# Patient Record
Sex: Male | Born: 1960 | Race: White | Hispanic: No | Marital: Married | Smoking: Never smoker
Health system: Southern US, Community
[De-identification: ages and names within clinical notes are randomized; demographics above are authoritative.]

## PROBLEM LIST (undated history)

## (undated) DIAGNOSIS — G4730 Sleep apnea, unspecified: Secondary | ICD-10-CM

## (undated) DIAGNOSIS — I10 Essential (primary) hypertension: Secondary | ICD-10-CM

## (undated) DIAGNOSIS — K2270 Barrett's esophagus without dysplasia: Secondary | ICD-10-CM

## (undated) DIAGNOSIS — J189 Pneumonia, unspecified organism: Secondary | ICD-10-CM

## (undated) DIAGNOSIS — E119 Type 2 diabetes mellitus without complications: Secondary | ICD-10-CM

## (undated) DIAGNOSIS — K219 Gastro-esophageal reflux disease without esophagitis: Secondary | ICD-10-CM

## (undated) HISTORY — DX: Gastro-esophageal reflux disease without esophagitis: K21.9

## (undated) HISTORY — DX: Type 2 diabetes mellitus without complications: E11.9

## (undated) HISTORY — DX: Morbid (severe) obesity due to excess calories: E66.01

---

## 1991-02-14 HISTORY — PX: OTHER SURGICAL HISTORY: SHX169

## 1999-02-14 HISTORY — PX: CHOLECYSTECTOMY: SHX55

## 2000-02-14 HISTORY — PX: CATARACT EXTRACTION, BILATERAL: SHX1313

## 2004-02-22 ENCOUNTER — Inpatient Hospital Stay: Payer: Self-pay | Admitting: Internal Medicine

## 2006-02-13 HISTORY — PX: LAPAROSCOPIC GASTRIC BANDING: SHX1100

## 2006-07-01 ENCOUNTER — Emergency Department: Payer: Self-pay | Admitting: Emergency Medicine

## 2010-02-13 HISTORY — PX: JOINT REPLACEMENT: SHX530

## 2010-04-25 ENCOUNTER — Ambulatory Visit: Payer: Self-pay | Admitting: Specialist

## 2010-05-02 ENCOUNTER — Ambulatory Visit: Payer: Self-pay | Admitting: Specialist

## 2010-05-23 ENCOUNTER — Ambulatory Visit: Payer: Self-pay | Admitting: Gastroenterology

## 2010-05-23 LAB — HM COLONOSCOPY

## 2011-03-31 ENCOUNTER — Encounter (INDEPENDENT_AMBULATORY_CARE_PROVIDER_SITE_OTHER): Payer: Self-pay | Admitting: Surgery

## 2011-03-31 ENCOUNTER — Ambulatory Visit (INDEPENDENT_AMBULATORY_CARE_PROVIDER_SITE_OTHER): Payer: BLUE CROSS/BLUE SHIELD | Admitting: Surgery

## 2011-03-31 VITALS — BP 162/92 | HR 70 | Temp 96.8°F | Resp 18 | Ht 76.0 in | Wt 340.2 lb

## 2011-03-31 DIAGNOSIS — Z9884 Bariatric surgery status: Secondary | ICD-10-CM

## 2011-03-31 DIAGNOSIS — E669 Obesity, unspecified: Secondary | ICD-10-CM

## 2011-03-31 DIAGNOSIS — E119 Type 2 diabetes mellitus without complications: Secondary | ICD-10-CM

## 2011-03-31 DIAGNOSIS — Z9089 Acquired absence of other organs: Secondary | ICD-10-CM

## 2011-03-31 DIAGNOSIS — I2699 Other pulmonary embolism without acute cor pulmonale: Secondary | ICD-10-CM

## 2011-03-31 DIAGNOSIS — Z9049 Acquired absence of other specified parts of digestive tract: Secondary | ICD-10-CM

## 2011-03-31 DIAGNOSIS — I456 Pre-excitation syndrome: Secondary | ICD-10-CM | POA: Insufficient documentation

## 2011-03-31 NOTE — Progress Notes (Signed)
Addended by: Latricia Heft on: 03/31/2011 02:05 PM   Modules accepted: Orders

## 2011-03-31 NOTE — Progress Notes (Signed)
Chief Complaint:  Diabetes Mellitus unresponsive to lapband.  Desires conversion to Roux en Y gastric bypass  History of Present Illness:  Howard Young is an 51 y.o. 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 He lost about 50 pounds initially but has failed to lose further weight. This is not help his diabetes resolve. Because of his frustration with weight loss and more importantly that his diabetes has not resolved he has wanted to have a gastric bypass. His primary care physician is Dr.Nancy Malmey.  He's is well aware of the procedure and its risks. He is going for a long-arm appropriate we'll process with [employer] but had lost some confidence in the group he was seen and [employer]. His procedure was done December 2008. His main issues in addition to diabetes include high cholesterol joint pain status post right hip replacement and hypertension.  History reviewed. No pertinent past medical history.  Past Surgical History  Procedure Date  . Cholecystectomy 2001  . Joint replacement 2012    right hip  . Laparoscopic gastric banding 2008  . Heart ablation 1993    Current Outpatient Prescriptions  Medication Sig Dispense Refill  . Aliskiren Fumarate (TEKTURNA PO) Take by mouth daily. Patient unsure of dosage.      Marland Kitchen atorvastatin (LIPITOR) 40 MG tablet Take 40 mg by mouth daily.      . insulin aspart (NOVOLOG) 100 UNIT/ML injection Inject into the skin 3 (three) times daily before meals. Patient taking 30 - 60 units daily      . Liraglutide (VICTOZA [place]) Inject into the skin daily.      . metFORMIN (GLUCOPHAGE) 500 MG tablet Take 500 mg by mouth 2 (two) times daily with a meal.      . Multiple Vitamins-Minerals (MULTIVITAMIN PO) Take by mouth daily.      . pantoprazole (PROTONIX) 40 MG tablet Daily.       Reglan Family History  Problem Relation Age of Onset  . Cancer Mother     breast, lung  . Cancer Father     colon  . Cancer  Sister     breast  . Cancer Sister     ovarian   Social History:   reports that he has never smoked. He has never used smokeless tobacco. He reports that he does not drink alcohol or use illicit drugs.   REVIEW OF SYSTEMS - PERTINENT POSITIVES ONLY: Noncontributory except as noted above.  Physical Exam:   Blood pressure 162/92, pulse 70, temperature 96.8 F (36 C), temperature source Temporal, resp. rate 18, height 6\' 4"  (1.93 m), weight 340 lb 3.2 oz (154.314 kg). Body mass index is 41.41 kg/(m^2).  Gen:  WDWN white male NAD  Neurological: Alert and oriented to person, place, and time. Motor and sensory function is grossly intact  Head: Normocephalic and atraumatic.  Eyes: Conjunctivae are normal. Pupils are equal, round, and reactive to light. No scleral icterus.  Neck: Normal range of motion. Neck supple. No tracheal deviation or thyromegaly present.  Cardiovascular:  SR without murmurs or gallops.  No carotid bruits Respiratory: Effort normal.  No respiratory distress. No chest wall tenderness. Breath sounds normal.  No wheezes, rales or rhonchi.  Abdomen:  Well-healed incisions. Lapband port implanted on fascia. GU: Musculoskeletal: Normal range of motion. Extremities are nontender. No cyanosis, edema or clubbing noted Lymphadenopathy: No cervical, preauricular, postauricular or axillary adenopathy is present Skin: Skin is  warm and dry. No rash noted. No diaphoresis. No erythema. No pallor. Pscyh: Normal mood and affect. Behavior is normal. Judgment and thought content normal.   LABORATORY RESULTS: No results found for this or any previous visit (from the past 48 hour(s)).  RADIOLOGY RESULTS: No results found.  Problem List: Patient Active Problem List  Diagnoses  . Obesity-BMI 41  . Status post gastric banding-APL 2008  . Pulmonary embolism-2005  . Diabetes mellitus-Type II  . WPW (Wolff-Parkinson-White syndrome)-cardiac ablation 1993  . S/P cholecystectomy-2001     Assessment & Plan: Morbid obesity and diabetes. Will plan to move him through our system toward Roux-en-Y gastric bypass.    [name], MD, [facility] [phone] beeper [phone]  03/31/2011 10:23 AM

## 2011-03-31 NOTE — Patient Instructions (Signed)
Followup with Howard Young for scheduling tests

## 2011-03-31 NOTE — Progress Notes (Signed)
Addended by: Latricia Heft on: 03/31/2011 10:42 AM   Modules accepted: Orders

## 2011-04-25 ENCOUNTER — Encounter: Payer: Self-pay | Admitting: *Deleted

## 2011-04-25 ENCOUNTER — Encounter (HOSPITAL_COMMUNITY)
Admission: RE | Disposition: A | Discharge status: Home Health | Payer: Self-pay | Source: Ambulatory Visit | Attending: Surgery

## 2011-04-25 ENCOUNTER — Ambulatory Visit (HOSPITAL_COMMUNITY)
Admission: RE | Admit: 2011-04-25 | Discharge: 2011-04-25 | Disposition: A | Discharge status: Home / Self Care | Payer: BC Managed Care – PPO | Source: Ambulatory Visit | Attending: Surgery | Admitting: Surgery

## 2011-04-25 ENCOUNTER — Ambulatory Visit (HOSPITAL_COMMUNITY)
Admission: RE | Admit: 2011-04-25 | Discharge: 2011-04-25 | Disposition: A | Discharge status: Home Health | Payer: BC Managed Care – PPO | Source: Ambulatory Visit | Attending: Surgery | Admitting: Surgery

## 2011-04-25 ENCOUNTER — Encounter: Payer: BC Managed Care – PPO | Attending: Surgery | Admitting: *Deleted

## 2011-04-25 DIAGNOSIS — I2699 Other pulmonary embolism without acute cor pulmonale: Secondary | ICD-10-CM

## 2011-04-25 DIAGNOSIS — Z713 Dietary counseling and surveillance: Secondary | ICD-10-CM | POA: Insufficient documentation

## 2011-04-25 DIAGNOSIS — Z01818 Encounter for other preprocedural examination: Secondary | ICD-10-CM | POA: Insufficient documentation

## 2011-04-25 DIAGNOSIS — Z01810 Encounter for preprocedural cardiovascular examination: Secondary | ICD-10-CM

## 2011-04-25 DIAGNOSIS — Z9884 Bariatric surgery status: Secondary | ICD-10-CM

## 2011-04-25 HISTORY — PX: BREATH TEK H PYLORI: SHX5422

## 2011-04-25 SURGERY — BREATH TEST, FOR HELICOBACTER PYLORI

## 2011-04-25 NOTE — Patient Instructions (Signed)
   Follow Pre-Op Nutrition Goals to prepare for Gastric Bypass Surgery.   Call the [facility] at [phone] once you have been given your surgery date to enrolled in the Pre-Op Nutrition Class. You will need to attend this nutrition class 3-4 weeks prior to your surgery. 

## 2011-04-25 NOTE — Progress Notes (Signed)
Bilateral lower extremity venous duplex completed.  Preliminary report is negative for DVT, SVT, or a Baker's cyst. 

## 2011-04-25 NOTE — Progress Notes (Signed)
  Pre-Op Assessment Visit:  Pre-Operative Gastric Bypass Surgery  Medical Nutrition Therapy:  Appt start time: 0915 end time:  1015.  Patient was seen on 04/25/2011 for Pre-Operative Gastric Bypass Nutrition Assessment. Assessment and letter of approval faxed to [facility] Bariatric Surgery Program coordinator on 04/25/2011.  Approval letter sent to [facility] and will be available in the chart under the media tab.  TANITA  BODY COMP RESULTS  04/25/11     %Fat 36.6%     FM (lbs) 123.5     FFM (lbs) 214.0     TBW (lbs) 156.6      Handouts given during visit include:  Pre-Op Goals Handout  Bariatric Protein Shakes handout  Bariatric Support Group schedule  Patient to call for Pre-Op and Post-Op Nutrition Education at the [facility] when surgery is scheduled.

## 2011-04-26 ENCOUNTER — Encounter (HOSPITAL_COMMUNITY): Payer: Self-pay | Admitting: Surgery

## 2011-11-04 ENCOUNTER — Ambulatory Visit: Payer: Self-pay | Admitting: Orthopedic Surgery

## 2011-11-29 ENCOUNTER — Encounter (INDEPENDENT_AMBULATORY_CARE_PROVIDER_SITE_OTHER): Payer: Self-pay | Admitting: Surgery

## 2011-11-29 ENCOUNTER — Ambulatory Visit (INDEPENDENT_AMBULATORY_CARE_PROVIDER_SITE_OTHER): Payer: BC Managed Care – PPO | Admitting: Surgery

## 2011-11-29 VITALS — BP 180/88 | HR 88 | Temp 97.4°F | Resp 20 | Ht 76.0 in | Wt 345.4 lb

## 2011-11-29 DIAGNOSIS — K9509 Other complications of gastric band procedure: Secondary | ICD-10-CM

## 2011-11-29 NOTE — Patient Instructions (Signed)
Thanks for your patience.  If you need further assistance after leaving the office, please call our office and speak with a [facility] nurse.  [phone].  If you want to leave a message for Dr. Reene Harlacher, please call his office phone at [phone]. 

## 2011-11-29 NOTE — Progress Notes (Signed)
Howard Young Body mass index is 42.04 kg/(m^2).  Having regurgitation:  no  Nocturnal reflux?  no  Amount of fill  1 cc  Howard Young returns today to discuss his lack of progress with weight loss and his lap band. When he began the process with Dr. Arlyce Dice he weighed over 385. He lost about 50 pounds and currently is tedious about 345. His diabetes is still a problem which he said since 2002. He works out at [employer] frequently including [employer] in  [place] and [employer] in [place] as well.  He has been frustrated with his inability to get his weight down and lose his diabetes mellitus as well.  We're seeking to get  insurance  Approval for removal of his lap band and conversion to a Roux-en-Y gastric bypass. I will try to get a letter to [facility] requesting a doctor to doctor discussion about this.

## 2012-01-04 ENCOUNTER — Ambulatory Visit (INDEPENDENT_AMBULATORY_CARE_PROVIDER_SITE_OTHER): Payer: BC Managed Care – PPO | Admitting: Surgery

## 2012-01-04 ENCOUNTER — Encounter (INDEPENDENT_AMBULATORY_CARE_PROVIDER_SITE_OTHER): Payer: Self-pay | Admitting: Surgery

## 2012-01-04 VITALS — BP 160/120 | HR 60 | Temp 97.3°F | Resp 12 | Ht 76.0 in | Wt 337.0 lb

## 2012-01-04 DIAGNOSIS — E8881 Metabolic syndrome: Secondary | ICD-10-CM | POA: Insufficient documentation

## 2012-01-04 NOTE — Patient Instructions (Signed)
Document gymnasium use. Printout online calorie counting if possible Ask Dr. Lorie Phenix to address your metabolic syndrome and the need for gastric bypass to specifically help in bringing resolution to this comorbidity

## 2012-01-04 NOTE — Progress Notes (Signed)
Howard Young presents today somewhat frustrated is tense to undergo a laparoscopic Roux-en-Y gastric bypass. His weight today is 337 and he has maintained about 50 pound weight loss since his surgery by Dr. Arlyce Dice. However despite his weight loss down to a BMI of 41 he still has metabolic syndrome. He has away circumference greater than 42 inches, hypertension (BP 160/120 today), and most importantly type 2 diabetes. I think a Roux-en-Y gastric bypass is a good operation for this condition specifically the type 2 diabetes. The new NIH. Guidelines issued last week indicates such and we will try to proceed to have him approved for Roux-en-Y gastric bypass as a metabolic operation to try to help control his diabetes mellitus. He is to see Dr. Lorie Phenix tomorrow and I wait to hear whether she concurs and this effort.  He may need more aggressive management of his hypertension.    Impression:  Metabolic syndrome with insufficient weight loss with laparoscopic gastric banding.

## 2012-02-14 HISTORY — PX: MOHS SURGERY: SUR867

## 2012-02-16 ENCOUNTER — Ambulatory Visit (INDEPENDENT_AMBULATORY_CARE_PROVIDER_SITE_OTHER): Payer: BC Managed Care – PPO | Admitting: Surgery

## 2012-02-16 ENCOUNTER — Encounter (INDEPENDENT_AMBULATORY_CARE_PROVIDER_SITE_OTHER): Payer: Self-pay | Admitting: Surgery

## 2012-02-16 VITALS — BP 132/88 | HR 72 | Temp 98.4°F | Resp 18 | Ht 76.0 in | Wt 340.8 lb

## 2012-02-16 DIAGNOSIS — E8881 Metabolic syndrome: Secondary | ICD-10-CM

## 2012-02-16 NOTE — Progress Notes (Signed)
Howard Young 52 y.o.  Body mass index is 41.48 kg/(m^2).  Patient Active Problem List  Diagnosis  . Obesity-BMI 41  . Status post gastric banding-APL 2008  . Pulmonary embolism-2005  . Diabetes mellitus-Type II-since 2001  . WPW (Wolff-Parkinson-White syndrome)-cardiac ablation 1993  . S/P cholecystectomy-2001  . Complication of gastric banding-insufficient weight loss  . Metabolic syndrome    Allergies  Allergen Reactions  . Metoclopramide Hcl Shortness Of Breath and Anxiety    Past Surgical History  Procedure Date  . Cholecystectomy 2001  . Joint replacement 2012    right hip  . Laparoscopic gastric banding 2008  . Heart ablation 1993  . Breath tek h pylori 04/25/2011    Procedure: BREATH TEK H PYLORI;  Surgeon: Valarie Merino, MD;  Location: [facility];  Service: General;  Laterality: N/ALorie Phenix, MD No diagnosis found.  Mr. Sparling needs gastric bypass surgery to deal with his DM and metabolic syndrome.  I have refilled his band and his weight has not changed.  He is clearly a lapband failure.    Will resubmit this request to his insurance for approval for removal of his lapband and  Conversion to Roux Y gastric bypass.     [name], MD, [facility] [phone] beeper [phone]  02/16/2012 5:38 PM

## 2012-02-16 NOTE — Patient Instructions (Signed)
Will submit request for approval for Roux Y gastric bypass conversion

## 2012-03-19 ENCOUNTER — Ambulatory Visit: Payer: Self-pay | Admitting: Gastroenterology

## 2012-03-29 ENCOUNTER — Ambulatory Visit: Payer: Self-pay | Admitting: Gastroenterology

## 2012-04-01 LAB — PATHOLOGY REPORT

## 2012-06-21 ENCOUNTER — Inpatient Hospital Stay: Payer: Self-pay | Admitting: Specialist

## 2012-06-21 LAB — CBC
HCT: 48.2 % (ref 40.0–52.0)
MCHC: 34.1 g/dL (ref 32.0–36.0)
MCV: 82 fL (ref 80–100)
RDW: 12.9 % (ref 11.5–14.5)

## 2012-06-21 LAB — COMPREHENSIVE METABOLIC PANEL
Albumin: 3.8 g/dL (ref 3.4–5.0)
Anion Gap: 9 (ref 7–16)
Calcium, Total: 9.5 mg/dL (ref 8.5–10.1)
Chloride: 100 mmol/L (ref 98–107)
Creatinine: 1.39 mg/dL — ABNORMAL HIGH (ref 0.60–1.30)
EGFR (African American): >60
EGFR (Non-African Amer.): 57 — ABNORMAL LOW
Glucose: 248 mg/dL — ABNORMAL HIGH (ref 65–99)
Osmolality: 278 (ref 275–301)
Potassium: 4.2 mmol/L (ref 3.5–5.1)
SGOT(AST): 65 U/L — ABNORMAL HIGH (ref 15–37)
SGPT (ALT): 85 U/L — ABNORMAL HIGH (ref 12–78)
Sodium: 134 mmol/L — ABNORMAL LOW (ref 136–145)
Total Protein: 8.4 g/dL — ABNORMAL HIGH (ref 6.4–8.2)

## 2012-06-21 LAB — URINALYSIS, COMPLETE
Bilirubin,UR: NEGATIVE
Glucose,UR: >=500 mg/dL (ref 0–75)
Ketone: NEGATIVE
Nitrite: NEGATIVE
Ph: 5 (ref 4.5–8.0)
Protein: NEGATIVE
RBC,UR: <1 /HPF (ref 0–5)
Squamous Epithelial: <1

## 2012-06-22 LAB — BASIC METABOLIC PANEL
Calcium, Total: 8.5 mg/dL (ref 8.5–10.1)
Co2: 28 mmol/L (ref 21–32)
EGFR (African American): >60
EGFR (Non-African Amer.): 53 — ABNORMAL LOW
Glucose: 197 mg/dL — ABNORMAL HIGH (ref 65–99)
Osmolality: 277 (ref 275–301)
Potassium: 4 mmol/L (ref 3.5–5.1)
Sodium: 135 mmol/L — ABNORMAL LOW (ref 136–145)

## 2012-06-22 LAB — URINE CULTURE

## 2012-06-22 LAB — CBC WITH DIFFERENTIAL/PLATELET
Basophil #: 0.1 10*3/uL (ref 0.0–0.1)
Basophil %: 0.4 %
Eosinophil %: 0.1 %
HCT: 42 % (ref 40.0–52.0)
Lymphocyte %: 4.8 %
MCH: 28.6 pg (ref 26.0–34.0)
MCV: 82 fL (ref 80–100)
Monocyte #: 1.8 x10 3/mm — ABNORMAL HIGH (ref 0.2–1.0)
Monocyte %: 7.2 %
Neutrophil #: 21.3 10*3/uL — ABNORMAL HIGH (ref 1.4–6.5)
Platelet: 166 10*3/uL (ref 150–440)
RBC: 5.11 10*6/uL (ref 4.40–5.90)
RDW: 13 % (ref 11.5–14.5)

## 2012-06-22 LAB — CREATININE, SERUM: EGFR (Non-African Amer.): 57 — ABNORMAL LOW

## 2012-06-23 LAB — BASIC METABOLIC PANEL
Anion Gap: 5 — ABNORMAL LOW (ref 7–16)
BUN: 11 mg/dL (ref 7–18)
Calcium, Total: 8.4 mg/dL — ABNORMAL LOW (ref 8.5–10.1)
Chloride: 105 mmol/L (ref 98–107)
Co2: 26 mmol/L (ref 21–32)
Creatinine: 1.19 mg/dL (ref 0.60–1.30)
EGFR (African American): >60
EGFR (Non-African Amer.): >60
Glucose: 158 mg/dL — ABNORMAL HIGH (ref 65–99)
Osmolality: 275 (ref 275–301)
Potassium: 3.7 mmol/L (ref 3.5–5.1)
Sodium: 136 mmol/L (ref 136–145)

## 2012-06-23 LAB — CBC WITH DIFFERENTIAL/PLATELET
Basophil #: 0.1 10*3/uL (ref 0.0–0.1)
Eosinophil %: 1 %
HGB: 14.1 g/dL (ref 13.0–18.0)
Lymphocyte #: 1.7 10*3/uL (ref 1.0–3.6)
MCHC: 34.3 g/dL (ref 32.0–36.0)
MCV: 82 fL (ref 80–100)
Monocyte #: 1.9 x10 3/mm — ABNORMAL HIGH (ref 0.2–1.0)
Monocyte %: 13.3 %
Neutrophil #: 10.3 10*3/uL — ABNORMAL HIGH (ref 1.4–6.5)
Platelet: 155 10*3/uL (ref 150–440)
RBC: 5.02 10*6/uL (ref 4.40–5.90)
RDW: 13 % (ref 11.5–14.5)
WBC: 14.1 10*3/uL — ABNORMAL HIGH (ref 3.8–10.6)

## 2012-06-24 LAB — CBC WITH DIFFERENTIAL/PLATELET
Basophil #: 0.1 10*3/uL (ref 0.0–0.1)
Basophil %: 0.6 %
Eosinophil #: 0.2 10*3/uL (ref 0.0–0.7)
HGB: 14.8 g/dL (ref 13.0–18.0)
Lymphocyte #: 1.6 10*3/uL (ref 1.0–3.6)
MCH: 28.6 pg (ref 26.0–34.0)
MCHC: 35.1 g/dL (ref 32.0–36.0)
MCV: 81 fL (ref 80–100)
Monocyte #: 1.3 x10 3/mm — ABNORMAL HIGH (ref 0.2–1.0)
Platelet: 179 10*3/uL (ref 150–440)
RDW: 12.9 % (ref 11.5–14.5)

## 2012-06-27 LAB — CULTURE, BLOOD (SINGLE)

## 2012-07-09 ENCOUNTER — Encounter (INDEPENDENT_AMBULATORY_CARE_PROVIDER_SITE_OTHER): Payer: Self-pay

## 2013-06-10 DIAGNOSIS — E291 Testicular hypofunction: Secondary | ICD-10-CM | POA: Insufficient documentation

## 2014-06-05 NOTE — Discharge Summary (Signed)
PATIENT NAME:  Howard Young, Howard Young MR#:  [id number] DATE OF BIRTH:  Sep 05, 1960  DATE OF ADMISSION:  06/21/2012 DATE OF DISCHARGE:  06/24/2012  For a detailed note, please take a look at the history and physical done on admission.   DIAGNOSES AT DISCHARGE: As follows: 1.  Acute left lower extremity cellulitis.  2.  Systemic inflammatory response syndrome secondary to the cellulitis.  3.  Diabetes. 4.  Acute renal failure. 5.  Hypertension.   DIET: The patient is being discharged on American diabetic Association, low sodium, low fat diet.   ACTIVITY: As tolerated.   FOLLOWUP: With Dr. Margarita Rana in the next 3 to 4 days.  DISCHARGE MEDICATIONS:  Tekturna 300 mg daily, Aldactone 50 mg b.i.d., Protonix 40 mg daily, metformin 1000 mg b.i.d., Lantus 30 units daily, Victoza 1.8 mg subcutaneous daily, vitamin B12 1000 mcg b.i.d., multivitamin daily, ginko biloba supplement 120 mg daily, St. John's wort 300 mg daily, glucosamine hydrochloride 1500 mg daily and Keflex 5 mg t.i.d. x 10 days.   PERTINENT STUDIES DONE DURING THE HOSPITAL COURSE: As follows: A Doppler of the left lower extremity showing no evidence of any acute DVT. Blood cultures noted to be negative.   HOSPITAL COURSE: This is a 54 year old male, who presented to the hospital with left lower extremity pain, redness and swelling and also low-grade fever and tachycardia.  1.  Acute left lower extremity cellulitis. This was likely the cause of the patient's redness, swelling and pain. The patient has a history of previous DVT and PE, therefore did undergo a Doppler of his lower extremity to rule out a DVT. The patient empirically was started on IV vancomycin. Blood cultures were obtained. The patient's clinical symptoms over the next 2 or 3  days have significantly improved. The tenderness and the warmth to the area has improved. The redness is still persistent though. His white cell count has completely normalized with IV antibiotic  therapy. His blood cultures have remained negative. One of his blood cultures grew out gram-positive cocci, but it was a coagulase-negative staph, which is a skin contaminant. The patient presently is clinically doing better with a normal white cell count and afebrile in the past 24 hours and is being discharged on p.o. Keflex for the next 10 days.  2.  Systemic inflammatory response syndrome. This was likely secondary to the left lower extremity cellulitis. The patient presented with rigors, tachycardia and fever. The patient's blood cultures have been consistent with only a contaminant. He has clinically improved a lot and is presently hemodynamically stable. He was empirically treated with IV vancomycin and is currently being discharged on p.o. Keflex as stated.  3.  Diabetes. The patient's blood sugars remained stable in the hospital. He was maintained on his Victoza, Lantus and sliding scale insulin. His metformin was held as he was in mild acute renal failure, although he can resume that upon discharge as his renal function has improved.  4.  Acute renal failure. This was likely secondary to dehydration and from the systemic inflammatory response syndrome. After getting aggressive IV fluids, the patient's renal function is now back down to baseline. His Aldactone and metformin were held upon discharge, although he will resume those since his renal function has come back to baseline.  5.  Hypertension. The patient was maintained on his Marisa Severin and will continue that.  6.  GERD. The patient was maintained on his Protonix. He will resume that. 7.  Leukocytosis. This was likely secondary to the  left lower extremity cellulitis. The patient's white cell count has normalized with IV antibiotic therapy.   CODE STATUS: The patient is a full code.   DISPOSITION: He is being discharged home.   TIME SPENT ON DISCHARGE: 40 minutes.  ____________________________ Belia Heman. Verdell Carmine, MD vjs:aw D: 06/24/2012  16:12:19 ET T: 06/25/2012 07:55:29 ET JOB#: [id number]  cc: Belia Heman. Verdell Carmine, MD, <Dictator> Jerrell Belfast, MD Henreitta Leber MD ELECTRONICALLY SIGNED 07/02/2012 19:58

## 2014-06-05 NOTE — H&P (Signed)
PATIENT NAME:  Howard Young, Howard Young MR#:  [id number] DATE OF BIRTH:  Sep 13, 1960  DATE OF ADMISSION:  06/21/2012  PRIMARY CARE PHYSICIAN: Margarita Rana, MD   CHIEF COMPLAINT: Left lower extremity pain and redness.   HISTORY OF PRESENT ILLNESS: This is a 54 year old male who presented to the hospital with left lower extremity redness and pain that started this morning. The patient says that when he woke up out of bed his left felt a little bit painful and also warm to touch. A few years back, the patient had a cramp in his right lower extremity and then he became acutely short of breath at that time. He was at that time diagnosed with a DVT and also an acute pulmonary embolism. He was a bit concerned, therefore came to the ER for further evaluation. At triage, the patient was noted to be tachycardic and he was also having some rigors.  He was noted to have a left lower extremity cellulitic-looking rash, noted to have a leukocytosis. Hospitalist services were contacted for further treatment and evaluation. The patient presently does complain of left lower extremity redness and pain, but no fevers, but positive rigors.  No nausea, no vomiting, no chest pain, no shortness of breath, no cough, no headache and no other associated symptoms presently.   REVIEW OF SYSTEMS: CONSTITUTIONAL: No documented fever, no weight gain, and no weight loss.  EYES: No blurred or double vision.  ENT: No tinnitus. No postnasal drip. No redness of the oropharynx.  RESPIRATORY: No cough, no wheeze, no hemoptysis, and no dyspnea.  CARDIOVASCULAR: No chest pain, no orthopnea, no palpitations, and no syncope.  GASTROINTESTINAL: No nausea, no vomiting, no diarrhea, no abdominal pain, and no melena or hematochezia.  GENITOURINARY: No dysuria or hematuria.  ENDOCRINE: No polyuria or nocturia. No heat or cold intolerance.  HEMATOLOGIC: No anemia, no bruising, and no bleeding.  INTEGUMENTARY: No rashes and no lesions.  MUSCULOSKELETAL:  No arthritis, no swelling, and no gout.  NEUROLOGIC: No numbness or tingling. No ataxia. No seizure-type activity.  PSYCHIATRIC: No anxiety, no insomnia, and no ADD.   PAST MEDICAL HISTORY: Consistent with diabetes, hypertension, GERD, obstructive sleep apnea, history of previous PE and DVT.   ALLERGIES: REGLAN.   SOCIAL HISTORY: No smoking, no alcohol abuse, and no illicit drug abuse. Lives at home with his wife.   FAMILY HISTORY: Both mother and father are deceased. Mother had diabetes, hypertension, and history of MI.  Father died from complications of colon cancer.   CURRENT MEDICATIONS:  1.  Victoza 1.8 mg subcutaneously daily. 2.  Metformin 1000 mg b.i.d. 3.  Lantus 30 units at bedtime. 4.  Glucosamine supplements 1500 mg daily. 5.  Kinko biloba supplemented daily. 6.  Multivitamin daily. 7.  Protonix 40 mg daily. 8.  Aldactone 50 mg b.i.d. 9.  Tekturna 300 mg daily. 10.  St. John's Wort 300 mg 1 cap daily. 11.  Vitamin B12 1000 mcg daily.   ADMISSION PHYSICAL EXAMINATION: VITAL SIGNS:  Temperature is 99.6, pulse 122, respirations 20, blood pressure 143/66, and sats 95% on room air.  GENERAL: The patient is a pleasant appearing male in no apparent distress.  HEENT: Atraumatic, normocephalic. Extraocular muscles are intact. Pupils are equal and reactive to light. Sclerae anicteric. No conjunctival injection. No pharyngeal erythema.  NECK: Supple. There is no jugular venous distention. No bruits, lymphadenopathy or thyromegaly.  HEART: Regular rate and rhythm, tachycardic. No murmurs, no rubs and no clicks.  LUNGS: Clear to auscultation bilaterally. No  rales or rhonchi. No wheezes.  ABDOMEN: Soft, flat, nontender and nondistended. Has good bowel sounds. No hepatosplenomegaly appreciated.  EXTREMITIES: No evidence of any cyanosis, clubbing or peripheral edema. Has +2 pedal and radial pulses bilaterally.  NEUROLOGIC: The patient is alert, awake and oriented x 3 with no focal motor  or sensory deficits appreciated bilaterally.  SKIN: Moist and warm. The patient does have a red warm area on the left shin and left lower extremity consistent with a cellulitis.  LYMPHATIC: There is no cervical or axillary lymphadenopathy.   LABORATORY AND DIAGNOSTICS:  Show serum glucose of 248, BUN 17, creatinine 1.3, sodium 134, potassium 4.2, chloride 100 and bicarbonate of 25.  The patient's LFTs showed a mild elevation of AST of 65, ALT of 85 and albumin 3.8. Troponin less than 0.02. White cell count 21.7, hemoglobin 16.4, hematocrit 48.2 and platelet count 214. Urinalysis is within normal limits.   The patient did have a chest x-ray done which showed no evidence of any acute cardiopulmonary disease. The patient also had a Doppler of his left lower extremity showing no evidence of an acute DVT.   ASSESSMENT AND PLAN: This is a 54 year old male with history of diabetes, hypertension, history of previous pulmonary embolus and deep venous thrombosis, gastroesophageal reflux disease and obstructive sleep apnea who presents to the hospital due to left leg pain and redness and noted to have a left lower extremity cellulitis.  1.  Left lower extremity cellulitis. This is likely the source of the patient's pain, tachycardia and leukocytosis. This the likely cause of systemic antiinflammatory response syndrome. I will treat the patient with IV vancomycin, follow blood cultures. The patient's Dopplers of his lower extremities are negative for deep vein thrombosis, as he has a history of it.  2.  Systemic inflammatory response syndrome. I suspect this is secondary to the left lower extremity cellulitis. The patient presented with rigors, tachycardia, leukocytosis and elevated lactate.  For now I will give the patient aggressive IV fluids, give him IV antibiotics, follow hemodynamics, follow blood cultures.  3.  Diabetes.  Place him on a carb-controlled diet, continue his Victoza, Lantus and sliding scale  insulin for now.  Hold metformin given acute renal failure.  4.  Acute renal failure. I suspect this is secondary to the systemic inflammatory response syndrome and sepsis. I will hydrate the patient with intravenous fluids, follow BUN and creatinine, urine output, renal dose medications and avoid nephrotoxins. I will hold his metformin and his Aldactone for now.  5.  Hypertension. Continue Tekturna. 6.  Gastroesophageal reflux disease. Continue Protonix. 7.  Leukocytosis.  Likely due to lower extremity cellulitis. I will follow white cell count after treatment with IV antibiotics. 8.  History of pulmonary embolus and deep vein thrombosis. The patient's left lower extremity Dopplers are negative for deep venous thrombosis.  The suspicion for pulmonary embolism is less as the patient is not hypoxic. If the patient continues to be hypoxic and persistently tachycardic despite treatment of the systemic antiinflammatory response syndrome and sepsis, I will consider getting a CT of his chest to rule out a pulmonary embolus.   The patient is a FULL CODE. The plan was discussed with the patient.  He is in agreement.   TIME SPENT: 50 minutes.  ____________________________ Belia Heman. Verdell Carmine, MD vjs:sb D: 06/21/2012 10:13:54 ET T: 06/21/2012 10:28:39 ET JOB#: [id number]  cc: Belia Heman. Verdell Carmine, MD, <Dictator> Henreitta Leber MD ELECTRONICALLY SIGNED 07/02/2012 19:57

## 2014-06-12 ENCOUNTER — Other Ambulatory Visit
Admit: 2014-06-12 | Disposition: A | Discharge status: Home / Self Care | Payer: Self-pay | Attending: Family Medicine | Admitting: Family Medicine

## 2014-06-12 LAB — COMPREHENSIVE METABOLIC PANEL
ALBUMIN: 4 g/dL
ALK PHOS: 39 U/L
Anion Gap: 7 (ref 7–16)
BUN: 16 mg/dL
Bilirubin,Total: 1.3 mg/dL — ABNORMAL HIGH
CHLORIDE: 101 mmol/L
Calcium, Total: 8.9 mg/dL
Co2: 27 mmol/L
Creatinine: 1.38 mg/dL — ABNORMAL HIGH
EGFR (African American): >60
EGFR (Non-African Amer.): 57 — ABNORMAL LOW
GLUCOSE: 135 mg/dL — AB
Potassium: 4 mmol/L
SGOT(AST): 72 U/L — ABNORMAL HIGH
SGPT (ALT): 63 U/L
Sodium: 135 mmol/L
TOTAL PROTEIN: 7.5 g/dL

## 2014-06-12 LAB — TSH: Thyroid Stimulating Horm: 2.596 u[IU]/mL

## 2014-06-12 LAB — CBC WITH DIFFERENTIAL/PLATELET
BASOS ABS: 0.1 10*3/uL (ref 0.0–0.1)
Basophil %: 1 %
EOS PCT: 1.5 %
Eosinophil #: 0.1 10*3/uL (ref 0.0–0.7)
HCT: 47.8 % (ref 40.0–52.0)
HGB: 16.1 g/dL (ref 13.0–18.0)
LYMPHS ABS: 2 10*3/uL (ref 1.0–3.6)
Lymphocyte %: 24.7 %
MCH: 28.4 pg (ref 26.0–34.0)
MCHC: 33.7 g/dL (ref 32.0–36.0)
MCV: 84 fL (ref 80–100)
MONOS PCT: 10 %
Monocyte #: 0.8 x10 3/mm (ref 0.2–1.0)
Neutrophil #: 5 10*3/uL (ref 1.4–6.5)
Neutrophil %: 62.8 %
Platelet: 211 10*3/uL (ref 150–440)
RBC: 5.67 10*6/uL (ref 4.40–5.90)
RDW: 13 % (ref 11.5–14.5)
WBC: 7.9 10*3/uL (ref 3.8–10.6)

## 2014-06-12 LAB — LIPID PANEL
Cholesterol: 196 mg/dL
HDL Cholesterol: 41 mg/dL
LDL CHOLESTEROL, CALC: 124 mg/dL — AB
Triglycerides: 157 mg/dL — ABNORMAL HIGH
VLDL CHOLESTEROL, CALC: 31 mg/dL

## 2014-06-12 LAB — HEMOGLOBIN A1C: Hemoglobin A1C: 8.5 % — ABNORMAL HIGH

## 2014-06-25 ENCOUNTER — Other Ambulatory Visit: Payer: Self-pay | Admitting: Orthopedic Surgery

## 2014-06-25 DIAGNOSIS — M25511 Pain in right shoulder: Secondary | ICD-10-CM

## 2014-07-07 ENCOUNTER — Ambulatory Visit
Admission: RE | Admit: 2014-07-07 | Discharge: 2014-07-07 | Disposition: A | Discharge status: Home / Self Care | Payer: 59 | Source: Ambulatory Visit | Attending: Orthopedic Surgery | Admitting: Orthopedic Surgery

## 2014-07-07 DIAGNOSIS — M25511 Pain in right shoulder: Secondary | ICD-10-CM

## 2014-07-16 ENCOUNTER — Other Ambulatory Visit: Payer: Self-pay

## 2014-07-23 ENCOUNTER — Other Ambulatory Visit: Payer: Self-pay | Admitting: Family Medicine

## 2014-07-23 DIAGNOSIS — E119 Type 2 diabetes mellitus without complications: Secondary | ICD-10-CM

## 2014-07-28 ENCOUNTER — Other Ambulatory Visit: Payer: Self-pay | Admitting: Family Medicine

## 2014-07-28 DIAGNOSIS — E119 Type 2 diabetes mellitus without complications: Secondary | ICD-10-CM

## 2014-07-28 MED ORDER — ONETOUCH ULTRA MINI W/DEVICE KIT: 1.0000 | PACK | Freq: Every day | Status: DC

## 2014-07-28 MED ORDER — ONETOUCH LANCETS MISC: Status: DC

## 2014-07-28 MED ORDER — GLUCOSE BLOOD VI STRP: ORAL_STRIP | Status: DC

## 2014-07-28 NOTE — Telephone Encounter (Signed)
Pt wife, Barbera Setters states per [employer] pt will need a Rx for a One Touch glucose meter and test strips.  Pt is testing 2 times a day.  [place] GM#[phone]/[phone]

## 2014-08-22 ENCOUNTER — Other Ambulatory Visit: Payer: Self-pay | Admitting: Family Medicine

## 2014-08-22 DIAGNOSIS — E119 Type 2 diabetes mellitus without complications: Secondary | ICD-10-CM

## 2014-08-31 ENCOUNTER — Telehealth: Payer: Self-pay | Admitting: Family Medicine

## 2014-08-31 DIAGNOSIS — M25511 Pain in right shoulder: Secondary | ICD-10-CM | POA: Insufficient documentation

## 2014-08-31 NOTE — Telephone Encounter (Signed)
Last saw pt on 06/24/2014 for acute shoulder pain, Dx M25.511. Renaldo Fiddler, CMA

## 2014-08-31 NOTE — Telephone Encounter (Signed)
Pt is requesting a referral to see Arsenio Katz at [facility] for shoulder pain.  Estill Bamberg Wright's phone # is [phone].  CB# for pt is [phone]

## 2014-08-31 NOTE — Telephone Encounter (Signed)
Ok to refer. Not sure how to do this however.

## 2014-09-15 ENCOUNTER — Encounter: Payer: Self-pay | Admitting: Family Medicine

## 2014-09-15 ENCOUNTER — Ambulatory Visit (INDEPENDENT_AMBULATORY_CARE_PROVIDER_SITE_OTHER): Payer: 59 | Admitting: Family Medicine

## 2014-09-15 ENCOUNTER — Other Ambulatory Visit: Payer: Self-pay

## 2014-09-15 VITALS — BP 136/82 | HR 74 | Temp 97.9°F | Resp 16 | Wt 330.6 lb

## 2014-09-15 DIAGNOSIS — F419 Anxiety disorder, unspecified: Secondary | ICD-10-CM | POA: Insufficient documentation

## 2014-09-15 DIAGNOSIS — R002 Palpitations: Secondary | ICD-10-CM | POA: Diagnosis not present

## 2014-09-15 DIAGNOSIS — E1140 Type 2 diabetes mellitus with diabetic neuropathy, unspecified: Secondary | ICD-10-CM | POA: Insufficient documentation

## 2014-09-15 DIAGNOSIS — E349 Endocrine disorder, unspecified: Secondary | ICD-10-CM | POA: Insufficient documentation

## 2014-09-15 DIAGNOSIS — I2699 Other pulmonary embolism without acute cor pulmonale: Secondary | ICD-10-CM | POA: Insufficient documentation

## 2014-09-15 DIAGNOSIS — M542 Cervicalgia: Secondary | ICD-10-CM | POA: Diagnosis not present

## 2014-09-15 DIAGNOSIS — R748 Abnormal levels of other serum enzymes: Secondary | ICD-10-CM | POA: Insufficient documentation

## 2014-09-15 DIAGNOSIS — E781 Pure hyperglyceridemia: Secondary | ICD-10-CM | POA: Insufficient documentation

## 2014-09-15 DIAGNOSIS — G4730 Sleep apnea, unspecified: Secondary | ICD-10-CM | POA: Insufficient documentation

## 2014-09-15 DIAGNOSIS — K7581 Nonalcoholic steatohepatitis (NASH): Secondary | ICD-10-CM | POA: Insufficient documentation

## 2014-09-15 DIAGNOSIS — I10 Essential (primary) hypertension: Secondary | ICD-10-CM | POA: Insufficient documentation

## 2014-09-15 NOTE — Patient Instructions (Signed)
We will call you with lab results. Try two Aleve twice daily for neck discomfort and update your dental visit.

## 2014-09-15 NOTE — Progress Notes (Signed)
Subjective:     Patient ID: Howard Young, male   DOB: [birth year], 53 y.o.   MRN: [id number]  HPI  Chief Complaint  Patient presents with  . Jaw Pain    pain radiates into left side of neck area. Patient states he noticed that the area is sensitive to the touch on Friday  States his diabetes is under control. He does tend to sleep on his left side due to prior right shoulder rotator cuff injury. Continues to participate in competitive power lifting and did so recently. No recent dental visits. Also reports 5 minutes of palpitations 3 x week when he first lies down in bed. No associated shortness of breath or chest pain. Hx of OSA with C-Pap @ 11 cm/water and WPW ablation.   Review of Systems  Constitutional: Negative for fever and chills.  HENT: Negative for sore throat.        Objective:   Physical Exam  Constitutional: He appears well-developed and well-nourished. No distress.  HENT:  Mouth/Throat: No oral lesions (no tenderness on percussion of his left lower teeth).  Musculoskeletal:  Cervical FROM. Mild tenderness left anterior cervical area  Lymphadenopathy:    He has no cervical adenopathy.       Assessment:    1. Palpitations - Renal function panel - EKG 12-Lead: no change from prior tracing 2013.  2. Neck pain on left side     Plan:    Update dental exam and try nsaid's. Probable cardiology evaluation pending lab work.

## 2014-09-17 ENCOUNTER — Telehealth: Payer: Self-pay

## 2014-09-17 ENCOUNTER — Other Ambulatory Visit
Admission: RE | Admit: 2014-09-17 | Discharge: 2014-09-17 | Disposition: A | Discharge status: Home / Self Care | Payer: 59 | Source: Ambulatory Visit | Attending: Family Medicine | Admitting: Family Medicine

## 2014-09-17 DIAGNOSIS — R002 Palpitations: Secondary | ICD-10-CM | POA: Diagnosis present

## 2014-09-17 LAB — RENAL FUNCTION PANEL
ALBUMIN: 4.2 g/dL (ref 3.5–5.0)
ANION GAP: 8 (ref 5–15)
BUN: 19 mg/dL (ref 6–20)
CHLORIDE: 99 mmol/L — AB (ref 101–111)
CO2: 29 mmol/L (ref 22–32)
CREATININE: 1.16 mg/dL (ref 0.61–1.24)
Calcium: 9.5 mg/dL (ref 8.9–10.3)
GFR calc Af Amer: >60 mL/min (ref >60)
GLUCOSE: 180 mg/dL — AB (ref 65–99)
PHOSPHORUS: 3 mg/dL (ref 2.5–4.6)
Potassium: 4.3 mmol/L (ref 3.5–5.1)
Sodium: 136 mmol/L (ref 135–145)

## 2014-09-17 NOTE — Telephone Encounter (Signed)
-----   Message from Carmon Ginsberg, [place] sent at 09/17/2014  1:25 PM EDT ----- Your labs are ok. Dr. Venia Minks suggests cardiology consult if still having palpitations. Do you wish to proceed?

## 2014-09-17 NOTE — Telephone Encounter (Signed)
[id number] 

## 2014-09-17 NOTE — Telephone Encounter (Signed)
Spoke with patient on the phone and he states that his palpitations have stopped and that it has been coming on and off but he hasnt had a episode for a few days. Patient states that at this time he wants to hold off on cardiology consult and if he experiences palpitations again he will contact the office and then proceed with referral.KW

## 2014-09-24 ENCOUNTER — Telehealth: Payer: Self-pay | Admitting: Family Medicine

## 2014-09-24 DIAGNOSIS — F419 Anxiety disorder, unspecified: Secondary | ICD-10-CM

## 2014-09-24 MED ORDER — ALPRAZOLAM 1 MG PO TABS: ORAL_TABLET | ORAL | Status: DC

## 2014-09-24 NOTE — Telephone Encounter (Signed)
Printed rx and wrote note that patient has sleep apnea.   Thanks.

## 2014-09-24 NOTE — Telephone Encounter (Signed)
Last time his Xanax was filled was 11/23/2011 and was ordered:  Xanax 1mg  1/2 to 1 two times a day as needed.

## 2014-09-24 NOTE — Telephone Encounter (Signed)
Pt advised per Dr. Venia Minks this is done and ready-aa

## 2014-09-24 NOTE — Telephone Encounter (Signed)
Pt stated he is going out of town for work this weekend and he needs a letter stating that he has sleep apnea and would also like a refill for a few Xanax because he is required to fly to [place]. Pt would like it sent to [facility]. Thanks TNP

## 2014-10-24 ENCOUNTER — Other Ambulatory Visit: Payer: Self-pay | Admitting: Family Medicine

## 2014-10-24 DIAGNOSIS — E119 Type 2 diabetes mellitus without complications: Secondary | ICD-10-CM

## 2014-11-22 ENCOUNTER — Other Ambulatory Visit: Payer: Self-pay | Admitting: Family Medicine

## 2014-11-22 DIAGNOSIS — I10 Essential (primary) hypertension: Secondary | ICD-10-CM

## 2014-11-27 ENCOUNTER — Encounter: Payer: Self-pay | Admitting: Family Medicine

## 2014-11-27 ENCOUNTER — Ambulatory Visit (INDEPENDENT_AMBULATORY_CARE_PROVIDER_SITE_OTHER): Payer: 59 | Admitting: Family Medicine

## 2014-11-27 VITALS — BP 128/88 | HR 84 | Temp 98.5°F | Resp 16 | Wt 334.0 lb

## 2014-11-27 DIAGNOSIS — S90851A Superficial foreign body, right foot, initial encounter: Secondary | ICD-10-CM

## 2014-11-27 DIAGNOSIS — E1340 Other specified diabetes mellitus with diabetic neuropathy, unspecified: Secondary | ICD-10-CM

## 2014-11-27 NOTE — Progress Notes (Signed)
 Subjective:    Patient ID: Howard Young, male    DOB: 08/12/1960, 54 y.o.   MRN: [id number]  Foreign Body The incident occurred 3 to 5 days ago. Suspected object: Glass. Intake: Right foot. Pertinent negatives include no fever.  Has noticed some discomfort with walking it, but not enough to limit what he does. Does have numbness in his feet.  Noticed blood with walking. Wife did have broken glass in kitchen where he was walking. No systemic symptoms.     Patient Active Problem List   Diagnosis Date Noted  . Anxiety 09/15/2014  . Elevated CK 09/15/2014  . BP (high blood pressure) 09/15/2014  . Hypertriglyceridemia 09/15/2014  . Hypotestosteronism 09/15/2014  . NASH (nonalcoholic steatohepatitis) 09/15/2014  . Adiposity 09/15/2014  . PE (pulmonary embolism) 09/15/2014  . Diabetes mellitus, type 2 (HCC) 09/15/2014  . Apnea, sleep 09/15/2014  . Right shoulder pain 08/31/2014  . Male hypogonadism 06/10/2013  . 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 Metabolic syndrome 01/04/2012  . Complication of gastric banding-insufficient weight loss 11/29/2011  . Obesity-BMI 41 03/31/2011  . Status post gastric banding-APL 2008 03/31/2011  . Pulmonary embolism-2005 03/31/2011  . Diabetes mellitus (HCC) 03/31/2011  . WPW (Wolff-Parkinson-White syndrome)-cardiac ablation 1993 03/31/2011  . S/P cholecystectomy-2001 03/31/2011   Family History  Problem Relation Age of Onset  . Cancer Mother     breast, lung  . Cancer Father     colon  . Cancer Sister     breast  . Cancer Sister     ovarian   Social History   Social History  . Marital Status: Married    Spouse Name: N/A  . Number of Children: N/A  . Years of Education: N/A   Occupational History  . Not on file.   Social History Main Topics  . Smoking status: Never Smoker   . Smokeless tobacco: Never Used  . Alcohol Use: No  . Drug Use: No  . Sexual Activity: Not on file   Other Topics Concern  . Not on file   Social  History Narrative   Past Surgical History  Procedure Laterality Date  . Cholecystectomy  2001  . Joint replacement  2012    right hip  . Laparoscopic gastric banding  2008  . Heart ablation  1993  . Breath tek h pylori  04/25/2011    Procedure: BREATH TEK H PYLORI;  Surgeon: Matthew B Martin, MD;  Location: [facility];  Service: General;  Laterality: N/A;   Allergies  Allergen Reactions  . Metoclopramide Hcl Shortness Of Breath and Anxiety   Previous Medications   ALPRAZOLAM (XANAX) 1 MG TABLET    Take 1/2 to 1 two times a day as needed.   BLOOD GLUCOSE MONITORING SUPPL (ONE TOUCH ULTRA MINI) W/DEVICE KIT    1 Device by Does not apply route daily. To check blood sugar twice a day.   CYANOCOBALAMIN 100 MCG TABLET    Take 2 tablets by mouth 2 (two) times daily.   GLUCOSE BLOOD (ONE TOUCH ULTRA TEST) TEST STRIP    To check blood sugar twice a day.   INSULIN GLARGINE (LANTUS) 100 UNIT/ML INJECTION    Inject 60 Units into the skin at bedtime.   LISINOPRIL-HYDROCHLOROTHIAZIDE (PRINZIDE,ZESTORETIC) 20-12.5 MG TABLET    TAKE ONE TABLET BY MOUTH AT BEDTIME   METFORMIN (GLUCOPHAGE) 500 MG TABLET    Take 500 mg by mouth 2 (two) times daily with a meal.   MULTIPLE VITAMINS-MINERALS (MULTIVITAMIN PO)      Take by mouth daily.   NOVOLIN R RELION 100 UNIT/ML INJECTION    INJECT 30 UNITS SQ A BEDTIME   ONE TOUCH LANCETS MISC    To check blood sugar twice a day.   PANTOPRAZOLE (PROTONIX) 40 MG TABLET    Daily.   RELION INSULIN SYRINGE 29G X 1/2" 1 ML MISC    USE ONE SYRINGE ONCE DAILY   SPIRONOLACTONE (ALDACTONE) 50 MG TABLET    Take 50 mg by mouth daily.   There were no vitals taken for this visit.    Review of Systems  Constitutional: Negative for fever, chills, diaphoresis, activity change, appetite change, fatigue and unexpected weight change.  Musculoskeletal: Positive for myalgias (Right foot pain). Negative for back pain, joint swelling, arthralgias, gait problem, neck pain and neck  stiffness.       Objective:   Physical Exam  Constitutional: He appears well-developed and well-nourished.  Skin: Skin is warm and dry.  Does have laceration on right foot.  About 6 mm. Palpable glass shard.  Extracted with forceps. Patient tolerated removal well with good relief of symptoms.     BP 128/88 mmHg  Pulse 84  Temp(Src) 98.5 F (36.9 C) (Oral)  Resp 16  Wt 334 lb (151.501 kg)      Assessment & Plan:  1. Foreign body in foot, right, initial encounter Removed today.  Monitor for infection.   2. 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 Discussed making sure he checks his feet regularly.    Nancy Maloney, MD   

## 2014-11-29 ENCOUNTER — Encounter: Payer: Self-pay | Admitting: Emergency Medicine

## 2014-11-29 ENCOUNTER — Emergency Department
Admission: EM | Admit: 2014-11-29 | Discharge: 2014-11-29 | Disposition: A | Discharge status: Home / Self Care | Payer: 59 | Attending: Emergency Medicine | Admitting: Emergency Medicine

## 2014-11-29 ENCOUNTER — Emergency Department: Payer: 59

## 2014-11-29 DIAGNOSIS — I10 Essential (primary) hypertension: Secondary | ICD-10-CM | POA: Insufficient documentation

## 2014-11-29 DIAGNOSIS — Z79899 Other long term (current) drug therapy: Secondary | ICD-10-CM | POA: Insufficient documentation

## 2014-11-29 DIAGNOSIS — W25XXXD Contact with sharp glass, subsequent encounter: Secondary | ICD-10-CM | POA: Insufficient documentation

## 2014-11-29 DIAGNOSIS — L03115 Cellulitis of right lower limb: Secondary | ICD-10-CM | POA: Diagnosis not present

## 2014-11-29 DIAGNOSIS — E1140 Type 2 diabetes mellitus with diabetic neuropathy, unspecified: Secondary | ICD-10-CM | POA: Insufficient documentation

## 2014-11-29 DIAGNOSIS — S91331D Puncture wound without foreign body, right foot, subsequent encounter: Secondary | ICD-10-CM | POA: Diagnosis present

## 2014-11-29 DIAGNOSIS — Z794 Long term (current) use of insulin: Secondary | ICD-10-CM | POA: Diagnosis not present

## 2014-11-29 HISTORY — DX: Essential (primary) hypertension: I10

## 2014-11-29 MED ORDER — CEPHALEXIN 500 MG PO CAPS: 500.0000 mg | ORAL_CAPSULE | Freq: Two times a day (BID) | ORAL | Status: DC

## 2014-11-29 MED ORDER — SULFAMETHOXAZOLE-TRIMETHOPRIM 800-160 MG PO TABS: 1.0000 | ORAL_TABLET | Freq: Two times a day (BID) | ORAL | Status: DC

## 2014-11-29 NOTE — Discharge Instructions (Signed)

## 2014-11-29 NOTE — ED Notes (Signed)
Pt stepped on glass on Tues or Wed and went to PCP to have it removed on Friday.  Now pain in right foot and redness noted where glass was.  Has hx of cellulitis.

## 2014-11-29 NOTE — ED Provider Notes (Signed)
[facility] Emergency Department Provider Note  ____________________________________________  Time seen: On arrival  I have reviewed the triage vital signs and the nursing notes.   HISTORY  Chief Complaint Foot Pain    HPI Howard Young is a 54 y.o. male who presents with moderate burning pain in the right foot. Patient reports he is a diabetic and stepped on a piece of glass approximately 5 days ago, he had glass removed by his PCP approximately 2 days ago and has since developed a redness and increasing pain in his right foot. He has not had fevers or chills. He doesn't history of saline as in the past. He reports compliance with his diabetic medications. He is confident that his PCP removed for piece of glass     Past Medical History  Diagnosis Date  . Morbid obesity ([place])   . Diabetes mellitus without complication ([place])   . GERD (gastroesophageal reflux disease)   . Hypertension     Patient Active Problem List   Diagnosis Date Noted  . Anxiety 09/15/2014  . Elevated CK 09/15/2014  . BP (high blood pressure) 09/15/2014  . Hypertriglyceridemia 09/15/2014  . Hypotestosteronism 09/15/2014  . NASH (nonalcoholic steatohepatitis) 09/15/2014  . Adiposity 09/15/2014  . PE (pulmonary embolism) 09/15/2014  . Diabetes mellitus with diabetic neuropathy ([place]) 09/15/2014  . Apnea, sleep 09/15/2014  . Right shoulder pain 08/31/2014  . Male hypogonadism 06/10/2013  . 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 Metabolic syndrome 24/82/5003  . Complication of gastric banding-insufficient weight loss 11/29/2011  . Obesity-BMI 41 03/31/2011  . Status post gastric banding-APL 2008 03/31/2011  . Pulmonary embolism-2005 03/31/2011  . WPW (Wolff-Parkinson-White syndrome)-cardiac ablation 1993 03/31/2011  . S/P cholecystectomy-2001 03/31/2011    Past Surgical History  Procedure Laterality Date  . Cholecystectomy  2001  . Joint replacement  2012    right hip  .  Laparoscopic gastric banding  2008  . Heart ablation  1993  . Breath tek h pylori  04/25/2011    Procedure: BREATH TEK H PYLORI;  Surgeon: Pedro Earls, MD;  Location: [facility];  Service: General;  Laterality: N/A;    Current Outpatient Rx  Name  Route  Sig  Dispense  Refill  . ALPRAZolam (XANAX) 1 MG tablet      Take 1/2 to 1 two times a day as needed.   20 tablet   0   . Blood Glucose Monitoring Suppl (ONE TOUCH ULTRA MINI) W/DEVICE KIT   Does not apply   1 Device by Does not apply route daily. To check blood sugar twice a day.   1 each   0   . cyanocobalamin 100 MCG tablet   Oral   Take 2 tablets by mouth 2 (two) times daily.         Marland Kitchen glucose blood (ONE TOUCH ULTRA TEST) test strip      To check blood sugar twice a day.   200 each   3   . insulin glargine (LANTUS) 100 UNIT/ML injection   Subcutaneous   Inject 60 Units into the skin at bedtime.         Marland Kitchen lisinopril-hydrochlorothiazide (PRINZIDE,ZESTORETIC) 20-12.5 MG tablet      TAKE ONE TABLET BY MOUTH AT BEDTIME   30 tablet   5   . metFORMIN (GLUCOPHAGE) 500 MG tablet   Oral   Take 500 mg by mouth 2 (two) times daily with a meal.         . Multiple  Vitamins-Minerals (MULTIVITAMIN PO)   Oral   Take by mouth daily.         Marland Kitchen NOVOLIN R RELION 100 UNIT/ML injection      INJECT 30 UNITS SQ A BEDTIME   10 vial   5   . ONE TOUCH LANCETS MISC      To check blood sugar twice a day.   200 each   3   . pantoprazole (PROTONIX) 40 MG tablet      Daily.         Marland Kitchen RELION INSULIN SYRINGE 29G X 1/2" 1 ML MISC      USE ONE SYRINGE ONCE DAILY   100 each   3     DX:  E11.9   . spironolactone (ALDACTONE) 50 MG tablet   Oral   Take 50 mg by mouth daily.           Allergies Metoclopramide hcl  Family History  Problem Relation Age of Onset  . Cancer Mother     breast, lung  . Cancer Father     colon  . Cancer Sister     breast  . Cancer Sister     ovarian    Social  History Social History  Substance Use Topics  . Smoking status: Never Smoker   . Smokeless tobacco: Never Used  . Alcohol Use: No    Review of Systems  Constitutional: Negative for fever. Eyes: Negative for visual changes. ENT: Negative for sore throat Cardiovascular: Negative for chest pain. Respiratory: Negative for shortness of breath. Gastrointestinal: Negative for abdominal pain, vomiting and diarrhea. Genitourinary: Negative for dysuria. Musculoskeletal: Negative for back pain. Positive for foot pain Skin: Positive for foot rash Neurological: Negative for headaches  Psychiatric: No anxiety    ____________________________________________   PHYSICAL EXAM:  VITAL SIGNS: ED Triage Vitals  Enc Vitals Group     BP 11/29/14 1145 159/90 mmHg     Pulse Rate 11/29/14 1145 79     Resp 11/29/14 1145 18     Temp 11/29/14 1145 98.1 F (36.7 C)     Temp Source 11/29/14 1145 Oral     SpO2 11/29/14 1145 97 %     Weight 11/29/14 1145 320 lb (145.151 kg)     Height 11/29/14 1145 _0  (1.93 m)     Head Cir --      Peak Flow --      Pain Score 11/29/14 1148 0     Pain Loc --      Pain Edu? --      Excl. in [place]? --      Constitutional: Alert and oriented. Well appearing and in no distress. Eyes: Conjunctivae are normal.  ENT   Head: Normocephalic and atraumatic.   Mouth/Throat: Mucous membranes are moist. Cardiovascular: Normal rate, regular rhythm. Normal and symmetric distal pulses are present in all extremities. No murmurs, rubs, or gallops. Respiratory: Normal respiratory effort without tachypnea nor retractions. Breath sounds are clear and equal bilaterally.  Gastrointestinal: Soft and non-tender in all quadrants. No distention. There is no CVA tenderness. Genitourinary: deferred Musculoskeletal: Nontender with normal range of motion in all extremities. Plantar surface of right forefoot shows erythema spreading away from the side of puncture wound, possibly some  mild swelling. 2+ distal pulses. Cap refill is normal. No discharge from wound. Neurologic:  Normal speech and language. No gross focal neurologic deficits are appreciated. Skin:  Skin is warm, dry and intact. No rash noted. Psychiatric: Mood and affect  are normal. Patient exhibits appropriate insight and judgment.  ____________________________________________    LABS (pertinent positives/negatives)  Labs Reviewed - No data to display  ____________________________________________   EKG  None  ____________________________________________    RADIOLOGY I have personally reviewed any xrays that were ordered on this patient: No foreign body on foot x-ray  ____________________________________________   PROCEDURES  Procedure(s) performed: none  Critical Care performed: none  ____________________________________________   INITIAL IMPRESSION / ASSESSMENT AND PLAN / ED COURSE  Pertinent labs & imaging results that were available during my care of the patient were reviewed by me and considered in my medical decision making (see chart for details).  Patient with clinical exam consistent with cellulitis likely secondary to puncture wound. We will rx-ray the area to make sure there is not a residual foreign body. Vital signs unremarkable no evidence of sepsis or severe infection, I gauge that he is appropriate for outpatient treatment  He will require antibiotics and close follow-up with his PCP. He agrees to this plan.  Bactrim and Keflex prescribed  ____________________________________________   FINAL CLINICAL IMPRESSION(S) / ED DIAGNOSES  Final diagnoses:  Cellulitis of right lower extremity     Lavonia Drafts, MD 11/29/14 1349

## 2014-11-30 ENCOUNTER — Telehealth: Payer: Self-pay | Admitting: Family Medicine

## 2014-11-30 NOTE — Telephone Encounter (Signed)
Ok to put in. Thanks.

## 2014-11-30 NOTE — Telephone Encounter (Signed)
Pt stated he was at the ER yesterday 11/29/14 and has Cellulitis in his foot and was told he needed to f/u with Dr. Venia Minks on Tuesday 12/01/14. Can I put him in Dr. Sharyon Medicus 3:30 same day slot for tomorrow? Pt stated he needs to come in at late as possible. Please advise. Thanks TNP

## 2014-11-30 NOTE — Telephone Encounter (Signed)
LMTCB. Thanks TNP

## 2014-12-01 ENCOUNTER — Inpatient Hospital Stay: Payer: 59 | Admitting: Family Medicine

## 2014-12-02 ENCOUNTER — Encounter: Payer: Self-pay | Admitting: *Deleted

## 2014-12-02 ENCOUNTER — Emergency Department
Admission: EM | Admit: 2014-12-02 | Discharge: 2014-12-02 | Disposition: A | Discharge status: Home / Self Care | Payer: 59 | Attending: Emergency Medicine | Admitting: Emergency Medicine

## 2014-12-02 DIAGNOSIS — I10 Essential (primary) hypertension: Secondary | ICD-10-CM | POA: Insufficient documentation

## 2014-12-02 DIAGNOSIS — Z794 Long term (current) use of insulin: Secondary | ICD-10-CM | POA: Insufficient documentation

## 2014-12-02 DIAGNOSIS — L03115 Cellulitis of right lower limb: Secondary | ICD-10-CM

## 2014-12-02 DIAGNOSIS — L03031 Cellulitis of right toe: Secondary | ICD-10-CM | POA: Diagnosis not present

## 2014-12-02 DIAGNOSIS — Z7984 Long term (current) use of oral hypoglycemic drugs: Secondary | ICD-10-CM | POA: Insufficient documentation

## 2014-12-02 DIAGNOSIS — M79671 Pain in right foot: Secondary | ICD-10-CM | POA: Diagnosis present

## 2014-12-02 DIAGNOSIS — E1140 Type 2 diabetes mellitus with diabetic neuropathy, unspecified: Secondary | ICD-10-CM | POA: Insufficient documentation

## 2014-12-02 DIAGNOSIS — Z792 Long term (current) use of antibiotics: Secondary | ICD-10-CM | POA: Insufficient documentation

## 2014-12-02 DIAGNOSIS — Z79899 Other long term (current) drug therapy: Secondary | ICD-10-CM | POA: Diagnosis not present

## 2014-12-02 LAB — CBC WITH DIFFERENTIAL/PLATELET
Basophils Absolute: 0.1 10*3/uL (ref 0–0.1)
Basophils Relative: 1 %
EOS ABS: 0.1 10*3/uL (ref 0–0.7)
Eosinophils Relative: 1 %
HCT: 41.5 % (ref 40.0–52.0)
Hemoglobin: 14.4 g/dL (ref 13.0–18.0)
Lymphocytes Relative: 23 %
Lymphs Abs: 1.8 10*3/uL (ref 1.0–3.6)
MCH: 29.5 pg (ref 26.0–34.0)
MCHC: 34.7 g/dL (ref 32.0–36.0)
MCV: 85.2 fL (ref 80.0–100.0)
MONOS PCT: 15 %
Monocytes Absolute: 1.2 10*3/uL — ABNORMAL HIGH (ref 0.2–1.0)
Neutro Abs: 5 10*3/uL (ref 1.4–6.5)
Neutrophils Relative %: 60 %
PLATELETS: 223 10*3/uL (ref 150–440)
RBC: 4.87 MIL/uL (ref 4.40–5.90)
RDW: 12.7 % (ref 11.5–14.5)
WBC: 8.1 10*3/uL (ref 3.8–10.6)

## 2014-12-02 LAB — COMPREHENSIVE METABOLIC PANEL
ALT: 44 U/L (ref 17–63)
ANION GAP: 8 (ref 5–15)
AST: 37 U/L (ref 15–41)
Albumin: 3.7 g/dL (ref 3.5–5.0)
Alkaline Phosphatase: 58 U/L (ref 38–126)
BUN: 20 mg/dL (ref 6–20)
CHLORIDE: 97 mmol/L — AB (ref 101–111)
CO2: 26 mmol/L (ref 22–32)
Calcium: 9 mg/dL (ref 8.9–10.3)
Creatinine, Ser: 1.73 mg/dL — ABNORMAL HIGH (ref 0.61–1.24)
GFR calc Af Amer: 49 mL/min — ABNORMAL LOW (ref >60)
GFR calc non Af Amer: 43 mL/min — ABNORMAL LOW (ref >60)
Glucose, Bld: 424 mg/dL — ABNORMAL HIGH (ref 65–99)
POTASSIUM: 4.3 mmol/L (ref 3.5–5.1)
SODIUM: 131 mmol/L — AB (ref 135–145)
Total Bilirubin: 0.7 mg/dL (ref 0.3–1.2)
Total Protein: 7.5 g/dL (ref 6.5–8.1)

## 2014-12-02 MED ORDER — SODIUM CHLORIDE 0.9 % IV BOLUS (SEPSIS): 1000.0000 mL | Freq: Once | INTRAVENOUS | Status: DC

## 2014-12-02 NOTE — ED Notes (Signed)
Patient refused IV and saline bolus. States he will take his insulin when he gets home. States he ate a candy bar before coming here. NP made aware.

## 2014-12-02 NOTE — ED Provider Notes (Signed)
[facility] Emergency Department Provider Note ____________________________________________  Time seen: Approximately 7:08 PM  I have reviewed the triage vital signs and the nursing notes.   HISTORY  Chief Complaint Foot Pain   HPI Howard Young is a 54 y.o. male who presents to the emergency department for evaluation of drainage from the right foot. He is under treatment for cellulitis and taking Bactrim and Keflex. He noticed that there is now yellow drainage from the area where glass was removed. Overall, he feels the infection is improving--redness is decreasing from the markings and swelling is less as well as the pain is lessening.    Past Medical History  Diagnosis Date  . Morbid obesity ([place])   . Diabetes mellitus without complication ([place])   . GERD (gastroesophageal reflux disease)   . Hypertension     Patient Active Problem List   Diagnosis Date Noted  . Anxiety 09/15/2014  . Elevated CK 09/15/2014  . BP (high blood pressure) 09/15/2014  . Hypertriglyceridemia 09/15/2014  . Hypotestosteronism 09/15/2014  . NASH (nonalcoholic steatohepatitis) 09/15/2014  . Adiposity 09/15/2014  . PE (pulmonary embolism) 09/15/2014  . Diabetes mellitus with diabetic neuropathy ([place]) 09/15/2014  . Apnea, sleep 09/15/2014  . Right shoulder pain 08/31/2014  . Male hypogonadism 06/10/2013  . 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 Metabolic syndrome 16/11/9602  . Complication of gastric banding-insufficient weight loss 11/29/2011  . Obesity-BMI 41 03/31/2011  . Status post gastric banding-APL 2008 03/31/2011  . Pulmonary embolism-2005 03/31/2011  . WPW (Wolff-Parkinson-White syndrome)-cardiac ablation 1993 03/31/2011  . S/P cholecystectomy-2001 03/31/2011    Past Surgical History  Procedure Laterality Date  . Cholecystectomy  2001  . Joint replacement  2012    right hip  . Laparoscopic gastric banding  2008  . Heart ablation  1993  . Breath tek h  pylori  04/25/2011    Procedure: BREATH TEK H PYLORI;  Surgeon: Pedro Earls, MD;  Location: [facility];  Service: General;  Laterality: N/A;    Current Outpatient Rx  Name  Route  Sig  Dispense  Refill  . ALPRAZolam (XANAX) 1 MG tablet      Take 1/2 to 1 two times a day as needed.   20 tablet   0   . Blood Glucose Monitoring Suppl (ONE TOUCH ULTRA MINI) W/DEVICE KIT   Does not apply   1 Device by Does not apply route daily. To check blood sugar twice a day.   1 each   0   . cephALEXin (KEFLEX) 500 MG capsule   Oral   Take 1 capsule (500 mg total) by mouth 2 (two) times daily.   14 capsule   0   . cyanocobalamin 100 MCG tablet   Oral   Take 2 tablets by mouth 2 (two) times daily.         Marland Kitchen glucose blood (ONE TOUCH ULTRA TEST) test strip      To check blood sugar twice a day.   200 each   3   . insulin glargine (LANTUS) 100 UNIT/ML injection   Subcutaneous   Inject 60 Units into the skin at bedtime.         Marland Kitchen lisinopril-hydrochlorothiazide (PRINZIDE,ZESTORETIC) 20-12.5 MG tablet      TAKE ONE TABLET BY MOUTH AT BEDTIME   30 tablet   5   . metFORMIN (GLUCOPHAGE) 500 MG tablet   Oral   Take 500 mg by mouth 2 (two) times daily with a meal.         .  Multiple Vitamins-Minerals (MULTIVITAMIN PO)   Oral   Take by mouth daily.         Marland Kitchen NOVOLIN R RELION 100 UNIT/ML injection      INJECT 30 UNITS SQ A BEDTIME   10 vial   5   . ONE TOUCH LANCETS MISC      To check blood sugar twice a day.   200 each   3   . pantoprazole (PROTONIX) 40 MG tablet      Daily.         Marland Kitchen RELION INSULIN SYRINGE 29G X 1/2" 1 ML MISC      USE ONE SYRINGE ONCE DAILY   100 each   3     DX:  E11.9   . spironolactone (ALDACTONE) 50 MG tablet   Oral   Take 50 mg by mouth daily.         Marland Kitchen sulfamethoxazole-trimethoprim (BACTRIM DS,SEPTRA DS) 800-160 MG tablet   Oral   Take 1 tablet by mouth 2 (two) times daily.   14 tablet   0      Allergies Metoclopramide hcl  Family History  Problem Relation Age of Onset  . Cancer Mother     breast, lung  . Cancer Father     colon  . Cancer Sister     breast  . Cancer Sister     ovarian    Social History Social History  Substance Use Topics  . Smoking status: Never Smoker   . Smokeless tobacco: Never Used  . Alcohol Use: No    Review of Systems   Constitutional: No fever/chills Eyes: No visual changes. ENT: No congestion or rhinorrhea Cardiovascular: Denies chest pain. Respiratory: Denies shortness of breath. Gastrointestinal: No abdominal pain.  No nausea, no vomiting.  No diarrhea.  No constipation. Genitourinary: Negative for dysuria. Musculoskeletal: Negative for back pain. Skin: Yellow drainage from the right foot between the webbing of the 1st and 2nd toe. Neurological: Negative for headaches, focal weakness or numbness.  10-point ROS otherwise negative.  ____________________________________________   PHYSICAL EXAM:  VITAL SIGNS: ED Triage Vitals  Enc Vitals Group     BP 12/02/14 1823 160/74 mmHg     Pulse Rate 12/02/14 1823 81     Resp 12/02/14 1823 18     Temp 12/02/14 1823 97.9 F (36.6 C)     Temp Source 12/02/14 1823 Oral     SpO2 12/02/14 1823 97 %     Weight 12/02/14 1823 325 lb (147.419 kg)     Height 12/02/14 1823 6' 4"  (1.93 m)     Head Cir --      Peak Flow --      Pain Score 12/02/14 1822 3     Pain Loc --      Pain Edu? --      Excl. in [place]? --     Constitutional: Alert and oriented. Well appearing and in no acute distress. Eyes: Conjunctivae are normal. PERRL. EOMI. Head: Atraumatic. Nose: No congestion/rhinnorhea. Mouth/Throat: Mucous membranes are moist.  Oropharynx non-erythematous. No oral lesions. Neck: No stridor. Cardiovascular: Normal rate, regular rhythm.  Good peripheral circulation. Respiratory: Normal respiratory effort.  No retractions. Lungs CTAB. Gastrointestinal: Soft and nontender. No distention.  No abdominal bruits.  Musculoskeletal: No lower extremity tenderness nor edema.  No joint effusions. Neurologic:  Normal speech and language. No gross focal neurologic deficits are appreciated. Speech is normal. No gait instability. Skin:  Purulent drainage noted between 1st and 2nd toes  on the right foot. Erythema dissipating from the marked areas, no lymphangitis, mild swelling; Negative for petechiae.  Psychiatric: Mood and affect are normal. Speech and behavior are normal.  ____________________________________________   LABS (all labs ordered are listed, but only abnormal results are displayed)  Labs Reviewed  CBC WITH DIFFERENTIAL/PLATELET - Abnormal; Notable for the following:    Monocytes Absolute 1.2 (*)    All other components within normal limits  COMPREHENSIVE METABOLIC PANEL - Abnormal; Notable for the following:    Sodium 131 (*)    Chloride 97 (*)    Glucose, Bld 424 (*)    Creatinine, Ser 1.73 (*)    GFR calc non Af Amer 43 (*)    GFR calc Af Amer 49 (*)    All other components within normal limits  CBG MONITORING, ED  CBG MONITORING, ED  CBG MONITORING, ED  CBG MONITORING, ED  CBG MONITORING, ED  CBG MONITORING, ED  CBG MONITORING, ED  CBG MONITORING, ED  CBG MONITORING, ED  CBG MONITORING, ED  CBG MONITORING, ED  CBG MONITORING, ED  CBG MONITORING, ED  CBG MONITORING, ED   ____________________________________________  EKG   ____________________________________________  RADIOLOGY   ____________________________________________   PROCEDURES  Procedure(s) performed: None ____________________________________________   INITIAL IMPRESSION / ASSESSMENT AND PLAN / ED COURSE  Pertinent labs & imaging results that were available during my care of the patient were reviewed by me and considered in my medical decision making (see chart for details).  Patient refused IV fluids. He was advised that his glucose is over 400. He was advised that poor  control of his diabetes will make the infection resistant to treatment and could lead to amputation if he is not compliant. He was advised to follow up with his PCP in 2 days for a recheck. He was advised to return to the ER for symptoms that change or worsen.  ____________________________________________   FINAL CLINICAL IMPRESSION(S) / ED DIAGNOSES  Final diagnoses:  Cellulitis of foot, right       Victorino Dike, FNP 12/02/14 2159  Daymon Larsen, MD 12/02/14 2206

## 2014-12-02 NOTE — Discharge Instructions (Signed)
It is very important that you take your insulin as prescribed. Also, you should check your blood sugar on a regular basis. The infection in your foot is not going to go away easily if your blood sugar is elevated.  You need to see your primary care provider in 2 days for a recheck. If you notice red streaking on your foot or leg, you need to come back to the emergency department.  Continue taking the antibiotic as prescribed and until finished. Soak your foot in warm Epson Salts 4 times per day. Keep it elevated as much as possible through the day. Return to the ER immediately for symptoms that change or worsen if you are unable to schedule an appointment with primary care.  Cellulitis Cellulitis is an infection of the skin and the tissue beneath it. The infected area is usually red and tender. Cellulitis occurs most often in the arms and lower legs.  CAUSES  Cellulitis is caused by bacteria that enter the skin through cracks or cuts in the skin. The most common types of bacteria that cause cellulitis are staphylococci and streptococci. SIGNS AND SYMPTOMS   Redness and warmth.  Swelling.  Tenderness or pain.  Fever. DIAGNOSIS  Your health care provider can usually determine what is wrong based on a physical exam. Blood tests may also be done. TREATMENT  Treatment usually involves taking an antibiotic medicine. HOME CARE INSTRUCTIONS   Take your antibiotic medicine as directed by your health care provider. Finish the antibiotic even if you start to feel better.  Keep the infected arm or leg elevated to reduce swelling.  Apply a warm cloth to the affected area up to 4 times per day to relieve pain.  Take medicines only as directed by your health care provider.  Keep all follow-up visits as directed by your health care provider. SEEK MEDICAL CARE IF:   You notice red streaks coming from the infected area.  Your red area gets larger or turns dark in color.  Your bone or joint  underneath the infected area becomes painful after the skin has healed.  Your infection returns in the same area or another area.  You notice a swollen bump in the infected area.  You develop new symptoms.  You have a fever. SEEK IMMEDIATE MEDICAL CARE IF:   You feel very sleepy.  You develop vomiting or diarrhea.  You have a general ill feeling (malaise) with muscle aches and pains.   This information is not intended to replace advice given to you by your health care provider. Make sure you discuss any questions you have with your health care provider.   Document Released: 11/09/2004 Document Revised: 10/21/2014 Document Reviewed: 04/17/2011 Elsevier Interactive Patient Education [employer].

## 2014-12-02 NOTE — ED Notes (Signed)
Pt reports seen Sunday diagnosed with cellulitis to right foot. Swelling and pain minimized, but states increase in drainage at site. Continues to take two antibiotics.

## 2015-01-27 ENCOUNTER — Other Ambulatory Visit: Payer: Self-pay | Admitting: Family Medicine

## 2015-01-27 DIAGNOSIS — E1340 Other specified diabetes mellitus with diabetic neuropathy, unspecified: Secondary | ICD-10-CM

## 2015-03-22 ENCOUNTER — Encounter: Payer: Self-pay | Admitting: Family Medicine

## 2015-03-22 ENCOUNTER — Ambulatory Visit (INDEPENDENT_AMBULATORY_CARE_PROVIDER_SITE_OTHER): Payer: PRIVATE HEALTH INSURANCE | Admitting: Family Medicine

## 2015-03-22 VITALS — BP 138/86 | HR 64 | Temp 98.1°F | Resp 16 | Wt 342.0 lb

## 2015-03-22 DIAGNOSIS — E1340 Other specified diabetes mellitus with diabetic neuropathy, unspecified: Secondary | ICD-10-CM

## 2015-03-22 DIAGNOSIS — I10 Essential (primary) hypertension: Secondary | ICD-10-CM | POA: Diagnosis not present

## 2015-03-22 LAB — POCT GLYCOSYLATED HEMOGLOBIN (HGB A1C): HEMOGLOBIN A1C: 10

## 2015-03-22 MED ORDER — LIRAGLUTIDE 18 MG/3ML ~~LOC~~ SOPN: 0.6000 mL | PEN_INJECTOR | Freq: Every evening | SUBCUTANEOUS | Status: DC

## 2015-03-22 MED ORDER — LIRAGLUTIDE 18 MG/3ML ~~LOC~~ SOPN: 0.6000 mg | PEN_INJECTOR | Freq: Every evening | SUBCUTANEOUS | Status: DC

## 2015-03-22 MED ORDER — INSULIN GLARGINE 100 UNIT/ML ~~LOC~~ SOLN: 60.0000 [IU] | Freq: Every day | SUBCUTANEOUS | Status: DC

## 2015-03-22 NOTE — Progress Notes (Signed)
Patient ID: Howard Young, male   DOB: [birth year], 55 y.o.   MRN: [id number]         Patient: Howard Young Male    DOB: [birth year]   55 y.o.   MRN: [id number] Visit Date: 03/22/2015  Today's Provider: Margarita Rana, MD   Chief Complaint  Patient presents with  . Hypertension  . Diabetes   Subjective:    Hypertension This is a chronic problem. The problem is controlled. Associated symptoms include palpitations. Pertinent negatives include no anxiety, blurred vision, chest pain, headaches, malaise/fatigue, neck pain, orthopnea, peripheral edema, PND, shortness of breath or sweats. Risk factors for coronary artery disease include diabetes mellitus and obesity.  Diabetes He presents for his follow-up diabetic visit. He has type 2 diabetes mellitus. His disease course has been worsening. Pertinent negatives for hypoglycemia include no dizziness, headaches or sweats. Associated symptoms include foot paresthesias and polydipsia. Pertinent negatives for diabetes include no blurred vision, no chest pain, no fatigue, no foot ulcerations, no polyphagia, no polyuria, no visual change, no weakness and no weight loss. Symptoms are worsening. His home blood glucose trend is increasing steadily.   Lab Results  Component Value Date   CHOL 196 06/12/2014   HDL 41 06/12/2014   LDLCALC 124* 06/12/2014   TRIG 157* 06/12/2014   BP Readings from Last 3 Encounters:  03/22/15 138/86  12/02/14 160/76  11/29/14 159/90       Allergies  Allergen Reactions  . Metoclopramide Hcl Shortness Of Breath and Anxiety   Previous Medications   ALPRAZOLAM (XANAX) 1 MG TABLET    Take 1/2 to 1 two times a day as needed.   BLOOD GLUCOSE MONITORING SUPPL (ONE TOUCH ULTRA MINI) W/DEVICE KIT    1 Device by Does not apply route daily. To check blood sugar twice a day.   CYANOCOBALAMIN 100 MCG TABLET    Take 2 tablets by mouth 2 (two) times daily.   GLUCOSE BLOOD (ONE TOUCH ULTRA TEST) TEST STRIP    To check  blood sugar twice a day.   INSULIN GLARGINE (LANTUS) 100 UNIT/ML INJECTION    Inject 60 Units into the skin at bedtime.   LISINOPRIL-HYDROCHLOROTHIAZIDE (PRINZIDE,ZESTORETIC) 20-12.5 MG TABLET    TAKE ONE TABLET BY MOUTH AT BEDTIME   METFORMIN (GLUCOPHAGE) 500 MG TABLET    Take 500 mg by mouth 2 (two) times daily with a meal.   MULTIPLE VITAMINS-MINERALS (MULTIVITAMIN PO)    Take by mouth daily.   NOVOLIN R RELION 100 UNIT/ML INJECTION    INJECT 30 UNITS SUBCUTANEOUSLY AT BEDTIME   ONE TOUCH LANCETS MISC    To check blood sugar twice a day.   PANTOPRAZOLE (PROTONIX) 40 MG TABLET    Daily.   RELION INSULIN SYRINGE 29G X 1/2" 1 ML MISC    USE ONE SYRINGE ONCE DAILY   SPIRONOLACTONE (ALDACTONE) 50 MG TABLET    Take 50 mg by mouth daily.    Review of Systems  Constitutional: Negative for fever, chills, weight loss, malaise/fatigue, diaphoresis, activity change, appetite change, fatigue and unexpected weight change.  Eyes: Negative for blurred vision.  Respiratory: Negative.  Negative for shortness of breath.   Cardiovascular: Positive for palpitations and leg swelling. Negative for chest pain, orthopnea and PND.  Gastrointestinal: Negative.   Endocrine: Positive for polydipsia. Negative for cold intolerance, heat intolerance, polyphagia and polyuria.  Musculoskeletal: Positive for myalgias (Has leg cramps occasionally especially during the day.). Negative for back pain, joint swelling, arthralgias, gait problem, neck  pain and neck stiffness.  Neurological: Negative for dizziness, weakness, light-headedness, numbness and headaches.    Social History  Substance Use Topics  . Smoking status: Never Smoker   . Smokeless tobacco: Never Used  . Alcohol Use: No   Objective:   BP 138/86 mmHg  Pulse 64  Temp(Src) 98.1 F (36.7 C) (Oral)  Resp 16  Wt 342 lb (155.13 kg)  Results for orders placed or performed in visit on 03/22/15  POCT glycosylated hemoglobin (Hb A1C)  Result Value Ref Range     Hemoglobin A1C 10.0     Physical Exam  Constitutional: He is oriented to person, place, and time. He appears well-developed and well-nourished.  Cardiovascular: Normal rate, regular rhythm and normal heart sounds.   Pulmonary/Chest: Effort normal and breath sounds normal.  Neurological: He is alert and oriented to person, place, and time.  Psychiatric: He has a normal mood and affect. His behavior is normal. Judgment and thought content normal.      Assessment & Plan:     1. Essential hypertension Stable. Continue current medication and plan of care.     2. Other specified diabetes mellitus with diabetic neuropathy ([place]) Not controlled. Will restart Victoza which worked really well for him previously, work on diet adjustment and increase Lantus if needed. Currently on 45 units every evening.   - POCT glycosylated hemoglobin (Hb A1C) - insulin glargine (LANTUS) 100 UNIT/ML injection; Inject 0.6 mLs (60 Units total) into the skin at bedtime.  Dispense: 10 mL; Refill: 5 - Liraglutide (VICTOZA) 18 MG/3ML SOPN; Inject 0.6 mLs (3.6 mg total) into the skin every evening.  Dispense: 6 mL; Refill: 5  Patient was seen and examined by Jerrell Belfast, MD, and note scribed by Ashley Royalty, [place].  I have reviewed the document for accuracy and completeness and I agree with above. - Jerrell Belfast, MD      Margarita Rana, MD  [facility]

## 2015-03-24 ENCOUNTER — Other Ambulatory Visit: Payer: Self-pay | Admitting: Family Medicine

## 2015-03-24 MED ORDER — INSULIN GLARGINE 100 UNIT/ML SOLOSTAR PEN
60.0000 [IU] | PEN_INJECTOR | Freq: Every day | SUBCUTANEOUS | Status: DC
Start: 2015-03-24 — End: 2015-03-30

## 2015-03-24 NOTE — Telephone Encounter (Signed)
Pt stated that his insurance no longer cover the Lantus without PA but will cover Levemir, Tyler Aas, or Photographer. Pt would like one of those sent into [facility] and would like 90 days supplies of his other medications as well b/c his insurance will cover 100 % if they are sent in for 90 day supply. Please advise. Thanks TNP

## 2015-03-24 NOTE — Telephone Encounter (Signed)
Sent new insulin. Please send other rx for 90 days. Also, call patient and notify him that send in new insulin. Should be same as dosing as Lantus, Wrote for 60 units, but would stick with current does of 45 until we know if addition of new medication will make any difference. Thanks.

## 2015-03-24 NOTE — Telephone Encounter (Signed)
Informed pt as below. Pt verbalized understanding. Renaldo Fiddler, CMA

## 2015-03-30 ENCOUNTER — Other Ambulatory Visit: Payer: Self-pay

## 2015-03-30 DIAGNOSIS — E1340 Other specified diabetes mellitus with diabetic neuropathy, unspecified: Secondary | ICD-10-CM

## 2015-03-30 MED ORDER — INSULIN GLARGINE 100 UNIT/ML SOLOSTAR PEN: 60.0000 [IU] | PEN_INJECTOR | Freq: Every day | SUBCUTANEOUS | Status: DC

## 2015-04-28 ENCOUNTER — Other Ambulatory Visit: Payer: Self-pay

## 2015-04-29 ENCOUNTER — Other Ambulatory Visit: Payer: Self-pay

## 2015-04-29 DIAGNOSIS — E1340 Other specified diabetes mellitus with diabetic neuropathy, unspecified: Secondary | ICD-10-CM

## 2015-04-29 MED ORDER — METFORMIN HCL 500 MG PO TABS: 500.0000 mg | ORAL_TABLET | Freq: Two times a day (BID) | ORAL | Status: DC

## 2015-04-29 MED ORDER — PANTOPRAZOLE SODIUM 40 MG PO TBEC: 40.0000 mg | DELAYED_RELEASE_TABLET | Freq: Every day | ORAL | Status: DC

## 2015-05-04 ENCOUNTER — Other Ambulatory Visit: Payer: Self-pay

## 2015-05-04 DIAGNOSIS — I10 Essential (primary) hypertension: Secondary | ICD-10-CM

## 2015-05-04 MED ORDER — LISINOPRIL-HYDROCHLOROTHIAZIDE 20-12.5 MG PO TABS: 1.0000 | ORAL_TABLET | Freq: Every day | ORAL | Status: DC

## 2015-05-12 ENCOUNTER — Other Ambulatory Visit: Payer: Self-pay

## 2015-05-12 DIAGNOSIS — E1340 Other specified diabetes mellitus with diabetic neuropathy, unspecified: Secondary | ICD-10-CM

## 2015-05-12 MED ORDER — LIRAGLUTIDE 18 MG/3ML ~~LOC~~ SOPN: 0.6000 mg | PEN_INJECTOR | Freq: Every evening | SUBCUTANEOUS | Status: DC

## 2015-05-12 MED ORDER — INSULIN PEN NEEDLE 32G X 4 MM MISC: Status: DC

## 2015-05-12 MED ORDER — ONETOUCH LANCETS MISC: Status: DC

## 2015-05-24 ENCOUNTER — Other Ambulatory Visit: Payer: Self-pay

## 2015-05-24 DIAGNOSIS — E1340 Other specified diabetes mellitus with diabetic neuropathy, unspecified: Secondary | ICD-10-CM

## 2015-05-24 DIAGNOSIS — E0940 Drug or chemical induced diabetes mellitus with neurological complications with diabetic neuropathy, unspecified: Secondary | ICD-10-CM

## 2015-05-24 MED ORDER — INSULIN GLARGINE 100 UNIT/ML SOLOSTAR PEN: 60.0000 [IU] | PEN_INJECTOR | Freq: Every day | SUBCUTANEOUS | Status: DC

## 2015-06-07 ENCOUNTER — Encounter: Payer: Self-pay | Admitting: Family Medicine

## 2015-06-07 ENCOUNTER — Ambulatory Visit (INDEPENDENT_AMBULATORY_CARE_PROVIDER_SITE_OTHER): Payer: PRIVATE HEALTH INSURANCE | Admitting: Family Medicine

## 2015-06-07 VITALS — BP 150/98 | HR 80 | Temp 98.2°F | Resp 20 | Ht 76.0 in | Wt 340.0 lb

## 2015-06-07 DIAGNOSIS — Z0000 Encounter for general adult medical examination without abnormal findings: Secondary | ICD-10-CM

## 2015-06-07 DIAGNOSIS — Z1211 Encounter for screening for malignant neoplasm of colon: Secondary | ICD-10-CM

## 2015-06-07 DIAGNOSIS — E0840 Diabetes mellitus due to underlying condition with diabetic neuropathy, unspecified: Secondary | ICD-10-CM | POA: Diagnosis not present

## 2015-06-07 DIAGNOSIS — I10 Essential (primary) hypertension: Secondary | ICD-10-CM | POA: Diagnosis not present

## 2015-06-07 DIAGNOSIS — G4730 Sleep apnea, unspecified: Secondary | ICD-10-CM | POA: Diagnosis not present

## 2015-06-07 DIAGNOSIS — R002 Palpitations: Secondary | ICD-10-CM

## 2015-06-07 DIAGNOSIS — E781 Pure hyperglyceridemia: Secondary | ICD-10-CM

## 2015-06-07 NOTE — Progress Notes (Signed)
Patient ID: JOON POHLE, male   DOB: 11/28/1960, 55 y.o.   MRN: [id number]       Patient: Howard Young, Male    DOB: 1960/02/16, 55 y.o.   MRN: [id number] Visit Date: 06/07/2015  Today's Provider: Margarita Rana, MD   Chief Complaint  Patient presents with  . Annual Exam  . Palpitations  . Sleep Apnea   Subjective:    Annual physical exam ZYRION COEY is a 55 y.o. male who presents today for health maintenance and complete physical. He feels well. He reports exercising 5 days a week. He reports he is sleeping well.  06/01/14 CPE 05/23/10 Colonoscopy-polyp, diverticulosis, reckeck in 5 years ----------------------------------------------------------------- Sleep Apnea: Patient presents with possible obstructive sleep apnea. Patent has a several years history of symptoms of daytime fatigue and morning fatigue. Patient generally gets 6 or 7 hours of sleep per night, and states they generally have generally restful sleep. Snoring of moderate severity is present. Apneic episodes is present. Nasal obstruction is present.  Patient has not had tonsillectomy.  Palpitations: Patient complains of palpitations.  The symptoms are of mild severity, occuring intermittently and lasting 5 minutes per episode. Cardiac risk factors include: advanced age (older than 89 for men, 51 for women), diabetes mellitus, hypertension and obesity (BMI >= 30 kg/m2). Aggravating factors: none. Relieving factors: none. Associated signs and symptoms: none   Review of Systems  Constitutional: Negative.   HENT: Negative.   Eyes: Negative.   Respiratory: Negative.   Cardiovascular: Positive for palpitations.  Gastrointestinal: Negative.   Endocrine: Negative.   Genitourinary: Negative.   Musculoskeletal: Negative.   Skin: Positive for wound.  Allergic/Immunologic: Negative.   Neurological: Negative.   Hematological: Negative.   Psychiatric/Behavioral: Negative.     Social History      He   reports that he has never smoked. He has never used smokeless tobacco. He reports that he does not drink alcohol or use illicit drugs.       Social History   Social History  . Marital Status: Married    Spouse Name: N/A  . Number of Children: N/A  . Years of Education: N/A   Social History Main Topics  . Smoking status: Never Smoker   . Smokeless tobacco: Never Used  . Alcohol Use: No  . Drug Use: No  . Sexual Activity: Not Asked   Other Topics Concern  . None   Social History Narrative    Past Medical History  Diagnosis Date  . Morbid obesity ([place])   . Diabetes mellitus without complication ([place])   . GERD (gastroesophageal reflux disease)   . Hypertension      Patient Active Problem List   Diagnosis Date Noted  . Anxiety 09/15/2014  . Elevated CK 09/15/2014  . BP (high blood pressure) 09/15/2014  . Hypertriglyceridemia 09/15/2014  . Hypotestosteronism 09/15/2014  . NASH (nonalcoholic steatohepatitis) 09/15/2014  . Adiposity 09/15/2014  . PE (pulmonary embolism) 09/15/2014  . Diabetes mellitus with diabetic neuropathy ([place]) 09/15/2014  . Apnea, sleep 09/15/2014  . Right shoulder pain 08/31/2014  . Male hypogonadism 06/10/2013  . 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 Metabolic syndrome 34/74/2595  . Complication of gastric banding-insufficient weight loss 11/29/2011  . Obesity-BMI 41 03/31/2011  . Status post gastric banding-APL 2008 03/31/2011  . Pulmonary embolism-2005 03/31/2011  . WPW (Wolff-Parkinson-White syndrome)-cardiac ablation 1993 03/31/2011  . S/P cholecystectomy-2001 03/31/2011    Past Surgical History  Procedure Laterality Date  . Cholecystectomy  2001  . Joint replacement  2012    right hip  . Laparoscopic gastric banding  2008  . Heart ablation  1993  . Breath tek h pylori  04/25/2011    Procedure: BREATH TEK H PYLORI;  Surgeon: Pedro Earls, MD;  Location: [facility];  Service: General;  Laterality: N/A;    Family History         Family Status  Relation Status Death Age  . Mother Deceased 75  . Father Deceased 55    suicide  . Sister Alive   . Sister Alive   . Sister Alive         His family history includes Cancer in his father, mother, sister, and sister.    Allergies  Allergen Reactions  . Metoclopramide Hcl Shortness Of Breath and Anxiety    Previous Medications   ALPRAZOLAM (XANAX) 1 MG TABLET    Take 1/2 to 1 two times a day as needed.   BLOOD GLUCOSE MONITORING SUPPL (ONE TOUCH ULTRA MINI) W/DEVICE KIT    1 Device by Does not apply route daily. To check blood sugar twice a day.   CYANOCOBALAMIN 100 MCG TABLET    Take 2 tablets by mouth 2 (two) times daily.   GLUCOSE BLOOD (ONE TOUCH ULTRA TEST) TEST STRIP    To check blood sugar twice a day.   INSULIN GLARGINE (BASAGLAR KWIKPEN) 100 UNIT/ML SOPN    INJECT 60 UNITS INTO THE SKIN DAILY AT 10 PM.   INSULIN PEN NEEDLE 32G X 4 MM MISC    To use with Victoza every evening   LIRAGLUTIDE (VICTOZA) 18 MG/3ML SOPN    Inject 0.1 mLs (0.6 mg total) into the skin every evening.   LISINOPRIL-HYDROCHLOROTHIAZIDE (PRINZIDE,ZESTORETIC) 20-12.5 MG TABLET    Take 1 tablet by mouth at bedtime.   METFORMIN (GLUCOPHAGE) 500 MG TABLET    Take 1 tablet (500 mg total) by mouth 2 (two) times daily with a meal.   MULTIPLE VITAMINS-MINERALS (MULTIVITAMIN PO)    Take by mouth daily.   ONE TOUCH LANCETS MISC    To check blood sugar twice a day.   PANTOPRAZOLE (PROTONIX) 40 MG TABLET    Take 1 tablet (40 mg total) by mouth daily.   RELION INSULIN SYRINGE 29G X 1/2" 1 ML MISC    USE ONE SYRINGE ONCE DAILY   SPIRONOLACTONE (ALDACTONE) 50 MG TABLET    Take 50 mg by mouth daily.    Patient Care Team: Margarita Rana, MD as PCP - General (Family Medicine)     Objective:   Vitals: BP 150/98 mmHg  Pulse 80  Temp(Src) 98.2 F (36.8 C) (Oral)  Resp 20  Ht _0  (1.93 m)  Wt 340 lb (154.223 kg)  BMI 41.40 kg/m2  SpO2 97%   Physical Exam  Constitutional: He is oriented to  person, place, and time. He appears well-developed and well-nourished.  HENT:  Head: Normocephalic and atraumatic.  Right Ear: External ear normal.  Left Ear: External ear normal.  Mouth/Throat: Oropharynx is clear and moist.  Eyes: Conjunctivae are normal. Pupils are equal, round, and reactive to light.  Neck: Normal range of motion. Neck supple.  Cardiovascular: Normal rate, regular rhythm and normal heart sounds.   Pulmonary/Chest: Effort normal and breath sounds normal.  Abdominal: Soft. Bowel sounds are normal.  Musculoskeletal: Normal range of motion.  Neurological: He is alert and oriented to person, place, and time.  Skin: Skin is warm and dry.  Psychiatric: He has a normal mood and affect.  His behavior is normal. Judgment and thought content normal.     Depression Screen PHQ 2/9 Scores 06/07/2015  PHQ - 2 Score 0      Assessment & Plan:     Routine Health Maintenance and Physical Exam  Exercise Activities and Dietary recommendations Goals    None      Immunization History  Administered Date(s) Administered  . Influenza-Unspecified 11/14/2014   1. Annual physical exam Continue to work on diet and exercise for weight loss and diabetes control.   2. Essential hypertension Condition is stable. Please continue current medication and  plan of care as noted.   - CBC with Differential/Platelet - Comprehensive metabolic panel  3. Hypertriglyceridemia Check labs.  - Lipid Panel With LDL/HDL Ratio - TSH  4. Apnea, sleep Does have sleep apnea. ESS 4.  Does have symptoms if does not have machine.   Needs new supplies.   - Home sleep test; Future  5. Palpitations New problem. Has not been worked up. Will refer to evaluate further.   - EKG 12-Lead - Ambulatory referral to Cardiology  6. Diabetes mellitus due to underlying condition with diabetic neuropathy, unspecified long term insulin use status (HCC) Has scaly lesion with sore between toes.   Will refer to  podiatry to evaluate and treat.    - Ambulatory referral to Podiatry   7. Colon cancer screening Will refer.   - Ambulatory referral to Gastroenterology   Patient seen and examined by Dr. Jerrell Belfast, and note scribed by Philbert Riser. [place], CMA.  I have reviewed the document for accuracy and completeness and I agree with above. Jerrell Belfast, MD   Margarita Rana, MD   --------------------------------------------------------------------

## 2015-06-08 ENCOUNTER — Other Ambulatory Visit
Admission: RE | Admit: 2015-06-08 | Discharge: 2015-06-08 | Disposition: A | Discharge status: Home / Self Care | Payer: PRIVATE HEALTH INSURANCE | Source: Ambulatory Visit | Attending: Family Medicine | Admitting: Family Medicine

## 2015-06-08 DIAGNOSIS — E781 Pure hyperglyceridemia: Secondary | ICD-10-CM | POA: Insufficient documentation

## 2015-06-08 DIAGNOSIS — I10 Essential (primary) hypertension: Secondary | ICD-10-CM | POA: Insufficient documentation

## 2015-06-08 LAB — LIPID PANEL
CHOLESTEROL: 187 mg/dL (ref 0–200)
HDL: 41 mg/dL (ref >40)
LDL Cholesterol: 119 mg/dL — ABNORMAL HIGH (ref 0–99)
Total CHOL/HDL Ratio: 4.6 RATIO
Triglycerides: 137 mg/dL (ref <150)
VLDL: 27 mg/dL (ref 0–40)

## 2015-06-08 LAB — CBC WITH DIFFERENTIAL/PLATELET
BASOS ABS: 0.1 10*3/uL (ref 0–0.1)
Basophils Relative: 1 %
Eosinophils Absolute: 0.1 10*3/uL (ref 0–0.7)
Eosinophils Relative: 2 %
HEMATOCRIT: 46.4 % (ref 40.0–52.0)
HEMOGLOBIN: 16 g/dL (ref 13.0–18.0)
LYMPHS ABS: 1.5 10*3/uL (ref 1.0–3.6)
LYMPHS PCT: 21 %
MCH: 28.3 pg (ref 26.0–34.0)
MCHC: 34.5 g/dL (ref 32.0–36.0)
MCV: 82 fL (ref 80.0–100.0)
Monocytes Absolute: 0.8 10*3/uL (ref 0.2–1.0)
Monocytes Relative: 11 %
NEUTROS ABS: 4.9 10*3/uL (ref 1.4–6.5)
Neutrophils Relative %: 65 %
Platelets: 194 10*3/uL (ref 150–440)
RBC: 5.66 MIL/uL (ref 4.40–5.90)
RDW: 13.4 % (ref 11.5–14.5)
WBC: 7.5 10*3/uL (ref 3.8–10.6)

## 2015-06-08 LAB — COMPREHENSIVE METABOLIC PANEL
ALK PHOS: 40 U/L (ref 38–126)
ALT: 56 U/L (ref 17–63)
AST: 55 U/L — AB (ref 15–41)
Albumin: 4 g/dL (ref 3.5–5.0)
Anion gap: 8 (ref 5–15)
BILIRUBIN TOTAL: 2.2 mg/dL — AB (ref 0.3–1.2)
BUN: 21 mg/dL — AB (ref 6–20)
CALCIUM: 9.3 mg/dL (ref 8.9–10.3)
CHLORIDE: 101 mmol/L (ref 101–111)
CO2: 26 mmol/L (ref 22–32)
CREATININE: 1.24 mg/dL (ref 0.61–1.24)
GFR calc Af Amer: >60 mL/min (ref >60)
Glucose, Bld: 175 mg/dL — ABNORMAL HIGH (ref 65–99)
Potassium: 4.2 mmol/L (ref 3.5–5.1)
Sodium: 135 mmol/L (ref 135–145)
Total Protein: 7.5 g/dL (ref 6.5–8.1)

## 2015-06-08 LAB — TSH: TSH: 1.79 u[IU]/mL (ref 0.350–4.500)

## 2015-06-09 ENCOUNTER — Telehealth: Payer: Self-pay

## 2015-06-09 MED ORDER — ATORVASTATIN CALCIUM 10 MG PO TABS: 10.0000 mg | ORAL_TABLET | Freq: Every day | ORAL | Status: DC

## 2015-06-09 NOTE — Telephone Encounter (Signed)
LMTCB 06/09/2015  Thanks,   -Starletta Houchin  

## 2015-06-09 NOTE — Telephone Encounter (Signed)
Pt returned call. Thanks TNP °

## 2015-06-09 NOTE — Telephone Encounter (Signed)
Pt advised, patient agreed to starting medication, please send this in and patient will check with pharmacy later -aa

## 2015-06-09 NOTE — Telephone Encounter (Signed)
-----   Message from Margarita Rana, MD sent at 06/08/2015  7:49 PM EDT ----- Labs stable. Sugar better than previous. Cholesterol stable, but newer guidelines would recommend treatment with statin secondary to diabetes. Please see if would like to start a statin medication. 10 year risk of heart disease is 19 percent. Also please make sure he is taking a baby aspirin every day.  Thanks.

## 2015-06-09 NOTE — Telephone Encounter (Signed)
Sent in rx.  Recheck labs in 4 weeks.  Stop if any muscle pain.  Thanks.

## 2015-06-10 NOTE — Telephone Encounter (Signed)
Informed pt. Howard Young, CMA  

## 2015-06-29 ENCOUNTER — Encounter (INDEPENDENT_AMBULATORY_CARE_PROVIDER_SITE_OTHER): Payer: Self-pay

## 2015-06-29 ENCOUNTER — Encounter: Payer: Self-pay | Admitting: Cardiology

## 2015-06-29 ENCOUNTER — Ambulatory Visit (INDEPENDENT_AMBULATORY_CARE_PROVIDER_SITE_OTHER): Payer: PRIVATE HEALTH INSURANCE | Admitting: Cardiology

## 2015-06-29 VITALS — BP 160/110 | HR 86 | Ht 76.0 in | Wt 337.1 lb

## 2015-06-29 DIAGNOSIS — I10 Essential (primary) hypertension: Secondary | ICD-10-CM | POA: Diagnosis not present

## 2015-06-29 DIAGNOSIS — R9431 Abnormal electrocardiogram [ECG] [EKG]: Secondary | ICD-10-CM

## 2015-06-29 DIAGNOSIS — Z7189 Other specified counseling: Secondary | ICD-10-CM | POA: Diagnosis not present

## 2015-06-29 DIAGNOSIS — E785 Hyperlipidemia, unspecified: Secondary | ICD-10-CM

## 2015-06-29 DIAGNOSIS — R002 Palpitations: Secondary | ICD-10-CM

## 2015-06-29 NOTE — Progress Notes (Signed)
Cardiology Office Note   Date:  06/29/2015   ID:  Howard Young, DOB November 08, 1960, MRN [id number]  Referring Doctor:  Margarita Rana, MD   Cardiologist:   Wende Bushy, MD   Reason for consultation:  Chief Complaint  Patient presents with  . Establish Care    palpitations      History of Present Illness: Howard Young is a 55 y.o. male who presents for Palpitations. Patient has noted this for years, mostly noted at bedtime. Mild intensity, symptoms mainly in the chest. No associated chest pain and shortness of breath. He has never had this worked up before and wanted to do this now that he is getting older.  Patient reports that he exercises and does power lifts. He does not note any chest pain or shortness of breath with these activities.  He does have risk factors for CAD including diabetes, hypertension, hyperlipidemia.  Patient denies fever, cough, colds, abdominal pain. He does have sleep apnea and uses CPAP. Denies PND, orthopnea, edema.   ROS:  Please see the history of present illness. Aside from mentioned under HPI, all other systems are reviewed and negative.     Past Medical History  Diagnosis Date  . Morbid obesity ([place])   . Diabetes mellitus without complication ([place])   . GERD (gastroesophageal reflux disease)   . Hypertension     Past Surgical History  Procedure Laterality Date  . Cholecystectomy  2001  . Joint replacement  2012    right hip  . Laparoscopic gastric banding  2008  . Heart ablation  1993  . Breath tek h pylori  04/25/2011    Procedure: BREATH TEK H PYLORI;  Surgeon: Pedro Earls, MD;  Location: [facility];  Service: General;  Laterality: N/A;     reports that he has never smoked. He has never used smokeless tobacco. He reports that he does not drink alcohol or use illicit drugs.   family history includes Cancer in his father, mother, sister, and sister.   Current Outpatient Prescriptions  Medication Sig Dispense Refill    . ALPRAZolam (XANAX) 1 MG tablet Take 1/2 to 1 two times a day as needed. 20 tablet 0  . atorvastatin (LIPITOR) 10 MG tablet Take 1 tablet (10 mg total) by mouth daily. 90 tablet 3  . cyanocobalamin 100 MCG tablet Take 2 tablets by mouth 2 (two) times daily.    . Insulin Glargine (BASAGLAR KWIKPEN) 100 UNIT/ML SOPN INJECT 60 UNITS INTO THE SKIN DAILY AT 10 PM.  3  . Liraglutide (VICTOZA) 18 MG/3ML SOPN Inject 0.1 mLs (0.6 mg total) into the skin every evening. 9 mL 5  . lisinopril-hydrochlorothiazide (PRINZIDE,ZESTORETIC) 20-12.5 MG tablet Take 1 tablet by mouth at bedtime. 90 tablet 1  . metFORMIN (GLUCOPHAGE) 500 MG tablet Take 1 tablet (500 mg total) by mouth 2 (two) times daily with a meal. 180 tablet 1  . Multiple Vitamins-Minerals (MULTIVITAMIN PO) Take by mouth daily.    . pantoprazole (PROTONIX) 40 MG tablet Take 1 tablet (40 mg total) by mouth daily. 90 tablet 1  . spironolactone (ALDACTONE) 50 MG tablet Take 50 mg by mouth daily.    . [DISCONTINUED] insulin aspart (NOVOLOG) 100 UNIT/ML injection Inject 60 Units into the skin at bedtime. Patient taking 30 - 60 units daily      No current facility-administered medications for this visit.    Allergies: Metoclopramide hcl    PHYSICAL EXAM: VS:  BP 160/110 mmHg  Pulse 86  Ht 6\' 4"  (1.93 m)  Wt 337 lb 1.9 oz (152.917 kg)  BMI 41.05 kg/m2  SpO2 93% , Body mass index is 41.05 kg/(m^2). Wt Readings from Last 3 Encounters:  06/29/15 337 lb 1.9 oz (152.917 kg)  06/07/15 340 lb (154.223 kg)  03/22/15 342 lb (155.13 kg)    GENERAL:  well developed, well nourished, Morbidly obese, not in acute distress HEENT: normocephalic, pink conjunctivae, anicteric sclerae, no xanthelasma, normal dentition, oropharynx clear NECK:  no neck vein engorgement, JVP normal, no hepatojugular reflux, carotid upstroke brisk and symmetric, no bruit, no thyromegaly, no lymphadenopathy LUNGS:  good respiratory effort, clear to auscultation bilaterally CV:   PMI not displaced, no thrills, no lifts, S1 and S2 within normal limits, no palpable S3 or S4, no murmurs, no rubs, no gallops ABD:  Soft, nontender, nondistended, normoactive bowel sounds, no abdominal aortic bruit, no hepatomegaly, no splenomegaly MS: nontender back, no kyphosis, no scoliosis, no joint deformities EXT:  2+ DP/PT pulses, no edema, no varicosities, no cyanosis, no clubbing SKIN: warm, nondiaphoretic, normal turgor, no ulcers NEUROPSYCH: alert, oriented to person, place, and time, sensory/motor grossly intact, normal mood, appropriate affect  Recent Labs: 06/08/2015: ALT 56; BUN 21*; Creatinine, Ser 1.24; Hemoglobin 16.0; Platelets 194; Potassium 4.2; Sodium 135; TSH 1.790   Lipid Panel    Component Value Date/Time   CHOL 187 06/08/2015 1358   CHOL 196 06/12/2014 1204   TRIG 137 06/08/2015 1358   TRIG 157* 06/12/2014 1204   HDL 41 06/08/2015 1358   HDL 41 06/12/2014 1204   CHOLHDL 4.6 06/08/2015 1358   VLDL 27 06/08/2015 1358   VLDL 31 06/12/2014 1204   LDLCALC 119* 06/08/2015 1358   LDLCALC 124* 06/12/2014 1204     Other studies Reviewed:  EKG:  The ekg from 06/29/2015 was personally reviewed by me and it revealed sinus rhythm, 83 BPM. PVCs. Q waves in V1 and V2.  Additional studies/ records that were reviewed personally reviewed by me today include: None available   ASSESSMENT AND PLAN:  Palpitations Note of PVCs on EKG Labs from April 2017 were within normal limits. Recommend echocardiogram, 24-hour Holter monitor to determine PVC burden. Recommend evaluation for ischemia with stress testing. Also to note PVCs if they occur with exertion.  Abnormal EKG with Q waves in V1 and V2 May be related to obesity Echocardiogram will be ordered. Patient has risk factors for CAD including diabetes and hypertension, hyperlipidemia, morbid obesity. Recommend exercise nuclear stress testing.  Hypertension Recommend blood pressure log. Continue medications for  now.  Hyperlipidemia  LDL being followed by PCP. Recommended goal is less than 70 due to diabetes.  Obesity Body mass index is 41.05 kg/(m^2).Marland Kitchen Recommend aggressive weight loss through diet and increased physical activity. Once cardiac workup is done.   Current medicines are reviewed at length with the patient today.  The patient does not have concerns regarding medicines.  Labs/ tests ordered today include:  Orders Placed This Encounter  Procedures  . NM Myocar Multi W/Spect W/Wall Motion / EF  . Holter monitor - 24 hour  . EKG 12-Lead  . ECHOCARDIOGRAM COMPLETE    I had a lengthy and detailed discussion with the patient regarding diagnoses, prognosis, diagnostic options, treatment options .   I counseled the patient on importance of lifestyle modification including heart healthy diet, regular physical activity    Disposition:   FU with undersigned after tests   Signed, Wende Bushy, MD  06/29/2015 1:59 PM    [facility]

## 2015-06-29 NOTE — Patient Instructions (Addendum)
Medication Instructions:  Your physician recommends that you continue on your current medications as directed. Please refer to the Current Medication list given to you today.   Labwork: None ordered  Testing/Procedures: Your physician has requested that you have an echocardiogram. Echocardiography is a painless test that uses sound waves to create images of your heart. It provides your doctor with information about the size and shape of your heart and how well your heart's chambers and valves are working. This procedure takes approximately one hour. There are no restrictions for this procedure.  Date & Time: ______________________________________________________________________  Your physician has recommended that you wear a holter monitor. Holter monitors are medical devices that record the heart's electrical activity. Doctors most often use these monitors to diagnose arrhythmias. Arrhythmias are problems with the speed or rhythm of the heartbeat. The monitor is a small, portable device. You can wear one while you do your normal daily activities. This is usually used to diagnose what is causing palpitations/syncope (passing out).  Date & Time: ________________________________________________________________________  [facility] Stress Test  Your caregiver has ordered a Stress Test with nuclear imaging. The purpose of this test is to evaluate the blood supply to your heart muscle. Cardiac stress tests are done to find areas of poor blood flow to the heart by determining the extent of coronary artery disease (CAD).   Please note: these test may take anywhere between 2-4 hours to complete  PLEASE REPORT TO [facility] AT THE FIRST DESK WILL DIRECT YOU WHERE TO GO  Date of Procedure:__Monday May 22 and Tuesday May 23 at 09:00AM___  Arrival Time for Procedure:___Arrive at 08:45AM to register____  Instructions regarding medication:   __X__ : Hold diabetes medication  morning of procedure   PLEASE NOTIFY THE OFFICE AT LEAST 24 HOURS IN ADVANCE IF YOU ARE UNABLE TO KEEP YOUR APPOINTMENT.  [phone] AND  PLEASE NOTIFY NUCLEAR MEDICINE AT [facility] AT LEAST 24 HOURS IN ADVANCE IF YOU ARE UNABLE TO KEEP YOUR APPOINTMENT. [phone]  How to prepare for your Myoview test:   Do not eat or drink after midnight  No caffeine for 24 hours prior to test  No smoking 24 hours prior to test.  Your medication may be taken with water.  If your doctor stopped a medication because of this test, do not take that medication.  Ladies, please do not wear dresses.  Skirts or pants are appropriate. Please wear a short sleeve shirt.  No perfume, cologne or lotion.  Wear comfortable walking shoes. No heels!        Follow-Up: Your physician recommends that you schedule a follow-up appointment after testing to review results.  Date & Time: _________________________________________________________   Any Other Special Instructions Will Be Listed Below (If Applicable).     If you need a refill on your cardiac medications before your next appointment, please call your pharmacy.   Echocardiogram An echocardiogram, or echocardiography, uses sound waves (ultrasound) to produce an image of your heart. The echocardiogram is simple, painless, obtained within a short period of time, and offers valuable information to your health care provider. The images from an echocardiogram can provide information such as:  Evidence of coronary artery disease (CAD).  Heart size.  Heart muscle function.  Heart valve function.  Aneurysm detection.  Evidence of a past heart attack.  Fluid buildup around the heart.  Heart muscle thickening.  Assess heart valve function. LET [facility] CARE PROVIDER KNOW ABOUT:  Any allergies you have.  All medicines you are taking, including vitamins, herbs, eye drops, creams, and over-the-counter medicines.  Previous problems you or  members of your family have had with the use of anesthetics.  Any blood disorders you have.  Previous surgeries you have had.  Medical conditions you have.  Possibility of pregnancy, if this applies. BEFORE THE PROCEDURE  No special preparation is needed. Eat and drink normally.  PROCEDURE   In order to produce an image of your heart, gel will be applied to your chest and a wand-like tool (transducer) will be moved over your chest. The gel will help transmit the sound waves from the transducer. The sound waves will harmlessly bounce off your heart to allow the heart images to be captured in real-time motion. These images will then be recorded.  You may need an IV to receive a medicine that improves the quality of the pictures. AFTER THE PROCEDURE You may return to your normal schedule including diet, activities, and medicines, unless your health care provider tells you otherwise.   This information is not intended to replace advice given to you by your health care provider. Make sure you discuss any questions you have with your health care provider.   Document Released: 01/28/2000 Document Revised: 02/20/2014 Document Reviewed: 10/07/2012 Elsevier Interactive Patient Education 2016 Elsevier Inc.   Holter Monitoring A Holter monitor is a small device that is used to detect abnormal heart rhythms. It clips to your clothing and is connected by wires to flat, sticky disks (electrodes) that attach to your chest. It is worn continuously for 24-48 hours. HOME CARE INSTRUCTIONS  Wear your Holter monitor at all times, even while exercising and sleeping, for as long as directed by your health care provider.  Make sure that the Holter monitor is safely clipped to your clothing or close to your body as recommended by your health care provider.  Do not get the monitor or wires wet.  Do not put body lotion or moisturizer on your chest.  Keep your skin clean.  Keep a diary of your daily  activities, such as walking and doing chores. If you feel that your heartbeat is abnormal or that your heart is fluttering or skipping a beat:  Record what you are doing when it happens.  Record what time of day the symptoms occur.  Return your Holter monitor as directed by your health care provider.  Keep all follow-up visits as directed by your health care provider. This is important. SEEK IMMEDIATE MEDICAL CARE IF:  You feel lightheaded or you faint.  You have trouble breathing.  You feel pain in your chest, upper arm, or jaw.  You feel sick to your stomach and your skin is pale, cool, or damp.  You heartbeat feels unusual or abnormal.   This information is not intended to replace advice given to you by your health care provider. Make sure you discuss any questions you have with your health care provider.   Document Released: 10/29/2003 Document Revised: 02/20/2014 Document Reviewed: 09/08/2013 Elsevier Interactive Patient Education 2016 [place].   Cardiac Nuclear Scanning A cardiac nuclear scan is used to check your heart for problems, such as the following:  A portion of the heart is not getting enough blood.  Part of the heart muscle has died, which happens with a heart attack.  The heart wall is not working normally.  In this test, a radioactive dye (tracer) is injected into your bloodstream. After the tracer has traveled to your heart, a  scanning device is used to measure how much of the tracer is absorbed by or distributed to various areas of your heart. LET [facility] CARE PROVIDER KNOW ABOUT:  Any allergies you have.  All medicines you are taking, including vitamins, herbs, eye drops, creams, and over-the-counter medicines.  Previous problems you or members of your family have had with the use of anesthetics.  Any blood disorders you have.  Previous surgeries you have had.  Medical conditions you have.  RISKS AND COMPLICATIONS Generally, this is a  safe procedure. However, as with any procedure, problems can occur. Possible problems include:   Serious chest pain.  Rapid heartbeat.  Sensation of warmth in your chest. This usually passes quickly. BEFORE THE PROCEDURE Ask your health care provider about changing or stopping your regular medicines. PROCEDURE This procedure is usually done at a hospital and takes 2-4 hours.  An IV tube is inserted into one of your veins.  Your health care provider will inject a small amount of radioactive tracer through the tube.  You will then wait for 20-40 minutes while the tracer travels through your bloodstream.  You will lie down on an exam table so images of your heart can be taken. Images will be taken for about 15-20 minutes.  You will exercise on a treadmill or stationary bike. While you exercise, your heart activity will be monitored with an electrocardiogram (ECG), and your blood pressure will be checked.  If you are unable to exercise, you may be given a medicine to make your heart beat faster.  When blood flow to your heart has peaked, tracer will again be injected through the IV tube.  After 20-40 minutes, you will get back on the exam table and have more images taken of your heart.  When the procedure is over, your IV tube will be removed. AFTER THE PROCEDURE  You will likely be able to leave shortly after the test. Unless your health care provider tells you otherwise, you may return to your normal schedule, including diet, activities, and medicines.  Make sure you find out how and when you will get your test results.   This information is not intended to replace advice given to you by your health care provider. Make sure you discuss any questions you have with your health care provider.   Document Released: 02/25/2004 Document Revised: 02/04/2013 Document Reviewed: 01/08/2013 Elsevier Interactive Patient Education [employer].

## 2015-07-05 ENCOUNTER — Encounter
Admission: RE | Admit: 2015-07-05 | Discharge: 2015-07-05 | Disposition: A | Discharge status: Home / Self Care | Payer: 59 | Source: Ambulatory Visit | Attending: Cardiology | Admitting: Cardiology

## 2015-07-05 DIAGNOSIS — R002 Palpitations: Secondary | ICD-10-CM | POA: Diagnosis not present

## 2015-07-05 DIAGNOSIS — I10 Essential (primary) hypertension: Secondary | ICD-10-CM | POA: Insufficient documentation

## 2015-07-05 DIAGNOSIS — E785 Hyperlipidemia, unspecified: Secondary | ICD-10-CM | POA: Diagnosis not present

## 2015-07-05 DIAGNOSIS — R9431 Abnormal electrocardiogram [ECG] [EKG]: Secondary | ICD-10-CM | POA: Insufficient documentation

## 2015-07-05 MED ORDER — TECHNETIUM TC 99M TETROFOSMIN IV KIT: 30.0000 | PACK | Freq: Once | INTRAVENOUS | Status: DC | PRN

## 2015-07-06 ENCOUNTER — Encounter
Admission: RE | Admit: 2015-07-06 | Discharge: 2015-07-06 | Disposition: A | Discharge status: Home / Self Care | Payer: 59 | Source: Ambulatory Visit | Attending: Cardiology | Admitting: Cardiology

## 2015-07-06 LAB — NM MYOCAR MULTI W/SPECT W/WALL MOTION / EF
CHL CUP NUCLEAR SRS: 0
CHL CUP RESTING HR STRESS: 83 {beats}/min
CSEPEDS: 21 s
Estimated workload: 7 METS
Exercise duration (min): 5 min
LV dias vol: 173 mL (ref 62–150)
LV sys vol: 96 mL
Peak HR: 150 {beats}/min
Percent HR: 91 %
SDS: 1
SSS: 1
TID: 0.95

## 2015-07-06 MED ORDER — TECHNETIUM TC 99M TETROFOSMIN IV KIT
30.0000 | PACK | Freq: Once | INTRAVENOUS | Status: AC | PRN
  Administered 2015-07-06: 32.72 via INTRAVENOUS

## 2015-07-19 ENCOUNTER — Ambulatory Visit (INDEPENDENT_AMBULATORY_CARE_PROVIDER_SITE_OTHER): Payer: PRIVATE HEALTH INSURANCE

## 2015-07-19 ENCOUNTER — Telehealth: Payer: Self-pay | Admitting: Family Medicine

## 2015-07-19 ENCOUNTER — Other Ambulatory Visit: Payer: Self-pay

## 2015-07-19 DIAGNOSIS — I10 Essential (primary) hypertension: Secondary | ICD-10-CM

## 2015-07-19 DIAGNOSIS — R002 Palpitations: Secondary | ICD-10-CM | POA: Diagnosis not present

## 2015-07-19 DIAGNOSIS — E785 Hyperlipidemia, unspecified: Secondary | ICD-10-CM | POA: Diagnosis not present

## 2015-07-19 DIAGNOSIS — R9431 Abnormal electrocardiogram [ECG] [EKG]: Secondary | ICD-10-CM

## 2015-07-19 NOTE — Telephone Encounter (Signed)
Pt states he had is sleep study several weeks ago.  Pt is requesting an order written for his cpap machine and supplies.  This can be sent to [facility].  [phone]

## 2015-07-20 LAB — ECHOCARDIOGRAM COMPLETE
CHL CUP DOP CALC LVOT VTI: 30.9 cm
CHL CUP STROKE VOLUME: 92 mL
E decel time: 257 msec
EERAT: 10.36
FS: 36 % (ref 28–44)
IVS/LV PW RATIO, ED: 1.13
LA ID, A-P, ES: 35 cm
LADIAMINDEX: 1.2 cm/m2
LAVOL: 59.7 cm3
LAVOLA4C: 45.5 mL
LAVOLIN: 20.4 mL/m2
LEFT ATRIUM END SYS DIAM: 35 cm
LV SIMPSON'S DISK: 60
LV TDI E'LATERAL: 6.42
LV dias vol: 152 mL — AB (ref 62–150)
LV e' LATERAL: 6.42 cm/s
LV sys vol index: 21 mL/m2
LVDIAVOLIN: 52 mL/m2
LVEEAVG: 10.36
LVEEMED: 10.36
LVOT area: 3.46 cm2
LVOT diameter: 21 cm
LVOT peak grad rest: 8 mmHg
LVOT peak vel: 139 m/s
LVOTSV: 107 cm3
LVSYSVOL: 60 mL (ref 21–61)
MV Dec: 257
MV pk E vel: 66.5 m/s
MVPKAVEL: 88.8 m/s
PW: 10.5 mm — AB (ref 0.6–1.1)
RV TAPSE: 19.1 cm
TDI e' medial: 5.44

## 2015-07-20 NOTE — Telephone Encounter (Signed)
Did not see sleep study results in chart.   Please check behind me and check up front. Thanks.

## 2015-07-21 ENCOUNTER — Encounter: Payer: Self-pay | Admitting: Podiatry

## 2015-07-21 ENCOUNTER — Ambulatory Visit (INDEPENDENT_AMBULATORY_CARE_PROVIDER_SITE_OTHER): Payer: PRIVATE HEALTH INSURANCE | Admitting: Podiatry

## 2015-07-21 ENCOUNTER — Ambulatory Visit (INDEPENDENT_AMBULATORY_CARE_PROVIDER_SITE_OTHER): Payer: PRIVATE HEALTH INSURANCE

## 2015-07-21 VITALS — BP 156/96 | HR 79 | Resp 16

## 2015-07-21 DIAGNOSIS — B351 Tinea unguium: Secondary | ICD-10-CM | POA: Diagnosis not present

## 2015-07-21 DIAGNOSIS — B353 Tinea pedis: Secondary | ICD-10-CM

## 2015-07-21 DIAGNOSIS — M79676 Pain in unspecified toe(s): Secondary | ICD-10-CM

## 2015-07-21 DIAGNOSIS — E1142 Type 2 diabetes mellitus with diabetic polyneuropathy: Secondary | ICD-10-CM | POA: Diagnosis not present

## 2015-07-21 MED ORDER — NAFTIFINE HCL 2 % EX CREA: 1.0000 "application " | TOPICAL_CREAM | Freq: Every day | CUTANEOUS | Status: DC

## 2015-07-21 MED ORDER — GABAPENTIN 100 MG PO CAPS: 100.0000 mg | ORAL_CAPSULE | Freq: Three times a day (TID) | ORAL | Status: DC

## 2015-07-21 NOTE — Progress Notes (Signed)
   Subjective:    Patient ID: Howard Young, male    DOB: 08-29-1960, 55 y.o.   MRN: [id number]  HPI: He presents today with chief complaint of what he thinks is athlete's foot between his toes. He also states that his left foot he recently injured. He states that it has states sore and swollen over the past few weeks. He states that he has burning across the dorsal aspect of the bilateral foot that he states that his hemoglobin A1c is probably around 6 or 7. He is also concerned about his long thick yellow dystrophic onychomycotic nails.    Review of Systems  Cardiovascular: Positive for palpitations.  All other systems reviewed and are negative.      Objective:   Physical Exam: Vital signs are stable alert and oriented 3 pulses are palpable. Neurologic sensorium is diminished per Semmes-Weinstein monofilament. Deep tendon reflexes are intact. Muscle strength is normal. Orthopedic evaluation of his hammertoe deformities. He has pain and swelling dorsal aspect of the left foot. And that pain to the second toe. Radiographs demonstrate what appears to be a well-healing fracture of the proximal phalanx of the second toe left foot. It doesn't seem that he has a interdigital tinea pedis and thick yellow dystrophic onychomycotic nails.        Assessment & Plan:  Assessment: Diabetic peripheral neuropathy. Tinea pedis bilateral. Fractured second digit left foot. Pain elicited onychomycosis.  Plan: Started him on gabapentin 100 mg 3 times a day. Debrided his nails 1 through 5 bilateral. Also placed him in on Naftin cream for his interdigital tinea pedis.

## 2015-07-23 ENCOUNTER — Ambulatory Visit
Admission: RE | Admit: 2015-07-23 | Discharge: 2015-07-23 | Disposition: A | Discharge status: Home / Self Care | Payer: 59 | Source: Ambulatory Visit | Attending: Cardiology | Admitting: Cardiology

## 2015-07-23 DIAGNOSIS — Z86711 Personal history of pulmonary embolism: Secondary | ICD-10-CM | POA: Diagnosis not present

## 2015-07-23 DIAGNOSIS — R9431 Abnormal electrocardiogram [ECG] [EKG]: Secondary | ICD-10-CM | POA: Diagnosis not present

## 2015-07-23 DIAGNOSIS — E669 Obesity, unspecified: Secondary | ICD-10-CM | POA: Insufficient documentation

## 2015-07-23 DIAGNOSIS — Z9884 Bariatric surgery status: Secondary | ICD-10-CM | POA: Insufficient documentation

## 2015-07-23 DIAGNOSIS — E1140 Type 2 diabetes mellitus with diabetic neuropathy, unspecified: Secondary | ICD-10-CM | POA: Insufficient documentation

## 2015-07-23 DIAGNOSIS — I10 Essential (primary) hypertension: Secondary | ICD-10-CM | POA: Insufficient documentation

## 2015-07-23 DIAGNOSIS — G4730 Sleep apnea, unspecified: Secondary | ICD-10-CM | POA: Diagnosis not present

## 2015-07-23 DIAGNOSIS — R002 Palpitations: Secondary | ICD-10-CM | POA: Diagnosis present

## 2015-07-23 DIAGNOSIS — E785 Hyperlipidemia, unspecified: Secondary | ICD-10-CM | POA: Diagnosis not present

## 2015-07-23 NOTE — Telephone Encounter (Signed)
Pt called back saying Dr. Venia Minks did order his sleep study.  Sleep Study was done at [facility] the number is  [phone].  Pt works at [employer] there.  He did a home test.  The type of machine is a [employer] heated hose, mask of choice.  Send to [facility].   Pt's call back is [phone]    Thanks Con Memos

## 2015-07-23 NOTE — Telephone Encounter (Signed)
Wrote rx. Please fax. Thanks.

## 2015-07-23 NOTE — Telephone Encounter (Signed)
Wrote rx. Please fax. Jerrilyn Cairo

## 2015-07-23 NOTE — Telephone Encounter (Signed)
Order faxed.   Thanks,   -Ryszard Socarras  

## 2015-07-26 ENCOUNTER — Ambulatory Visit (INDEPENDENT_AMBULATORY_CARE_PROVIDER_SITE_OTHER): Payer: PRIVATE HEALTH INSURANCE | Admitting: Cardiology

## 2015-07-26 ENCOUNTER — Encounter: Payer: Self-pay | Admitting: Cardiology

## 2015-07-26 DIAGNOSIS — I493 Ventricular premature depolarization: Secondary | ICD-10-CM

## 2015-07-26 DIAGNOSIS — R002 Palpitations: Secondary | ICD-10-CM

## 2015-07-26 DIAGNOSIS — I456 Pre-excitation syndrome: Secondary | ICD-10-CM

## 2015-07-26 DIAGNOSIS — E785 Hyperlipidemia, unspecified: Secondary | ICD-10-CM | POA: Diagnosis not present

## 2015-07-26 DIAGNOSIS — I10 Essential (primary) hypertension: Secondary | ICD-10-CM

## 2015-07-26 MED ORDER — METOPROLOL SUCCINATE ER 25 MG PO TB24: 50.0000 mg | ORAL_TABLET | Freq: Every day | ORAL | Status: DC

## 2015-07-26 NOTE — Progress Notes (Signed)
Cardiology Office Note   Date:  07/26/2015   ID:  Howard Young, DOB [birth year], MRN [id number]  Referring Doctor:  Margarita Rana, MD   Cardiologist:   Wende Bushy, MD   Reason for consultation:  Chief Complaint  Patient presents with  . other    F/u testing. Meds reviewed verbally.      History of Present Illness: Howard Young is a 55 y.o. male who presents for Palpitations. Patient is here for follow-up after tests.  We went over the results of his Holter. He had the PAT noted around 6:30 in the evening. He responds by saying that was around the time that at 10 power lifting. He did not have any symptoms at that time.  The other ectopy were isolated PACs and PVCs no ventricular runs.  He does not team he checked his blood pressure and so he will start to do this more regularly.  Patient reports that he exercises and does power lifts. He does not note any chest pain or shortness of breath with these activities.  He will be going back to Dr. Hassell Done to be reevaluated for possible gastric bypass. He really wants to do this.  He does have risk factors for CAD including diabetes, hypertension, hyperlipidemia.  Patient denies fever, cough, colds, abdominal pain. He does have sleep apnea and uses CPAP. Denies PND, orthopnea, edema.   ROS:  Please see the history of present illness. Aside from mentioned under HPI, all other systems are reviewed and negative.     Past Medical History  Diagnosis Date  . Morbid obesity ([place])   . Diabetes mellitus without complication ([place])   . GERD (gastroesophageal reflux disease)   . Hypertension     Past Surgical History  Procedure Laterality Date  . Cholecystectomy  2001  . Joint replacement  2012    right hip  . Laparoscopic gastric banding  2008  . Heart ablation  1993  . Breath tek h pylori  04/25/2011    Procedure: BREATH TEK H PYLORI;  Surgeon: Pedro Earls, MD;  Location: [facility];  Service: General;   Laterality: N/A;     reports that he has never smoked. He has never used smokeless tobacco. He reports that he does not drink alcohol or use illicit drugs.   family history includes Cancer in his father, mother, sister, and sister.   Current Outpatient Prescriptions  Medication Sig Dispense Refill  . ALPRAZolam (XANAX) 1 MG tablet Take 1/2 to 1 two times a day as needed. 20 tablet 0  . atorvastatin (LIPITOR) 10 MG tablet Take 1 tablet (10 mg total) by mouth daily. 90 tablet 3  . cyanocobalamin 100 MCG tablet Take 2 tablets by mouth 2 (two) times daily.    Marland Kitchen gabapentin (NEURONTIN) 100 MG capsule Take 100 mg by mouth at bedtime.    . Insulin Glargine (BASAGLAR KWIKPEN) 100 UNIT/ML SOPN INJECT 60 UNITS INTO THE SKIN DAILY AT 10 PM.  3  . Liraglutide (VICTOZA) 18 MG/3ML SOPN Inject 0.1 mLs (0.6 mg total) into the skin every evening. 9 mL 5  . lisinopril-hydrochlorothiazide (PRINZIDE,ZESTORETIC) 20-12.5 MG tablet Take 1 tablet by mouth at bedtime. 90 tablet 1  . metFORMIN (GLUCOPHAGE) 500 MG tablet Take 1 tablet (500 mg total) by mouth 2 (two) times daily with a meal. 180 tablet 1  . Multiple Vitamins-Minerals (MULTIVITAMIN PO) Take by mouth daily.    . Naftifine HCl 2 % CREA Apply 1 application topically daily.  60 g 2  . pantoprazole (PROTONIX) 40 MG tablet Take 1 tablet (40 mg total) by mouth daily. 90 tablet 1  . spironolactone (ALDACTONE) 50 MG tablet Take 50 mg by mouth daily.    . metoprolol succinate (TOPROL XL) 25 MG 24 hr tablet Take 2 tablets (50 mg total) by mouth daily. 60 tablet 6  . [DISCONTINUED] insulin aspart (NOVOLOG) 100 UNIT/ML injection Inject 60 Units into the skin at bedtime. Patient taking 30 - 60 units daily      No current facility-administered medications for this visit.    Allergies: Metoclopramide hcl    PHYSICAL EXAM: VS:  BP 178/84 mmHg  Pulse 91  Ht 6\' 4"  (1.93 m)  Wt 335 lb 4 oz (152.068 kg)  BMI 40.82 kg/m2 , Body mass index is 40.82 kg/(m^2). Wt  Readings from Last 3 Encounters:  07/26/15 335 lb 4 oz (152.068 kg)  06/29/15 337 lb 1.9 oz (152.917 kg)  06/07/15 340 lb (154.223 kg)    GENERAL:  well developed, well nourished, Morbidly obese, not in acute distress HEENT: normocephalic, pink conjunctivae, anicteric sclerae, no xanthelasma, normal dentition, oropharynx clear NECK:  no neck vein engorgement, JVP normal, no hepatojugular reflux, carotid upstroke brisk and symmetric, no bruit, no thyromegaly, no lymphadenopathy LUNGS:  good respiratory effort, clear to auscultation bilaterally CV:  PMI not displaced, no thrills, no lifts, S1 and S2 within normal limits, no palpable S3 or S4, no murmurs, no rubs, no gallops ABD:  Soft, nontender, nondistended, normoactive bowel sounds, no abdominal aortic bruit, no hepatomegaly, no splenomegaly MS: nontender back, no kyphosis, no scoliosis, no joint deformities EXT:  2+ DP/PT pulses, no edema, no varicosities, no cyanosis, no clubbing SKIN: warm, nondiaphoretic, normal turgor, no ulcers NEUROPSYCH: alert, oriented to person, place, and time, sensory/motor grossly intact, normal mood, appropriate affect  Recent Labs: 06/08/2015: ALT 56; BUN 21*; Creatinine, Ser 1.24; Hemoglobin 16.0; Platelets 194; Potassium 4.2; Sodium 135; TSH 1.790   Lipid Panel    Component Value Date/Time   CHOL 187 06/08/2015 1358   CHOL 196 06/12/2014 1204   TRIG 137 06/08/2015 1358   TRIG 157* 06/12/2014 1204   HDL 41 06/08/2015 1358   HDL 41 06/12/2014 1204   CHOLHDL 4.6 06/08/2015 1358   VLDL 27 06/08/2015 1358   VLDL 31 06/12/2014 1204   LDLCALC 119* 06/08/2015 1358   LDLCALC 124* 06/12/2014 1204     Other studies Reviewed:  EKG:  The ekg from 06/29/2015 was personally reviewed by me and it revealed sinus rhythm, 83 BPM. PVCs. Q waves in V1 and V2.  Additional studies/ records that were reviewed personally reviewed by me today include:   echo 07/19/2015: - Left ventricle: The cavity size was normal.  Wall thickness was  increased in a pattern of mild LVH. Systolic function was normal.  The estimated ejection fraction was in the range of 55% to 60%.  Wall motion was normal; there were no regional wall motion  abnormalities. Left ventricular diastolic function parameters  were normal.  Holter 07/19/2015: Overall rhythm was sinus. The heart rate ranged from 55-196 BPM, average of 84 BPM.  ventricular ectopy: 6193 isolated PVCs. 132 ventricular couplets. 662 bigeminal cycles.no ventricular runs  Supraventricular ectopy: 362 isolated PACs. 39 atrial couplets. 10 bigeminal cycles. 14 runs totaling 64 beats. These were short runs of what appeared to be paroxysmal atrial tachycardia. fastest being 3 beat run at 241 BPM.  No detected atrial fibrillation.     Exercise nuclear stress  test 07/06/2015: Exercise myocardial perfusion imaging study with no significant Ischemia GI uptake artifact noted Normal wall motion, EF estimated at 45% (mildly depressed, likely secondary to attenuation artifact) No EKG changes concerning for ischemia at peak stress or in recovery. Baseline EKG with poor R-wave progression through the anterior precordial leads, PVCs noted with stress Low risk scan  ASSESSMENT AND PLAN:  Palpitations Note of PVCs on EKG Runs of PAT On Holter. Please see results as noted above. Discussed trial with beta blocker metoprolol XL 50 mg daily. He will be taking 25 mg daily for the first week. We also discussed importance of checking his blood pressure before doing any power left says it may be elevated and out of control and that may be contributing to all the ectopy. No ischemia noted on stress test.  Abnormal EKG with Q waves in V1 and V2 May be related to obesity LV ejection fraction was preserved and echocardiogram No ischemia noted on stress test. This pertains a low likelihood of clinically significant CAD. Discussed results with patient.  Hypertension We'll be  starting metoprolol.Recommend blood pressure log. Continue medications for now.patient to follow-up in 2 months. Patient, office if blood pressures still 1 140/80. We have discussed importance of laying off on power lifting for now and focusing more on cardio workouts.  Hyperlipidemia  LDL being followed by PCP. Recommended goal is less than 70 due to diabetes.  Obesity Body mass index is 40.82 kg/(m^2).Marland Kitchen Recommend aggressive weight loss through diet and increased physical activity. Once cardiac workup is done.   Current medicines are reviewed at length with the patient today.  The patient does not have concerns regarding medicines.  Labs/ tests ordered today include:  Orders Placed This Encounter  Procedures  . EKG 12-Lead    I had a lengthy and detailed discussion with the patient regarding diagnoses, prognosis, diagnostic options, treatment options .   I counseled the patient on importance of lifestyle modification including heart healthy diet, regular physical activity    Disposition:   FU with undersigned in 2 months  Signed, Wende Bushy, MD  07/26/2015 3:15 PM    [place]

## 2015-07-26 NOTE — Patient Instructions (Signed)
Medication Instructions:  Your physician has recommended you make the following change in your medication:  1. Start Metoprolol 25 mg 2 tablets once daily   Labwork: None ordered  Testing/Procedures: None ordered  Follow-Up: Your physician recommends that you schedule a follow-up appointment in: 8 weeks with Dr. Yvone Neu.  Date & Time: _______________________________________________________________   Any Other Special Instructions Will Be Listed Below (If Applicable).     If you need a refill on your cardiac medications before your next appointment, please call your pharmacy.

## 2015-07-29 ENCOUNTER — Encounter: Payer: Self-pay | Admitting: Family Medicine

## 2015-08-10 ENCOUNTER — Ambulatory Visit: Payer: PRIVATE HEALTH INSURANCE | Admitting: Family Medicine

## 2015-08-16 ENCOUNTER — Encounter: Payer: Self-pay | Admitting: Family Medicine

## 2015-08-16 ENCOUNTER — Ambulatory Visit (INDEPENDENT_AMBULATORY_CARE_PROVIDER_SITE_OTHER): Payer: PRIVATE HEALTH INSURANCE | Admitting: Family Medicine

## 2015-08-16 VITALS — BP 118/80 | Temp 98.5°F | Resp 16 | Ht 76.0 in | Wt 336.0 lb

## 2015-08-16 DIAGNOSIS — E1340 Other specified diabetes mellitus with diabetic neuropathy, unspecified: Secondary | ICD-10-CM | POA: Diagnosis not present

## 2015-08-16 DIAGNOSIS — G4730 Sleep apnea, unspecified: Secondary | ICD-10-CM

## 2015-08-16 DIAGNOSIS — I493 Ventricular premature depolarization: Secondary | ICD-10-CM

## 2015-08-16 DIAGNOSIS — E0840 Diabetes mellitus due to underlying condition with diabetic neuropathy, unspecified: Secondary | ICD-10-CM | POA: Diagnosis not present

## 2015-08-16 DIAGNOSIS — E669 Obesity, unspecified: Secondary | ICD-10-CM

## 2015-08-16 LAB — POCT GLYCOSYLATED HEMOGLOBIN (HGB A1C)
ESTIMATED AVERAGE GLUCOSE: 220
Hemoglobin A1C: 9.3

## 2015-08-16 MED ORDER — LIRAGLUTIDE 18 MG/3ML ~~LOC~~ SOPN: 1.2000 mg | PEN_INJECTOR | Freq: Every evening | SUBCUTANEOUS | Status: DC

## 2015-08-16 MED ORDER — ATORVASTATIN CALCIUM 20 MG PO TABS: 20.0000 mg | ORAL_TABLET | Freq: Every day | ORAL | Status: DC

## 2015-08-16 NOTE — Progress Notes (Signed)
Patient: Howard Young Male    DOB: 11-14-1960   55 y.o.   MRN: [id number] Visit Date: 08/16/2015  Today's Provider: Lelon Huh, MD   Chief Complaint  Patient presents with  . Follow-up  . Diabetes  . Hyperlipidemia  . Hypertension   Subjective:    HPI   Palpitations:   Diabetes Mellitus Type II, Follow-up:   Lab Results  Component Value Date   HGBA1C 10.0 03/22/2015   HGBA1C 8.5* 06/12/2014   Last seen for diabetes 3 months ago.  Management since then includes; labs improved, referred to podiatry due to scaly lesion between toes. He reports good compliance with treatment. He had been using 1.2 Victoza until prescription started low about a month ago, and has since been using 0.6.  He is not having side effects. none Current symptoms include none and have been resolved. Home blood sugar records: fasting range: 120-150  Episodes of hypoglycemia? no   Current Insulin Regimen: 50 units qd prn Most Recent Eye Exam: due Weight trend: stable Prior visit with dietician: no Current diet: well balanced Current exercise: cardiovascular workout on exercise equipment  ----------------------------------------------------------------------    Hypertension, follow-up:  BP Readings from Last 3 Encounters:  08/16/15 118/80  07/26/15 178/84  07/21/15 156/96    He was last seen for hypertension 3 months ago.  BP at that visit was 150/98. Management since that visit includes; no changes.He reports good compliance with treatment. He is not having side effects. none  He is exercising. He is not adherent to low salt diet.   Outside blood pressures are 160/90. He is experiencing none.  Patient denies none.   Cardiovascular risk factors include diabetes mellitus.  Use of agents associated with hypertension: none.   ----------------------------------------------------------------------    Lipid/Cholesterol, Follow-up:   Last seen for this 3 months ago.    Management since that visit includes; labs done, started atorvastatin 10 mg qd which he states he is taking and tolerating well.   Last Lipid Panel:    Component Value Date/Time   CHOL 187 06/08/2015 1358   CHOL 196 06/12/2014 1204   TRIG 137 06/08/2015 1358   TRIG 157* 06/12/2014 1204   HDL 41 06/08/2015 1358   HDL 41 06/12/2014 1204   CHOLHDL 4.6 06/08/2015 1358   VLDL 27 06/08/2015 1358   VLDL 31 06/12/2014 1204   LDLCALC 119* 06/08/2015 1358   LDLCALC 124* 06/12/2014 1204    He reports good compliance with treatment. He is not having side effects. none  Wt Readings from Last 3 Encounters:  08/16/15 336 lb (152.409 kg)  07/26/15 335 lb 4 oz (152.068 kg)  06/29/15 337 lb 1.9 oz (152.917 kg)    ----------------------------------------------------------------------  OSA He has had CPAP machine for several year which is no longer working. He has home sleep test done on room air in may confirming OSA and needs order for new CPAP machine He does not remember setting on previous machine but requests auto=itration.    Allergies  Allergen Reactions  . Metoclopramide Hcl Shortness Of Breath and Anxiety   Current Meds  Medication Sig  . ALPRAZolam (XANAX) 1 MG tablet Take 1/2 to 1 two times a day as needed.  Marland Kitchen atorvastatin (LIPITOR) 10 MG tablet Take 1 tablet (10 mg total) by mouth daily.  . cyanocobalamin 100 MCG tablet Take 2 tablets by mouth 2 (two) times daily.  Marland Kitchen gabapentin (NEURONTIN) 100 MG capsule Take 100 mg by mouth at  bedtime.  . Insulin Glargine (BASAGLAR KWIKPEN) 100 UNIT/ML SOPN INJECT 60 UNITS INTO THE SKIN DAILY AT 10 PM.  . Liraglutide (VICTOZA) 18 MG/3ML SOPN Inject 0.1 mLs (0.6 mg total) into the skin every evening.  Marland Kitchen lisinopril-hydrochlorothiazide (PRINZIDE,ZESTORETIC) 20-12.5 MG tablet Take 1 tablet by mouth at bedtime.  . metFORMIN (GLUCOPHAGE) 500 MG tablet Take 1 tablet (500 mg total) by mouth 2 (two) times daily with a meal.  . metoprolol succinate  (TOPROL XL) 25 MG 24 hr tablet Take 2 tablets (50 mg total) by mouth daily.  . Multiple Vitamins-Minerals (MULTIVITAMIN PO) Take by mouth daily.  . Naftifine HCl 2 % CREA Apply 1 application topically daily.  . pantoprazole (PROTONIX) 40 MG tablet Take 1 tablet (40 mg total) by mouth daily.  Marland Kitchen spironolactone (ALDACTONE) 50 MG tablet Take 50 mg by mouth daily.    Review of Systems  Constitutional: Negative for fever, chills and appetite change.  Respiratory: Negative for chest tightness, shortness of breath and wheezing.   Cardiovascular: Negative for chest pain and palpitations.  Gastrointestinal: Negative for nausea, vomiting and abdominal pain.    Social History  Substance Use Topics  . Smoking status: Never Smoker   . Smokeless tobacco: Never Used  . Alcohol Use: No   Objective:   BP 118/80 mmHg  Temp(Src) 98.5 F (36.9 C) (Oral)  Resp 16  Ht 6\' 4"  (1.93 m)  Wt 336 lb (152.409 kg)  BMI 40.92 kg/m2  Physical Exam  General Appearance:    Alert, cooperative, no distress, obese  Eyes:    PERRL, conjunctiva/corneas clear, EOM's intact       Lungs:     Clear to auscultation bilaterally, respirations unlabored  Heart:    Regular rate and rhythm  Neurologic:   Awake, alert, oriented x 3. No apparent focal neurological           defect.       Results for orders placed or performed in visit on 08/16/15  POCT glycosylated hemoglobin (Hb A1C)  Result Value Ref Range   Hemoglobin A1C 9.3    Est. average glucose Bld gHb Est-mCnc 220         Assessment & Plan:     1. Diabetes mellitus due to underlying condition with diabetic neuropathy, unspecified long term insulin use status (HCC) Improving, but not to goal. Go back up to 1.2 Victoza. Advised to cut  Back on basaglar if fastings start to get under 100 - POCT glycosylated hemoglobin (Hb A1C)  2. Other specified diabetes mellitus with diabetic neuropathy (HCC)  - Liraglutide (VICTOZA) 18 MG/3ML SOPN; Inject 0.2 mLs (1.2 mg  total) into the skin every evening.  Dispense: 18 mL; Refill: 5  3. PVC (premature ventricular contraction) Well controlled on betablocker  4. Apnea, sleep CPAP machine no longer working. Printed order for new CPAP with auto-titration to be faxed to [facility].   5. Adiposity  Return in about 3 months (around 11/16/2015).        Lelon Huh, MD  [facility]

## 2015-08-20 ENCOUNTER — Other Ambulatory Visit: Payer: Self-pay | Admitting: *Deleted

## 2015-08-20 DIAGNOSIS — I10 Essential (primary) hypertension: Secondary | ICD-10-CM

## 2015-08-20 MED ORDER — GABAPENTIN 100 MG PO CAPS: 100.0000 mg | ORAL_CAPSULE | Freq: Three times a day (TID) | ORAL | Status: DC

## 2015-08-20 NOTE — Telephone Encounter (Signed)
[facility] sent a refill request for Gabapentin.  Dr. Marta Antu one refill.  Will re-evaluate patient at next appointment scheduled for 09/06/2015.

## 2015-09-06 ENCOUNTER — Ambulatory Visit: Payer: PRIVATE HEALTH INSURANCE | Admitting: Podiatry

## 2015-09-13 ENCOUNTER — Other Ambulatory Visit: Payer: Self-pay

## 2015-09-13 DIAGNOSIS — I10 Essential (primary) hypertension: Secondary | ICD-10-CM

## 2015-09-13 MED ORDER — METOPROLOL SUCCINATE ER 25 MG PO TB24: 50.0000 mg | ORAL_TABLET | Freq: Every day | ORAL | 2 refills | Status: DC

## 2015-09-14 ENCOUNTER — Other Ambulatory Visit: Payer: Self-pay

## 2015-09-14 DIAGNOSIS — I10 Essential (primary) hypertension: Secondary | ICD-10-CM

## 2015-09-14 MED ORDER — METOPROLOL SUCCINATE ER 25 MG PO TB24: 50.0000 mg | ORAL_TABLET | Freq: Every day | ORAL | 2 refills | Status: DC

## 2015-09-15 ENCOUNTER — Other Ambulatory Visit: Payer: Self-pay | Admitting: Family Medicine

## 2015-09-15 DIAGNOSIS — E1340 Other specified diabetes mellitus with diabetic neuropathy, unspecified: Secondary | ICD-10-CM

## 2015-09-15 MED ORDER — LIRAGLUTIDE 18 MG/3ML ~~LOC~~ SOPN
1.2000 mg | PEN_INJECTOR | Freq: Every evening | SUBCUTANEOUS | 3 refills | Status: DC
Start: 2015-09-15 — End: 2015-11-22

## 2015-09-15 NOTE — Telephone Encounter (Signed)
Pt needs refill on needles for Victosa.  [facility] uses [facility] [place]  Pt's call back is [phone]  Thanks Con Memos

## 2015-09-17 ENCOUNTER — Telehealth: Payer: Self-pay | Admitting: *Deleted

## 2015-09-17 ENCOUNTER — Other Ambulatory Visit: Payer: Self-pay | Admitting: *Deleted

## 2015-09-17 DIAGNOSIS — I10 Essential (primary) hypertension: Secondary | ICD-10-CM

## 2015-09-17 MED ORDER — METOPROLOL SUCCINATE ER 25 MG PO TB24: 50.0000 mg | ORAL_TABLET | Freq: Every day | ORAL | 2 refills | Status: DC

## 2015-09-17 MED ORDER — GABAPENTIN 100 MG PO CAPS: 100.0000 mg | ORAL_CAPSULE | Freq: Three times a day (TID) | ORAL | 0 refills | Status: DC

## 2015-09-17 NOTE — Telephone Encounter (Signed)
Faxed request for Gabapentin 100mg  as 90 day rx.  Dr. Marta Antu.

## 2015-09-21 ENCOUNTER — Encounter (INDEPENDENT_AMBULATORY_CARE_PROVIDER_SITE_OTHER): Payer: Self-pay

## 2015-09-21 ENCOUNTER — Encounter: Payer: Self-pay | Admitting: Cardiology

## 2015-09-21 ENCOUNTER — Ambulatory Visit (INDEPENDENT_AMBULATORY_CARE_PROVIDER_SITE_OTHER): Payer: PRIVATE HEALTH INSURANCE | Admitting: Cardiology

## 2015-09-21 VITALS — BP 140/98 | HR 90 | Ht 76.0 in | Wt 333.2 lb

## 2015-09-21 DIAGNOSIS — I10 Essential (primary) hypertension: Secondary | ICD-10-CM

## 2015-09-21 DIAGNOSIS — I493 Ventricular premature depolarization: Secondary | ICD-10-CM | POA: Diagnosis not present

## 2015-09-21 DIAGNOSIS — E785 Hyperlipidemia, unspecified: Secondary | ICD-10-CM | POA: Diagnosis not present

## 2015-09-21 MED ORDER — METOPROLOL SUCCINATE ER 50 MG PO TB24: 50.0000 mg | ORAL_TABLET | Freq: Every day | ORAL | 3 refills | Status: DC

## 2015-09-21 NOTE — Patient Instructions (Signed)
Medication Instructions:  Your physician has recommended you make the following change in your medication:  1. Increased Metoprolol to 50 mg once daily 2. STOP lisinopril-hydrochlorothiazide   Follow-Up: Your physician recommends that you schedule a follow-up appointment in: 3 months with Dr. Yvone Neu.  It was a pleasure seeing you today here in the office. Please do not hesitate to give [place] a call back if you have any further questions. [place], BSN

## 2015-09-21 NOTE — Progress Notes (Signed)
Cardiology Office Note   Date:  09/21/2015   ID:  Howard Young, DOB November 07, 1960, MRN [id number]  Referring Doctor:  Lelon Huh, MD   Cardiologist:   Wende Bushy, MD   Reason for consultation:  Chief Complaint  Patient presents with  . Other    8 week follow up. Meds reviewed by the patient verbally. The patient cut back the Metoprolol to 25 mg one tablet daily due to dizziness.       History of Present Illness: Howard Young is a 55 y.o. male who presents forFollow-up after medication change, for hypertension,  Since going up on the dose of metoprolol, eyes noted his blood pressure to drop to the [id number] systolic with lightheadedness upon bending down. Because of that, he went back to his 25 mg dose. He is modified his exercise regimen doing lower weights with increased repetition.  He does not note any chest pain or shortness of breath with these activities.   Patient denies fever, cough, colds, abdominal pain. He does have sleep apnea and uses CPAP. He is working with the DME company to get a new machine because the first one he had is malfunctioning. Denies PND, orthopnea, edema.   ROS:  Please see the history of present illness. Aside from mentioned under HPI, all other systems are reviewed and negative.     Past Medical History:  Diagnosis Date  . Diabetes mellitus without complication ([place])   . GERD (gastroesophageal reflux disease)   . Hypertension   . Morbid obesity ([place])     Past Surgical History:  Procedure Laterality Date  . BREATH TEK H PYLORI  04/25/2011   Procedure: BREATH TEK H PYLORI;  Surgeon: Pedro Earls, MD;  Location: [facility];  Service: General;  Laterality: N/A;  . CHOLECYSTECTOMY  2001  . heart ablation  1993  . JOINT REPLACEMENT  2012   right hip  . LAPAROSCOPIC GASTRIC BANDING  2008     reports that he has never smoked. He has never used smokeless tobacco. He reports that he does not drink alcohol or use drugs.   family  history includes Cancer in his father, mother, sister, and sister.   Current Outpatient Prescriptions  Medication Sig Dispense Refill  . ALPRAZolam (XANAX) 1 MG tablet Take 1/2 to 1 two times a day as needed. 20 tablet 0  . atorvastatin (LIPITOR) 20 MG tablet Take 1 tablet (20 mg total) by mouth daily. 90 tablet 3  . BD PEN NEEDLE NANO U/F 32G X 4 MM MISC TO USE WITH VICTOZA EVERY EVENING 100 each 4  . cyanocobalamin 100 MCG tablet Take 2 tablets by mouth 2 (two) times daily.    Marland Kitchen gabapentin (NEURONTIN) 100 MG capsule Take 1 capsule (100 mg total) by mouth 3 (three) times daily. (Patient taking differently: Take 100 mg by mouth daily. ) 180 capsule 0  . Insulin Glargine (BASAGLAR KWIKPEN) 100 UNIT/ML SOPN INJECT 60 UNITS INTO THE SKIN DAILY AT 10 PM.  3  . Liraglutide (VICTOZA) 18 MG/3ML SOPN Inject 0.2 mLs (1.2 mg total) into the skin every evening. 18 mL 3  . lisinopril-hydrochlorothiazide (PRINZIDE,ZESTORETIC) 20-12.5 MG tablet Take 1 tablet by mouth at bedtime. 90 tablet 1  . metFORMIN (GLUCOPHAGE) 500 MG tablet Take 1 tablet (500 mg total) by mouth 2 (two) times daily with a meal. 180 tablet 1  . metoprolol succinate (TOPROL XL) 25 MG 24 hr tablet Take 2 tablets (50 mg total) by mouth daily. (  Patient taking differently: Take 25 mg by mouth daily. ) 180 tablet 2  . Multiple Vitamins-Minerals (MULTIVITAMIN PO) Take by mouth daily.    . Naftifine HCl 2 % CREA Apply 1 application topically daily. 60 g 2  . pantoprazole (PROTONIX) 40 MG tablet Take 1 tablet (40 mg total) by mouth daily. 90 tablet 1  . spironolactone (ALDACTONE) 50 MG tablet Take 50 mg by mouth daily.     No current facility-administered medications for this visit.     Allergies: Metoclopramide hcl    PHYSICAL EXAM: VS:  BP (!) 140/98 (BP Location: Left Arm, Patient Position: Sitting, Cuff Size: Normal)   Pulse 90   Ht 6\' 4"  (1.93 m)   Wt (!) 333 lb 4 oz (151.2 kg)   BMI 40.56 kg/m  , Body mass index is 40.56 kg/m. Wt  Readings from Last 3 Encounters:  09/21/15 (!) 333 lb 4 oz (151.2 kg)  08/16/15 (!) 336 lb (152.4 kg)  07/26/15 (!) 335 lb 4 oz (152.1 kg)    GENERAL:  well developed, well nourished, Morbidly obese, not in acute distress HEENT: normocephalic, pink conjunctivae, anicteric sclerae, no xanthelasma, normal dentition, oropharynx clear NECK:  no neck vein engorgement, JVP normal, no hepatojugular reflux, carotid upstroke brisk and symmetric, no bruit, no thyromegaly, no lymphadenopathy LUNGS:  good respiratory effort, clear to auscultation bilaterally CV:  PMI not displaced, no thrills, no lifts, S1 and S2 within normal limits, no palpable S3 or S4, no murmurs, no rubs, no gallops ABD:  Soft, nontender, nondistended, normoactive bowel sounds, no abdominal aortic bruit, no hepatomegaly, no splenomegaly MS: nontender back, no kyphosis, no scoliosis, no joint deformities EXT:  2+ DP/PT pulses, no edema, no varicosities, no cyanosis, no clubbing SKIN: warm, nondiaphoretic, normal turgor, no ulcers NEUROPSYCH: alert, oriented to person, place, and time, sensory/motor grossly intact, normal mood, appropriate affect  Recent Labs: 06/08/2015: ALT 56; BUN 21; Creatinine, Ser 1.24; Hemoglobin 16.0; Platelets 194; Potassium 4.2; Sodium 135; TSH 1.790   Lipid Panel    Component Value Date/Time   CHOL 187 06/08/2015 1358   CHOL 196 06/12/2014 1204   TRIG 137 06/08/2015 1358   TRIG 157 (H) 06/12/2014 1204   HDL 41 06/08/2015 1358   HDL 41 06/12/2014 1204   CHOLHDL 4.6 06/08/2015 1358   VLDL 27 06/08/2015 1358   VLDL 31 06/12/2014 1204   LDLCALC 119 (H) 06/08/2015 1358   LDLCALC 124 (H) 06/12/2014 1204     Other studies Reviewed:  EKG:  The ekg from 06/29/2015 was personally reviewed by me and it revealed sinus rhythm, 83 BPM. PVCs. Q waves in V1 and V2.  EKG from 09/21/2015 was personally reviewed by me and it revealed sinus rhythm, 92 BPM. Occasional PVCs. Q waves in V1 and V2.  Additional  studies/ records that were reviewed personally reviewed by me today include:   echo 07/19/2015: - Left ventricle: The cavity size was normal. Wall thickness was  increased in a pattern of mild LVH. Systolic function was normal.  The estimated ejection fraction was in the range of 55% to 60%.  Wall motion was normal; there were no regional wall motion  abnormalities. Left ventricular diastolic function parameters  were normal.  Holter 07/19/2015: Overall rhythm was sinus. The heart rate ranged from 55-196 BPM, average of 84 BPM.  ventricular ectopy: 6193 isolated PVCs. 132 ventricular couplets. 662 bigeminal cycles.no ventricular runs  Supraventricular ectopy: 362 isolated PACs. 39 atrial couplets. 10 bigeminal cycles. 14 runs totaling  64 beats. These were short runs of what appeared to be paroxysmal atrial tachycardia. fastest being 3 beat run at 241 BPM.  No detected atrial fibrillation.     Exercise nuclear stress test 07/06/2015: Exercise myocardial perfusion imaging study with no significant Ischemia GI uptake artifact noted Normal wall motion, EF estimated at 45% (mildly depressed, likely secondary to attenuation artifact) No EKG changes concerning for ischemia at peak stress or in recovery. Baseline EKG with poor R-wave progression through the anterior precordial leads, PVCs noted with stress Low risk scan  ASSESSMENT AND PLAN:  Palpitations Note of PVCs on EKG Runs of PAT On Holter. Please see results as noted above. Discussed retrial with beta blocker metoprolol XL 50 mg daily.  Will hold ACEI/HCTZ for now. He will inform our office in a couple of weeks if the blood pressure is not too low, we will start him on lisinopril 5 g by mouth daily due to his history of diabetes.  No ischemia noted on stress test.  Abnormal EKG with Q waves in V1 and V2 May be related to obesity LV ejection fraction was preserved and echocardiogram No ischemia noted on stress test.  This pertains a low likelihood of clinically significant CAD.   Hypertension Recommend blood pressure log. Continue medications for now as described above. .patient to follow-up in 3 months. Blood pressure goal is < 140/80. We have discussed importance of laying off on power lifting for now and focusing more on cardio workouts.  Hyperlipidemia  LDL being followed by PCP. Recommended goal is less than 70 due to diabetes.  Obesity Body mass index is 40.56 kg/m.Marland Kitchen Recommend aggressive weight loss through diet and increased physical activity.   Current medicines are reviewed at length with the patient today.  The patient does not have concerns regarding medicines.  Labs/ tests ordered today include:  Orders Placed This Encounter  Procedures  . EKG 12-Lead    I had a lengthy and detailed discussion with the patient regarding diagnoses, prognosis, diagnostic options, treatment options .   I counseled the patient on importance of lifestyle modification including heart healthy diet, regular physical activity    Disposition:   FU with undersigned in 56months  Signed, Wende Bushy, MD  09/21/2015 2:11 PM    [place]

## 2015-09-30 ENCOUNTER — Other Ambulatory Visit: Payer: Self-pay | Admitting: Family Medicine

## 2015-10-01 ENCOUNTER — Telehealth: Payer: Self-pay | Admitting: Cardiology

## 2015-10-01 NOTE — Telephone Encounter (Signed)
-----   Message from Valora Corporal, RN sent at 09/30/2015  5:15 PM EDT ----- Patient had FU in 3 months at last office visit on 09/21/15. I didn't see anything scheduled and wanted to make sure we didn't miss something. Thanks so much    [employer]

## 2015-10-01 NOTE — Telephone Encounter (Signed)
LOV with you 08/16/2015. Emily Drozdowski, CMA  

## 2015-10-01 NOTE — Telephone Encounter (Signed)
Lmov for patient to call back and schedule 71m fu from 09/21/15 with Dr Yvone Neu

## 2015-10-19 ENCOUNTER — Other Ambulatory Visit: Payer: Self-pay | Admitting: Family Medicine

## 2015-10-19 DIAGNOSIS — E1340 Other specified diabetes mellitus with diabetic neuropathy, unspecified: Secondary | ICD-10-CM

## 2015-10-21 NOTE — Telephone Encounter (Signed)
Called patient to make 4m fu apt Pt states he is still figuring out his work schedule and will call back when he can make this apt

## 2015-10-28 ENCOUNTER — Other Ambulatory Visit: Payer: Self-pay | Admitting: Family Medicine

## 2015-10-29 ENCOUNTER — Other Ambulatory Visit: Payer: Self-pay | Admitting: Family Medicine

## 2015-10-29 ENCOUNTER — Other Ambulatory Visit: Payer: Self-pay

## 2015-10-29 DIAGNOSIS — E1340 Other specified diabetes mellitus with diabetic neuropathy, unspecified: Secondary | ICD-10-CM

## 2015-10-29 MED ORDER — INSULIN PEN NEEDLE 32G X 4 MM MISC: 4 refills | Status: DC

## 2015-10-29 NOTE — Telephone Encounter (Signed)
This prescription was sent in on 09/17/2015 for a quantity of 100. [facility] sent a fax stating patient request new prescription. Current prescription states "Use with Victoza every evening". Patient told the pharmacy that he also uses pen needles with his insulin. Pharmacy requires a new prescription with the sig (use  Twice daily). Please advise if its ok to send in a new prescription with this sig.

## 2015-11-19 ENCOUNTER — Other Ambulatory Visit: Payer: Self-pay | Admitting: Family Medicine

## 2015-11-19 DIAGNOSIS — I10 Essential (primary) hypertension: Secondary | ICD-10-CM

## 2015-11-19 NOTE — Telephone Encounter (Signed)
Please review-aa 

## 2015-11-22 ENCOUNTER — Ambulatory Visit (INDEPENDENT_AMBULATORY_CARE_PROVIDER_SITE_OTHER): Payer: PRIVATE HEALTH INSURANCE | Admitting: Family Medicine

## 2015-11-22 ENCOUNTER — Encounter: Payer: Self-pay | Admitting: Family Medicine

## 2015-11-22 VITALS — BP 142/78 | HR 80 | Temp 98.4°F | Resp 16 | Ht 76.0 in | Wt 342.0 lb

## 2015-11-22 DIAGNOSIS — I10 Essential (primary) hypertension: Secondary | ICD-10-CM | POA: Diagnosis not present

## 2015-11-22 DIAGNOSIS — Z23 Encounter for immunization: Secondary | ICD-10-CM | POA: Diagnosis not present

## 2015-11-22 DIAGNOSIS — I493 Ventricular premature depolarization: Secondary | ICD-10-CM | POA: Diagnosis not present

## 2015-11-22 DIAGNOSIS — E0840 Diabetes mellitus due to underlying condition with diabetic neuropathy, unspecified: Secondary | ICD-10-CM | POA: Diagnosis not present

## 2015-11-22 LAB — POCT GLYCOSYLATED HEMOGLOBIN (HGB A1C)
ESTIMATED AVERAGE GLUCOSE: 200
Hemoglobin A1C: 8.6

## 2015-11-22 MED ORDER — LIRAGLUTIDE 18 MG/3ML ~~LOC~~ SOPN: 1.8000 mg | PEN_INJECTOR | Freq: Every evening | SUBCUTANEOUS | 4 refills | Status: DC

## 2015-11-22 NOTE — Progress Notes (Signed)
Patient: Howard Young Male    DOB: [birth year]   55 y.o.   MRN: [id number] Visit Date: 11/22/2015  Today's Provider: Lelon Huh, MD   Chief Complaint  Patient presents with  . Diabetes    3 month follow up   . Hypertension    6 month follow up   . PVC   Subjective:    HPI  Diabetes Mellitus Type II, Follow-up:   Lab Results  Component Value Date   HGBA1C 8.6 11/22/2015   HGBA1C 9.3 08/16/2015   HGBA1C 10.0 03/22/2015    Last seen for diabetes 3 months ago.  Management since then includes increasing Victoza to 1.2. He reports fair compliance with treatment.  However he has not been taking Basaglar Had been taking 70 units of Novolog at bedtime instead of Levemir the last few months.  He is not having side effects.  Current symptoms include none and have been stable. Home blood sugar records: fasting range: 234  Episodes of hypoglycemia? no   Weight trend: stable Prior visit with dietician: no Current diet: well balanced Current exercise: walking  Pertinent Labs:    Component Value Date/Time   CHOL 187 06/08/2015 1358   CHOL 196 06/12/2014 1204   TRIG 137 06/08/2015 1358   TRIG 157 (H) 06/12/2014 1204   HDL 41 06/08/2015 1358   HDL 41 06/12/2014 1204   LDLCALC 119 (H) 06/08/2015 1358   LDLCALC 124 (H) 06/12/2014 1204   CREATININE 1.24 06/08/2015 1358   CREATININE 1.38 (H) 06/12/2014 1204    Wt Readings from Last 3 Encounters:  11/22/15 (!) 342 lb (155.1 kg)  09/21/15 (!) 333 lb 4 oz (151.2 kg)  08/16/15 (!) 336 lb (152.4 kg)    ------------------------------------------------------------------------   Hypertension, follow-up:  BP Readings from Last 3 Encounters:  11/22/15 (!) 142/78  09/21/15 (!) 140/98  08/16/15 118/80    He was last seen for hypertension 6 months ago.  BP at that visit was 150/98. Management since that visit includes no changes. He reports good compliance with treatment. He is not having side effects. He  is exercising. He is adherent to low salt diet.   Outside blood pressures are checked occasionally. Patient reports that he has a wrist cuff, but feels it does not give him accurate readings.  Patient denies chest pain, chest pressure/discomfort, exertional chest pressure/discomfort and palpitations.   Cardiovascular risk factors include diabetes mellitus, dyslipidemia and obesity (BMI >= 30 kg/m2).   Weight trend: stable Wt Readings from Last 3 Encounters:  11/22/15 (!) 342 lb (155.1 kg)  09/21/15 (!) 333 lb 4 oz (151.2 kg)  08/16/15 (!) 336 lb (152.4 kg)    Current diet: well balanced        Allergies  Allergen Reactions  . Metoclopramide Hcl Shortness Of Breath and Anxiety     Current Outpatient Prescriptions:  .  ALPRAZolam (XANAX) 1 MG tablet, Take 1/2 to 1 two times a day as needed., Disp: 20 tablet, Rfl: 0 .  atorvastatin (LIPITOR) 20 MG tablet, Take 1 tablet (20 mg total) by mouth daily., Disp: 90 tablet, Rfl: 3 .  cyanocobalamin 100 MCG tablet, Take 2 tablets by mouth 2 (two) times daily., Disp: , Rfl:  .  gabapentin (NEURONTIN) 100 MG capsule, Take 1 capsule (100 mg total) by mouth 3 (three) times daily. (Patient taking differently: Take 100 mg by mouth daily. ), Disp: 180 capsule, Rfl: 0 .  Insulin Glargine (BASAGLAR KWIKPEN) 100 UNIT/ML  SOPN, INJECT 60 UNITS INTO THE SKIN DAILY AT 10 PM., Disp: , Rfl: 3  (NOT TAKING) .  Insulin Pen Needle (BD PEN NEEDLE NANO U/F) 32G X 4 MM MISC, Use twice daily, Disp: 200 each, Rfl: 4 .  Liraglutide (VICTOZA) 18 MG/3ML SOPN, Inject 0.2 mLs (1.2 mg total) into the skin every evening., Disp: 18 mL, Rfl: 3 .  lisinopril-hydrochlorothiazide (PRINZIDE,ZESTORETIC) 20-12.5 MG tablet, TAKE 1 TABLET BY MOUTH AT BEDTIME., Disp: 90 tablet, Rfl: 3 .  metFORMIN (GLUCOPHAGE) 500 MG tablet, TAKE 1 TABLET (500 MG TOTAL) BY MOUTH 2 (TWO) TIMES DAILY WITH A MEAL., Disp: 180 tablet, Rfl: 4 .  metoprolol succinate (TOPROL-XL) 50 MG 24 hr tablet, Take 1  tablet (50 mg total) by mouth daily. Take with or immediately following a meal., Disp: 90 tablet, Rfl: 3 .  Multiple Vitamins-Minerals (MULTIVITAMIN PO), Take by mouth daily., Disp: , Rfl:  .  Naftifine HCl 2 % CREA, Apply 1 application topically daily., Disp: 60 g, Rfl: 2 .  ONE TOUCH ULTRA TEST test strip, USE TO TEST BLOOD SUGAR TWICE A DAY, Disp: 450 each, Rfl: 3 .  pantoprazole (PROTONIX) 40 MG tablet, TAKE 1 TABLET (40 MG TOTAL) BY MOUTH DAILY., Disp: 90 tablet, Rfl: 1 .  spironolactone (ALDACTONE) 50 MG tablet, Take 50 mg by mouth daily., Disp: , Rfl:   Review of Systems  Constitutional: Negative.   Respiratory: Negative.   Cardiovascular: Negative for chest pain, palpitations and leg swelling.  Endocrine: Negative for cold intolerance, heat intolerance, polydipsia, polyphagia and polyuria.  Musculoskeletal: Negative.   Psychiatric/Behavioral: Negative.     Social History  Substance Use Topics  . Smoking status: Never Smoker  . Smokeless tobacco: Never Used  . Alcohol use No   Objective:   BP (!) 142/78 (BP Location: Right Arm, Patient Position: Sitting, Cuff Size: Large)   Pulse 80   Temp 98.4 F (36.9 C)   Resp 16   Ht 6\' 4"  (1.93 m)   Wt (!) 342 lb (155.1 kg)   BMI 41.63 kg/m   Physical Exam  General Appearance:    Alert, cooperative, no distress, obese  Eyes:    PERRL, conjunctiva/corneas clear, EOM's intact       Lungs:     Clear to auscultation bilaterally, respirations unlabored  Heart:    Regular rate and rhythm  Neurologic:   Awake, alert, oriented x 3. No apparent focal neurological           defect.        Results for orders placed or performed in visit on 11/22/15  POCT glycosylated hemoglobin (Hb A1C)  Result Value Ref Range   Hemoglobin A1C 8.6    Est. average glucose Bld gHb Est-mCnc 200        Assessment & Plan:     1. Essential hypertension Well controlled.  Continue current medications.    2. Diabetes mellitus due to underlying  condition with diabetic neuropathy, unspecified long term insulin use status ([place]) Counseled patient that different insulins are not interchangable and to never take an insulin that he not prescribed. He is going back on basaglar as previously prescribed. Increase Victoza to 1.8mg  daily and follow up 3 months.  - POCT glycosylated hemoglobin (Hb A1C)  3. PVC (premature ventricular contraction) Currently asymptomatic.   4. Need for influenza vaccination  - Flu Vaccine QUAD 36+ mos PF IM (Fluarix & Fluzone Quad PF)       Lelon Huh, MD  [facility]  [facility]  

## 2015-11-27 ENCOUNTER — Other Ambulatory Visit: Payer: Self-pay | Admitting: Family Medicine

## 2016-01-22 ENCOUNTER — Other Ambulatory Visit: Payer: Self-pay | Admitting: Podiatry

## 2016-01-22 DIAGNOSIS — I10 Essential (primary) hypertension: Secondary | ICD-10-CM

## 2016-01-25 NOTE — Telephone Encounter (Signed)
Pt needs an appt prior to future refills. 

## 2016-02-28 ENCOUNTER — Encounter: Payer: Self-pay | Admitting: Family Medicine

## 2016-02-28 ENCOUNTER — Ambulatory Visit (INDEPENDENT_AMBULATORY_CARE_PROVIDER_SITE_OTHER): Payer: PRIVATE HEALTH INSURANCE | Admitting: Family Medicine

## 2016-02-28 VITALS — BP 140/88 | HR 72 | Temp 97.6°F | Resp 20 | Wt 344.0 lb

## 2016-02-28 DIAGNOSIS — G4719 Other hypersomnia: Secondary | ICD-10-CM

## 2016-02-28 DIAGNOSIS — I10 Essential (primary) hypertension: Secondary | ICD-10-CM | POA: Diagnosis not present

## 2016-02-28 DIAGNOSIS — G4730 Sleep apnea, unspecified: Secondary | ICD-10-CM

## 2016-02-28 DIAGNOSIS — E669 Obesity, unspecified: Secondary | ICD-10-CM

## 2016-02-28 DIAGNOSIS — IMO0001 Reserved for inherently not codable concepts without codable children: Secondary | ICD-10-CM

## 2016-02-28 DIAGNOSIS — Z6841 Body Mass Index (BMI) 40.0 and over, adult: Secondary | ICD-10-CM

## 2016-02-28 DIAGNOSIS — E0840 Diabetes mellitus due to underlying condition with diabetic neuropathy, unspecified: Secondary | ICD-10-CM | POA: Diagnosis not present

## 2016-02-28 LAB — POCT GLYCOSYLATED HEMOGLOBIN (HGB A1C)
Est. average glucose Bld gHb Est-mCnc: 220
HEMOGLOBIN A1C: 9.3

## 2016-02-28 LAB — POCT UA - MICROALBUMIN: MICROALBUMIN (UR) POC: 50 mg/L

## 2016-02-28 MED ORDER — METFORMIN HCL 500 MG PO TABS: 1000.0000 mg | ORAL_TABLET | Freq: Two times a day (BID) | ORAL | 1 refills | Status: DC

## 2016-02-28 MED ORDER — MODAFINIL 200 MG PO TABS: 200.0000 mg | ORAL_TABLET | Freq: Every day | ORAL | 0 refills | Status: DC

## 2016-02-28 NOTE — Progress Notes (Signed)
Patient: Howard Young Male    DOB: [birth year]   56 y.o.   MRN: [id number] Visit Date: 02/28/2016  Today's Provider: Lelon Huh, MD   Chief Complaint  Patient presents with  . Diabetes  . Hypertension  . PVC   Subjective:    HPI      Diabetes Mellitus Type II, Follow-up:   Lab Results  Component Value Date   HGBA1C 8.6 11/22/2015   HGBA1C 9.3 08/16/2015   HGBA1C 10.0 03/22/2015    Last seen for diabetes 3 months ago.  Management since then includes increasing Victoza to 1.8 mg daily. He reports good compliance with treatment. He is not having side effects.  Current symptoms include paresthesia of the feet and have been stable. (Stable on gabapentin.) Home blood sugar records: improving per pt  Episodes of hypoglycemia? no   Current Insulin Regimen: Basaglar 70 units daily Most Recent Eye Exam: pt due Weight trend: increasing steadily Prior visit with dietician: yes. Never saw Rachel. Current diet: in general, a "healthy" diet  Pt reports he is trying a low carb diet. Would like to try a paleo diet. Current exercise: 1/2- 1 hour 2 or 3 times weekly. Cardio with some weight training.  Pertinent Labs:    Component Value Date/Time   CHOL 187 06/08/2015 1358   CHOL 196 06/12/2014 1204   TRIG 137 06/08/2015 1358   TRIG 157 (H) 06/12/2014 1204   HDL 41 06/08/2015 1358   HDL 41 06/12/2014 1204   LDLCALC 119 (H) 06/08/2015 1358   LDLCALC 124 (H) 06/12/2014 1204   CREATININE 1.24 06/08/2015 1358   CREATININE 1.38 (H) 06/12/2014 1204    Wt Readings from Last 3 Encounters:  02/28/16 (!) 344 lb (156 kg)  11/22/15 (!) 342 lb (155.1 kg)  09/21/15 (!) 333 lb 4 oz (151.2 kg)    ------------------------------------------------------------------------  Hypertension, follow-up:  BP Readings from Last 3 Encounters:  02/28/16 140/88  11/22/15 (!) 142/78  09/21/15 (!) 140/98    He was last seen for hypertension 3 months ago.  BP at that  visit was 142/78. Management since that visit includes no change. He reports good compliance with treatment. He is not having side effects.  He is exercising. He is adherent to low salt diet.   Outside blood pressures are not being checked. He is experiencing fatigue.  Patient denies chest pain, chest pressure/discomfort, dyspnea, exertional chest pressure/discomfort, near-syncope, orthopnea, palpitations and syncope.   Cardiovascular risk factors include advanced age (older than 11 for men, 56 for women), diabetes mellitus, dyslipidemia, hypertension, male gender and obesity (BMI >= 30 kg/m2).      Weight trend: increasing steadily Wt Readings from Last 3 Encounters:  02/28/16 (!) 344 lb (156 kg)  11/22/15 (!) 342 lb (155.1 kg)  09/21/15 (!) 333 lb 4 oz (151.2 kg)    Current diet: in general, a "healthy" diet    ------------------------------------------------------------------------  Follow up for Premature Ventricular Contraction  The patient was last seen for this 3 months ago. Changes made at last visit include none. Pt was asymptomatic at [place]. Pt reports he is stable on Metoprolol.  He reports excellent compliance with treatment.  He feels that condition is Unchanged. He is not having side effects.  ------------------------------------------------------------------------------------  He also complains of feeling very sleepy on his drive home from work since starting back on third shift. He does have history of OSA ans states he uses his CPAP every day and feels  it is working well. He had to take Provigil in the past for EDS related to shift work and state it worked very well.   Allergies  Allergen Reactions  . Metoclopramide Hcl Shortness Of Breath and Anxiety     Current Outpatient Prescriptions:  .  atorvastatin (LIPITOR) 20 MG tablet, Take 1 tablet (20 mg total) by mouth daily., Disp: 90 tablet, Rfl: 3 .  gabapentin (NEURONTIN) 100 MG capsule, Take 1 capsule (100  mg total) by mouth 3 (three) times daily. (Patient taking differently: Take 100 mg by mouth daily. ), Disp: 180 capsule, Rfl: 0 .  Ginkgo Biloba 40 MG TABS, Take by mouth., Disp: , Rfl:  .  glucosamine-chondroitin 500-400 MG tablet, Take 1 tablet by mouth 3 (three) times daily., Disp: , Rfl:  .  Insulin Glargine (BASAGLAR KWIKPEN) 100 UNIT/ML SOPN, INJECT 60 UNITS INTO THE SKIN DAILY AT 10 PM., Disp: , Rfl: 3 .  Insulin Pen Needle (BD PEN NEEDLE NANO U/F) 32G X 4 MM MISC, Use twice daily, Disp: 200 each, Rfl: 4 .  Liraglutide (VICTOZA) 18 MG/3ML SOPN, Inject 0.3 mLs (1.8 mg total) into the skin every evening., Disp: 18 mL, Rfl: 4 .  lisinopril-hydrochlorothiazide (PRINZIDE,ZESTORETIC) 20-12.5 MG tablet, TAKE 1 TABLET BY MOUTH AT BEDTIME., Disp: 90 tablet, Rfl: 3 .  metFORMIN (GLUCOPHAGE) 500 MG tablet, TAKE 1 TABLET (500 MG TOTAL) BY MOUTH 2 (TWO) TIMES DAILY WITH A MEAL., Disp: 180 tablet, Rfl: 1 .  metoprolol succinate (TOPROL-XL) 25 MG 24 hr tablet, , Disp: , Rfl:  .  Multiple Vitamins-Minerals (MULTIVITAMIN PO), Take by mouth daily., Disp: , Rfl:  .  ONE TOUCH ULTRA TEST test strip, USE TO TEST BLOOD SUGAR TWICE A DAY, Disp: 450 each, Rfl: 3 .  pantoprazole (PROTONIX) 40 MG tablet, TAKE 1 TABLET (40 MG TOTAL) BY MOUTH DAILY., Disp: 90 tablet, Rfl: 1 .  spironolactone (ALDACTONE) 50 MG tablet, Take 50 mg by mouth daily., Disp: , Rfl:  .  ST JOHNS WORT EXTRACT PO, Take by mouth., Disp: , Rfl:  .  ALPRAZolam (XANAX) 1 MG tablet, Take 1/2 to 1 two times a day as needed. (Patient not taking: Reported on 02/28/2016), Disp: 20 tablet, Rfl: 0 .  cyanocobalamin 100 MCG tablet, Take 2 tablets by mouth 2 (two) times daily., Disp: , Rfl:   Review of Systems  Constitutional: Positive for fatigue. Negative for activity change, appetite change, chills, diaphoresis, fever and unexpected weight change.  Respiratory: Negative for shortness of breath.   Cardiovascular: Negative for chest pain and palpitations.    Endocrine: Negative for polydipsia, polyphagia and polyuria.    Social History  Substance Use Topics  . Smoking status: Never Smoker  . Smokeless tobacco: Never Used  . Alcohol use No   Objective:   BP 140/88 (BP Location: Right Arm, Patient Position: Sitting, Cuff Size: Large)   Pulse 72   Temp 97.6 F (36.4 C) (Oral)   Resp 20   Wt (!) 344 lb (156 kg)   BMI 41.87 kg/m   Physical Exam  General Appearance:    Alert, cooperative, no distress, obese  Eyes:    PERRL, conjunctiva/corneas clear, EOM's intact       Lungs:     Clear to auscultation bilaterally, respirations unlabored  Heart:    Regular rate and rhythm  Neurologic:   Awake, alert, oriented x 3. No apparent focal neurological           defect.  Results for orders placed or performed in visit on 02/28/16  POCT glycosylated hemoglobin (Hb A1C)  Result Value Ref Range   Hemoglobin A1C 9.3    Est. average glucose Bld gHb Est-mCnc 220   POCT UA - Microalbumin  Result Value Ref Range   Microalbumin Ur, POC 50 mg/L        Assessment & Plan:     1. Diabetes mellitus due to underlying condition with diabetic neuropathy, unspecified long term insulin use status ([place]) Fastngs much better since getting back on consistent basal insulin, but A1c is going back up. He states he did not tolerate glipizide in the past due to severe weight gain. Will double metformin ER to 1000mg  twice a day. Consider adding SGLT2-I. Follow up A1c in 3 months.  - POCT glycosylated hemoglobin (Hb A1C) - POCT UA - Microalbumin  2. Sleep apnea, unspecified type Using CPAP consistently, tolerating and helping with energy .2  3. Excessive daytime sleepiness Likely due to shift work schedule.   4. Essential hypertension Well controlled.  Continue current medications.    5. Class 3 obesity with serious comorbidity and body mass index (BMI) of 40.0 to 44.9 in adult, unspecified obesity type [facility]) He is starting on paleo diet and will try  to increase exercise.   Return in about 3 months (around 05/28/2016).      Patient seen and examined by Lelon Huh, MD, and note scribed by Renaldo Fiddler, CMA.  Lelon Huh, MD  [facility]

## 2016-04-24 ENCOUNTER — Telehealth: Payer: Self-pay | Admitting: Family Medicine

## 2016-04-24 MED ORDER — BASAGLAR KWIKPEN 100 UNIT/ML ~~LOC~~ SOPN: PEN_INJECTOR | SUBCUTANEOUS | 3 refills | Status: DC

## 2016-04-24 NOTE — Telephone Encounter (Signed)
Refill basaglar

## 2016-05-17 ENCOUNTER — Other Ambulatory Visit: Payer: Self-pay | Admitting: Family Medicine

## 2016-05-17 NOTE — Telephone Encounter (Signed)
[facility] for the following medication stating the patients insurance requires a 90-day supply.  Thanks CC    Insulin Glargine (BASAGLAR KWIKPEN) 100 UNIT/ML SOPN  Inject 60 units into the skin daily at 10 PM.

## 2016-05-17 NOTE — Telephone Encounter (Signed)
This medication was last filled 04/24/2016 by Dr. Caryn Section with a qty of 10 pens and 3 refills. Is it ok to authorize 90 day supply as stated below?

## 2016-05-18 NOTE — Telephone Encounter (Signed)
Yes, okay to authorize 90 day supply of [employer] for insurance purposes.

## 2016-05-18 NOTE — Telephone Encounter (Signed)
Pharmacist advised

## 2016-06-27 ENCOUNTER — Encounter: Payer: Self-pay | Admitting: Family Medicine

## 2016-06-27 ENCOUNTER — Ambulatory Visit (INDEPENDENT_AMBULATORY_CARE_PROVIDER_SITE_OTHER): Payer: PRIVATE HEALTH INSURANCE | Admitting: Family Medicine

## 2016-06-27 VITALS — BP 180/100 | HR 81 | Temp 98.5°F | Resp 16 | Ht 76.0 in | Wt 341.0 lb

## 2016-06-27 DIAGNOSIS — I10 Essential (primary) hypertension: Secondary | ICD-10-CM

## 2016-06-27 DIAGNOSIS — E0840 Diabetes mellitus due to underlying condition with diabetic neuropathy, unspecified: Secondary | ICD-10-CM | POA: Diagnosis not present

## 2016-06-27 LAB — POCT GLYCOSYLATED HEMOGLOBIN (HGB A1C)
Est. average glucose Bld gHb Est-mCnc: 243
HEMOGLOBIN A1C: 10.1

## 2016-06-27 NOTE — Progress Notes (Signed)
Patient: Howard Young Male    DOB: 03-26-1960   56 y.o.   MRN: [id number] Visit Date: 06/27/2016  Today's Provider: Lelon Huh, MD   Chief Complaint  Patient presents with  . Follow-up  . Diabetes  . Hypertension   Subjective:    HPI   Diabetes Mellitus Type II, Follow-up:   Lab Results  Component Value Date   HGBA1C 10.1 06/27/2016   HGBA1C 9.3 02/28/2016   HGBA1C 8.6 11/22/2015   Last seen for diabetes 4 months ago.  Management since then includes; doubled metformin ER to 1000 mg bid.  He reports good compliance with treatment. He is not having side effects. none Current symptoms include none and have been unchanged. Home blood sugar records: fasting range: 120  Episodes of hypoglycemia? no   Current Insulin Regimen: 70 units of Basaglar Most Recent Eye Exam: due/2 years ago Weight trend: stable Prior visit with dietician: no Current diet: in general, an "unhealthy" diet Current exercise: some exercise  ----------------------------------------------------------------   Hypertension, follow-up:  BP Readings from Last 3 Encounters:  06/27/16 (!) 180/100  02/28/16 140/88  11/22/15 (!) 142/78    He was last seen for hypertension 4 months ago.  BP at that visit was 140/88. Management since that visit includes; no changes.He reports good compliance with treatment. He is not having side effects. none He some exercising. He is not adherent to low salt diet.   Outside blood pressures are not checking. He is experiencing none.  Patient denies none.   Cardiovascular risk factors include diabetes mellitus.  Use of agents associated with hypertension: none.   ----------------------------------------------------------------  Sleep apnea, unspecified type From 02/28/2016-no changes.  Wt Readings from Last 3 Encounters:  06/27/16 (!) 341 lb (154.7 kg)  02/28/16 (!) 344 lb (156 kg)  11/22/15 (!) 342 lb (155.1 kg)     Class 3 obesity with  serious comorbidity and body mass index (BMI) of 40.0 to 44.9 in adult, unspecified obesity type ([place]) From 02/28/2016-He is starting on paleo diet and will try to increase exercise.    Allergies  Allergen Reactions  . Metoclopramide Hcl Shortness Of Breath and Anxiety  . Glipizide     Profound weigh gain     Current Outpatient Prescriptions:  .  ALPRAZolam (XANAX) 1 MG tablet, Take 1/2 to 1 two times a day as needed., Disp: 20 tablet, Rfl: 0 .  aspirin 81 MG tablet, Take 81 mg by mouth daily., Disp: , Rfl:  .  atorvastatin (LIPITOR) 20 MG tablet, Take 1 tablet (20 mg total) by mouth daily., Disp: 90 tablet, Rfl: 3 .  gabapentin (NEURONTIN) 100 MG capsule, Take 1 capsule (100 mg total) by mouth 3 (three) times daily. (Patient taking differently: Take 100 mg by mouth daily. ), Disp: 180 capsule, Rfl: 0 .  Ginkgo Biloba 40 MG TABS, Take by mouth., Disp: , Rfl:  .  glucosamine-chondroitin 500-400 MG tablet, Take 1 tablet by mouth 3 (three) times daily., Disp: , Rfl:  .  Insulin Glargine (BASAGLAR KWIKPEN) 100 UNIT/ML SOPN, INJECT 60 UNITS INTO THE SKIN DAILY AT 10 PM., Disp: 10 pen, Rfl: 3 .  Insulin Pen Needle (BD PEN NEEDLE NANO U/F) 32G X 4 MM MISC, Use twice daily, Disp: 200 each, Rfl: 4 .  Liraglutide (VICTOZA) 18 MG/3ML SOPN, Inject 0.3 mLs (1.8 mg total) into the skin every evening., Disp: 18 mL, Rfl: 4 .  lisinopril-hydrochlorothiazide (PRINZIDE,ZESTORETIC) 20-12.5 MG tablet, TAKE 1 TABLET BY  MOUTH AT BEDTIME., Disp: 90 tablet, Rfl: 3 .  metFORMIN (GLUCOPHAGE) 500 MG tablet, Take 2 tablets (1,000 mg total) by mouth 2 (two) times daily with a meal., Disp: 360 tablet, Rfl: 1 .  metoprolol succinate (TOPROL-XL) 25 MG 24 hr tablet, , Disp: , Rfl:  .  modafinil (PROVIGIL) 200 MG tablet, Take 1 tablet (200 mg total) by mouth daily., Disp: 90 tablet, Rfl: 0 .  Multiple Vitamins-Minerals (MULTIVITAMIN PO), Take by mouth daily., Disp: , Rfl:  .  ONE TOUCH ULTRA TEST test strip, USE TO TEST  BLOOD SUGAR TWICE A DAY, Disp: 450 each, Rfl: 3 .  pantoprazole (PROTONIX) 40 MG tablet, TAKE 1 TABLET (40 MG TOTAL) BY MOUTH DAILY., Disp: 90 tablet, Rfl: 1 .  spironolactone (ALDACTONE) 50 MG tablet, Take 50 mg by mouth daily., Disp: , Rfl:  .  ST JOHNS WORT EXTRACT PO, Take by mouth., Disp: , Rfl:   Review of Systems  Constitutional: Negative for appetite change, chills and fever.  Respiratory: Negative for chest tightness, shortness of breath and wheezing.   Cardiovascular: Negative for chest pain and palpitations.  Gastrointestinal: Negative for abdominal pain, nausea and vomiting.    Social History  Substance Use Topics  . Smoking status: Never Smoker  . Smokeless tobacco: Never Used  . Alcohol use No   Objective:   BP (!) 180/100 (BP Location: Right Arm, Patient Position: Sitting, Cuff Size: Large)   Pulse 81   Temp 98.5 F (36.9 C) (Oral)   Resp 16   Ht 6\' 4"  (1.93 m)   Wt (!) 341 lb (154.7 kg)   SpO2 98%   BMI 41.51 kg/m  Vitals:   06/27/16 0839  BP: (!) 180/100  Pulse: 81  Resp: 16  Temp: 98.5 F (36.9 C)  TempSrc: Oral  SpO2: 98%  Weight: (!) 341 lb (154.7 kg)  Height: 6\' 4"  (1.93 m)   Depression screen [facility] 2/9 06/27/2016 06/07/2015  Decreased Interest 0 0  Down, Depressed, Hopeless 0 0  PHQ - 2 Score 0 0  Altered sleeping 0 -  Tired, decreased energy 0 -  Change in appetite 0 -  Feeling bad or failure about yourself  0 -  Trouble concentrating 0 -  Moving slowly or fidgety/restless 0 -  Suicidal thoughts 0 -  PHQ-9 Score 0 -  Difficult doing work/chores Not difficult at all -     Physical Exam   General Appearance:    Alert, cooperative, no distress, obese  Eyes:    PERRL, conjunctiva/corneas clear, EOM's intact       Lungs:     Clear to auscultation bilaterally, respirations unlabored  Heart:    Regular rate and rhythm  Neurologic:   Awake, alert, oriented x 3. No apparent focal neurological           defect.         Results for orders  placed or performed in visit on 06/27/16  POCT glycosylated hemoglobin (Hb A1C)  Result Value Ref Range   Hemoglobin A1C 10.1    Est. average glucose Bld gHb Est-mCnc 243        Assessment & Plan:     1. Diabetes mellitus due to underlying condition with diabetic neuropathy, unspecified whether long term insulin use ([facility]) A1c continues to rise despite patient report fastings in low 100s. Continue current medications.  Refer endocrinology.   - POCT glycosylated hemoglobin (Hb A1C) - Ambulatory referral to Endocrinology - Comprehensive metabolic panel - Lipid  panel  2. Essential hypertension Uncontrolled. Anticipating increasing lisinopril-hctz if kidney functions and electrolytes are normal.        Lelon Huh, MD  [facility]

## 2016-07-20 ENCOUNTER — Other Ambulatory Visit: Payer: Self-pay | Admitting: *Deleted

## 2016-07-20 NOTE — Telephone Encounter (Signed)
Pt contacted office for refill request on the following medications:  Insulin Pen Needle (BD PEN NEEDLE NANO U/F) 32G X 4 MM MISC.  [facility]  BP#[phone]/[phone]

## 2016-07-23 MED ORDER — INSULIN PEN NEEDLE 32G X 4 MM MISC: 0 refills | Status: DC

## 2016-07-25 ENCOUNTER — Other Ambulatory Visit: Payer: Self-pay | Admitting: Family Medicine

## 2016-07-27 NOTE — Telephone Encounter (Signed)
[facility] the following medications:  pantoprazole (PROTONIX) 40 MG tablet.  Take 1 tablet by mouth daily.  90 day supply.  [facility] [address]

## 2016-08-03 ENCOUNTER — Other Ambulatory Visit: Payer: Self-pay | Admitting: Family Medicine

## 2016-08-03 NOTE — Telephone Encounter (Signed)
[facility]. Thanks CC  pantoprazole (PROTONIX) 40 MG tablet  >Take 1 tablet ( 40 MG total ) by mouth daily

## 2016-08-07 ENCOUNTER — Telehealth: Payer: Self-pay | Admitting: Family Medicine

## 2016-08-07 NOTE — Telephone Encounter (Signed)
LMOVM for pt to return call. Per Dr. Caryn Section pt needs to get labs done that have been pending since May. Dr. Caryn Section wants labs done before he approves refills.

## 2016-08-07 NOTE — Telephone Encounter (Signed)
Patient returned call and stated that he will get labs done this week.

## 2016-08-07 NOTE — Telephone Encounter (Signed)
Pt says he he having a big problem with his diabetic supplies.  [facility] uses [facility] [address]  PT call back [phone]  Thanks Con [employer]

## 2016-08-09 ENCOUNTER — Other Ambulatory Visit
Admission: RE | Admit: 2016-08-09 | Discharge: 2016-08-09 | Disposition: A | Discharge status: Home / Self Care | Payer: 59 | Source: Ambulatory Visit | Attending: Family Medicine | Admitting: Family Medicine

## 2016-08-09 DIAGNOSIS — E0840 Diabetes mellitus due to underlying condition with diabetic neuropathy, unspecified: Secondary | ICD-10-CM | POA: Insufficient documentation

## 2016-08-09 LAB — LIPID PANEL
Cholesterol: 136 mg/dL (ref 0–200)
HDL: 39 mg/dL — ABNORMAL LOW (ref >40)
LDL CALC: 75 mg/dL (ref 0–99)
TRIGLYCERIDES: 108 mg/dL (ref <150)
Total CHOL/HDL Ratio: 3.5 RATIO
VLDL: 22 mg/dL (ref 0–40)

## 2016-08-09 LAB — COMPREHENSIVE METABOLIC PANEL
ALK PHOS: 48 U/L (ref 38–126)
ALT: 62 U/L (ref 17–63)
AST: 64 U/L — AB (ref 15–41)
Albumin: 4 g/dL (ref 3.5–5.0)
Anion gap: 6 (ref 5–15)
BUN: 17 mg/dL (ref 6–20)
CALCIUM: 9.4 mg/dL (ref 8.9–10.3)
CO2: 27 mmol/L (ref 22–32)
CREATININE: 1.16 mg/dL (ref 0.61–1.24)
Chloride: 101 mmol/L (ref 101–111)
GFR calc non Af Amer: >60 mL/min (ref >60)
GLUCOSE: 154 mg/dL — AB (ref 65–99)
Potassium: 4.1 mmol/L (ref 3.5–5.1)
SODIUM: 134 mmol/L — AB (ref 135–145)
Total Bilirubin: 1.6 mg/dL — ABNORMAL HIGH (ref 0.3–1.2)
Total Protein: 7.9 g/dL (ref 6.5–8.1)

## 2016-08-10 ENCOUNTER — Other Ambulatory Visit: Payer: Self-pay

## 2016-08-10 DIAGNOSIS — I10 Essential (primary) hypertension: Secondary | ICD-10-CM

## 2016-08-10 MED ORDER — MODAFINIL 200 MG PO TABS: 200.0000 mg | ORAL_TABLET | Freq: Every day | ORAL | 3 refills | Status: DC

## 2016-08-10 MED ORDER — LISINOPRIL-HYDROCHLOROTHIAZIDE 20-12.5 MG PO TABS: 2.0000 | ORAL_TABLET | Freq: Every day | ORAL | 3 refills | Status: DC

## 2016-08-10 MED ORDER — METOPROLOL SUCCINATE ER 50 MG PO TB24: 50.0000 mg | ORAL_TABLET | Freq: Every day | ORAL | 3 refills | Status: DC

## 2016-08-10 MED ORDER — PANTOPRAZOLE SODIUM 40 MG PO TBEC: 40.0000 mg | DELAYED_RELEASE_TABLET | Freq: Every day | ORAL | 3 refills | Status: DC

## 2016-08-10 MED ORDER — LIRAGLUTIDE 18 MG/3ML ~~LOC~~ SOPN: 1.8000 mg | PEN_INJECTOR | Freq: Every evening | SUBCUTANEOUS | 3 refills | Status: DC

## 2016-08-10 MED ORDER — BASAGLAR KWIKPEN 100 UNIT/ML ~~LOC~~ SOPN: PEN_INJECTOR | SUBCUTANEOUS | 3 refills | Status: DC

## 2016-08-10 MED ORDER — SPIRONOLACTONE 50 MG PO TABS: 50.0000 mg | ORAL_TABLET | Freq: Every day | ORAL | 3 refills | Status: DC

## 2016-08-10 MED ORDER — INSULIN PEN NEEDLE 29G X 5MM MISC: 2.0000 | Freq: Every day | 3 refills | Status: DC

## 2016-08-10 MED ORDER — ATORVASTATIN CALCIUM 20 MG PO TABS: 20.0000 mg | ORAL_TABLET | Freq: Every day | ORAL | 3 refills | Status: DC

## 2016-08-10 MED ORDER — METFORMIN HCL 500 MG PO TABS: 1000.0000 mg | ORAL_TABLET | Freq: Two times a day (BID) | ORAL | 3 refills | Status: DC

## 2016-08-10 NOTE — Progress Notes (Signed)
Advised  ED 

## 2016-09-04 ENCOUNTER — Ambulatory Visit (INDEPENDENT_AMBULATORY_CARE_PROVIDER_SITE_OTHER): Payer: PRIVATE HEALTH INSURANCE | Admitting: Podiatry

## 2016-09-04 ENCOUNTER — Ambulatory Visit (INDEPENDENT_AMBULATORY_CARE_PROVIDER_SITE_OTHER): Payer: PRIVATE HEALTH INSURANCE

## 2016-09-04 ENCOUNTER — Encounter: Payer: Self-pay | Admitting: Podiatry

## 2016-09-04 VITALS — BP 118/72 | HR 83 | Resp 18

## 2016-09-04 DIAGNOSIS — E1142 Type 2 diabetes mellitus with diabetic polyneuropathy: Secondary | ICD-10-CM | POA: Diagnosis not present

## 2016-09-04 DIAGNOSIS — B353 Tinea pedis: Secondary | ICD-10-CM | POA: Diagnosis not present

## 2016-09-04 DIAGNOSIS — M778 Other enthesopathies, not elsewhere classified: Secondary | ICD-10-CM

## 2016-09-04 DIAGNOSIS — L03032 Cellulitis of left toe: Secondary | ICD-10-CM | POA: Diagnosis not present

## 2016-09-04 DIAGNOSIS — M7752 Other enthesopathy of left foot: Secondary | ICD-10-CM | POA: Diagnosis not present

## 2016-09-04 DIAGNOSIS — M779 Enthesopathy, unspecified: Principal | ICD-10-CM

## 2016-09-04 MED ORDER — NAFTIFINE HCL 1 % EX CREA: TOPICAL_CREAM | Freq: Every day | CUTANEOUS | 3 refills | Status: DC

## 2016-09-04 NOTE — Progress Notes (Signed)
   Subjective:    Patient ID: Howard Young, male    DOB: [birth year], 58 y.o.   MRN: [id number]  HPI he presents today with a chief complaint of a painful hallux nail left and the second toe left. He states that he's had some dry itchy skin overlying the second toe and irritation around the proximal medial border of the left hallux. He states that he has noticed a rash and he started using Tinactin spray which has alleviated symptoms to some degree. He states that he is orally controlled diabetic with a hemoglobin A1c of 10.0. He also goes on to say that he has some numbness in his feet.    Review of Systems  All other systems reviewed and are negative.      Objective:   Physical Exam: Vital signs are stable he is alert and oriented 3 have reviewed his past history medications out of surgery social history and review of systems. No apparent distress.  Pulses are strongly palpable. Neurologic sensorium is seemingly intact her Semmes-Weinstein monofilament. Deep tendon reflexes are intact muscle strength is normal bilateral. Orthopedic evaluation shows all joints to cycle for range of motion crepitation. Cutaneous evaluation demonstrates supple well-hydrated cues dorsum of the foot dresser out of simple aspect of the foot what appears to be tinea pedis to the proximal nail fold around the medial aspect of the nail and on the second toe and on the tuft of the toe. The second toes mildly swollen due to a fracture which has gone heel. He also has a nail plate hallux left. His long poorly kept subungual debris malodor maceration and probable bacterial colonization. This has resulted in some skin breakdown around the medial aspect of the toe.  Radiographic evaluation demonstrates normal osseous architecture. It also demonstrates old fractures to the tuft of the hallux as well as the proximal phalanx of the second digit and fifth digit.        Assessment & Plan:  Assessment diabetes mellitus  with tinea pedis.  Plan: Trim the nail. Recommend soaking every other day for about 20 minutes Epsom salts water I also started him on Naftin cream. Follow up with him in a couple weeks for 1 month.

## 2016-10-02 ENCOUNTER — Encounter (INDEPENDENT_AMBULATORY_CARE_PROVIDER_SITE_OTHER): Payer: PRIVATE HEALTH INSURANCE | Admitting: Podiatry

## 2016-10-02 NOTE — Progress Notes (Signed)
This encounter was created in error - please disregard.

## 2016-10-09 ENCOUNTER — Encounter: Payer: Self-pay | Admitting: Medical Oncology

## 2016-10-09 ENCOUNTER — Emergency Department: Payer: 59

## 2016-10-09 ENCOUNTER — Emergency Department
Admission: EM | Admit: 2016-10-09 | Discharge: 2016-10-09 | Disposition: A | Discharge status: Home / Self Care | Payer: 59 | Attending: Emergency Medicine | Admitting: Emergency Medicine

## 2016-10-09 DIAGNOSIS — Z7982 Long term (current) use of aspirin: Secondary | ICD-10-CM | POA: Diagnosis not present

## 2016-10-09 DIAGNOSIS — Z79899 Other long term (current) drug therapy: Secondary | ICD-10-CM | POA: Diagnosis not present

## 2016-10-09 DIAGNOSIS — E1165 Type 2 diabetes mellitus with hyperglycemia: Secondary | ICD-10-CM | POA: Diagnosis not present

## 2016-10-09 DIAGNOSIS — R002 Palpitations: Secondary | ICD-10-CM | POA: Diagnosis not present

## 2016-10-09 DIAGNOSIS — R55 Syncope and collapse: Secondary | ICD-10-CM | POA: Diagnosis not present

## 2016-10-09 DIAGNOSIS — I10 Essential (primary) hypertension: Secondary | ICD-10-CM | POA: Diagnosis not present

## 2016-10-09 DIAGNOSIS — R0602 Shortness of breath: Secondary | ICD-10-CM | POA: Diagnosis present

## 2016-10-09 DIAGNOSIS — Z794 Long term (current) use of insulin: Secondary | ICD-10-CM | POA: Insufficient documentation

## 2016-10-09 DIAGNOSIS — R739 Hyperglycemia, unspecified: Secondary | ICD-10-CM

## 2016-10-09 HISTORY — DX: Sleep apnea, unspecified: G47.30

## 2016-10-09 LAB — BASIC METABOLIC PANEL
Anion gap: 10 (ref 5–15)
BUN: 24 mg/dL — AB (ref 6–20)
CHLORIDE: 101 mmol/L (ref 101–111)
CO2: 24 mmol/L (ref 22–32)
CREATININE: 1.61 mg/dL — AB (ref 0.61–1.24)
Calcium: 9.7 mg/dL (ref 8.9–10.3)
GFR calc Af Amer: 53 mL/min — ABNORMAL LOW (ref >60)
GFR calc non Af Amer: 46 mL/min — ABNORMAL LOW (ref >60)
GLUCOSE: 329 mg/dL — AB (ref 65–99)
POTASSIUM: 4.6 mmol/L (ref 3.5–5.1)
SODIUM: 135 mmol/L (ref 135–145)

## 2016-10-09 LAB — CBC
HEMATOCRIT: 44.1 % (ref 40.0–52.0)
Hemoglobin: 15.1 g/dL (ref 13.0–18.0)
MCH: 28.8 pg (ref 26.0–34.0)
MCHC: 34.2 g/dL (ref 32.0–36.0)
MCV: 84.2 fL (ref 80.0–100.0)
Platelets: 234 10*3/uL (ref 150–440)
RBC: 5.24 MIL/uL (ref 4.40–5.90)
RDW: 13 % (ref 11.5–14.5)
WBC: 11.2 10*3/uL — AB (ref 3.8–10.6)

## 2016-10-09 LAB — BLOOD GAS, VENOUS
Acid-Base Excess: 0.2 mmol/L (ref 0.0–2.0)
Bicarbonate: 26.5 mmol/L (ref 20.0–28.0)
PCO2 VEN: 48 mmHg (ref 44.0–60.0)
PH VEN: 7.35 (ref 7.250–7.430)
Patient temperature: 37

## 2016-10-09 LAB — FIBRIN DERIVATIVES D-DIMER (ARMC ONLY): FIBRIN DERIVATIVES D-DIMER (ARMC): 633.2 — AB (ref 0.00–499.00)

## 2016-10-09 LAB — TROPONIN I

## 2016-10-09 LAB — GLUCOSE, CAPILLARY: GLUCOSE-CAPILLARY: 216 mg/dL — AB (ref 65–99)

## 2016-10-09 MED ORDER — SODIUM CHLORIDE 0.9 % IV BOLUS (SEPSIS)
1000.0000 mL | Freq: Once | INTRAVENOUS | Status: AC
Start: 2016-10-09 — End: 2016-10-09
  Administered 2016-10-09: 1000 mL via INTRAVENOUS

## 2016-10-09 MED ORDER — IOPAMIDOL (ISOVUE-370) INJECTION 76%
75.0000 mL | Freq: Once | INTRAVENOUS | Status: AC | PRN
  Administered 2016-10-09: 75 mL via INTRAVENOUS

## 2016-10-09 MED ORDER — SODIUM CHLORIDE 0.9 % IV BOLUS (SEPSIS)
1000.0000 mL | Freq: Once | INTRAVENOUS | Status: AC
  Administered 2016-10-09: 1000 mL via INTRAVENOUS

## 2016-10-09 NOTE — ED Notes (Signed)
Report off to [employer]

## 2016-10-09 NOTE — ED Provider Notes (Signed)
-----------------------------------------   10:30 PM on 10/09/2016 -----------------------------------------   Blood pressure 130/68, pulse 65, temperature 98 F (36.7 C), temperature source Oral, resp. rate 18, height 6\' 4"  (1.93 m), weight (!) 152 kg (335 lb), SpO2 100 %.  Assuming care from Dr. Cherylann Banas of [facility] GABRIELSON is a 56 y.o. male with a chief complaint of Shortness of Breath and Palpitations .    Please refer to H&P by previous MD for further details.  The current plan of care is to f/u results of CTA, repeat BG, and reasses.   CT Angio Chest PE W and/or Wo Contrast (Final result)  Result time 10/09/16 22:17:58  Final result by Delphina Cahill, MD (10/09/16 22:17:58)           Narrative:   CLINICAL DATA: Dyspnea and palpitations with exertion, onset this morning.  EXAM: CT ANGIOGRAPHY CHEST WITH CONTRAST  TECHNIQUE: Multidetector CT imaging of the chest was performed using the standard protocol during bolus administration of intravenous contrast. Multiplanar CT image reconstructions and MIPs were obtained to evaluate the vascular anatomy.  CONTRAST: 75 mL Isovue 370 intravenous  COMPARISON: 02/22/2004, 10/09/2016  FINDINGS: Cardiovascular: Satisfactory opacification of the pulmonary arteries to the segmental level. No evidence of pulmonary embolism. Normal heart size. No pericardial effusion.  Mediastinum/Nodes: No enlarged mediastinal, hilar, or axillary lymph nodes. Thyroid gland, trachea, and esophagus demonstrate no significant findings.  Lungs/Pleura: Lungs are clear. No pleural effusion or pneumothorax.  Upper Abdomen: No acute findings. Lap band noted.  Musculoskeletal: No significant skeletal lesion.  Review of the MIP images confirms the above findings.  IMPRESSION: Negative for acute pulmonary embolism. No significant abnormality.         CTA negative. BG trending down after IVF. Patient tolerating PO. Patient feels markedly  improved with resolution of his symptoms. Recommended f/u with PCP in 3 days and discussed return precautions with patient.       Rudene Re, MD 10/09/16 2233

## 2016-10-09 NOTE — ED Provider Notes (Signed)
[facility] Emergency Department Provider Note ____________________________________________   First MD Initiated Contact with Patient 10/09/16 1818     (approximate)  I have reviewed the triage vital signs and the nursing notes.   HISTORY  Chief Complaint Shortness of Breath and Palpitations    HPI Howard Young is a 56 y.o. male History of diabetes, hypertension, GERD, sleep apnea, who presents with chest pain and acute onset approximately 8 hours ago after patient had exerted himself lifting at work and associated with lightheadedness and fecal to breathing. Patient states the symptoms occur when he stands up, and when he exertshimself. No prior history of this symptom.He reports he gets bilateral leg swelling chronically but no acute change in the swelling and no asymmetrical swelling. No recent cough, fever or chills, GI symptoms, or dysuria, however patient states he has been urinating frequently and his glucose was elevated today.   Past Medical History:  Diagnosis Date  . Diabetes mellitus without complication ([place])   . GERD (gastroesophageal reflux disease)   . Hypertension   . Morbid obesity ([place])   . Sleep apnea     Patient Active Problem List   Diagnosis Date Noted  . PVC (premature ventricular contraction) 08/16/2015  . Anxiety 09/15/2014  . Elevated CK 09/15/2014  . Essential hypertension 09/15/2014  . Hypertriglyceridemia 09/15/2014  . Hypotestosteronism 09/15/2014  . NASH (nonalcoholic steatohepatitis) 09/15/2014  . Adiposity 09/15/2014  . PE (pulmonary embolism) 09/15/2014  . Diabetes mellitus with diabetic neuropathy ([place]) 09/15/2014  . Apnea, sleep 09/15/2014  . Right shoulder pain 08/31/2014  . Male hypogonadism 06/10/2013  . 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 Metabolic syndrome 26/37/8588  . Complication of gastric banding-insufficient weight loss 11/29/2011  . Obesity-BMI 41 03/31/2011  . Status post gastric  banding-APL 2008 03/31/2011  . Pulmonary embolism-2005 03/31/2011  . WPW (Wolff-Parkinson-White syndrome)-cardiac ablation 1993 03/31/2011  . S/P cholecystectomy-2001 03/31/2011    Past Surgical History:  Procedure Laterality Date  . BREATH TEK H PYLORI  04/25/2011   Procedure: BREATH TEK H PYLORI;  Surgeon: Pedro Earls, MD;  Location: [facility];  Service: General;  Laterality: N/A;  . CHOLECYSTECTOMY  2001  . heart ablation  1993  . JOINT REPLACEMENT  2012   right hip  . LAPAROSCOPIC GASTRIC BANDING  2008    Prior to Admission medications   Medication Sig Start Date End Date Taking? Authorizing Provider  aspirin 81 MG tablet Take 81 mg by mouth daily.   Yes [provider]  atorvastatin (LIPITOR) 20 MG tablet Take 1 tablet (20 mg total) by mouth daily. 08/10/16  Yes Birdie Sons, MD  gabapentin (NEURONTIN) 100 MG capsule Take 1 capsule (100 mg total) by mouth 3 (three) times daily. Patient taking differently: Take 100 mg by mouth daily.  09/17/15  Yes Hyatt, Max T, DPM  Ginkgo Biloba 40 MG TABS Take by mouth.   Yes [provider]  glucosamine-chondroitin 500-400 MG tablet Take 1 tablet by mouth 3 (three) times daily.   Yes [provider]  Insulin Glargine (BASAGLAR KWIKPEN) 100 UNIT/ML SOPN INJECT0 UNITS INTO THE SKIN DAILY AT 10 PM. Patient taking differently: Inject 60 Units into the skin daily at 10 pm. INJECT0 UNITS INTO THE SKIN DAILY AT 10 PM. 08/10/16  Yes Birdie Sons, MD  liraglutide (VICTOZA) 18 MG/3ML SOPN Inject 0.3 mLs (1.8 mg total) into the skin every evening. 08/10/16  Yes Birdie Sons, MD  lisinopril-hydrochlorothiazide (PRINZIDE,ZESTORETIC) 20-12.5 MG tablet  Take 2 tablets by mouth at bedtime. 08/10/16  Yes Birdie Sons, MD  metFORMIN (GLUCOPHAGE) 500 MG tablet Take 2 tablets (1,000 mg total) by mouth 2 (two) times daily with a meal. 08/10/16  Yes Birdie Sons, MD  metoprolol succinate (TOPROL-XL) 50 MG 24 hr tablet  Take 1 tablet (50 mg total) by mouth daily. Take with or immediately following a meal. 08/10/16  Yes Birdie Sons, MD  Multiple Vitamins-Minerals (MULTIVITAMIN PO) Take by mouth daily.   Yes [provider]  pantoprazole (PROTONIX) 40 MG tablet Take 1 tablet (40 mg total) by mouth daily. 08/10/16  Yes Birdie Sons, MD  spironolactone (ALDACTONE) 50 MG tablet Take 1 tablet (50 mg total) by mouth daily. 08/10/16  Yes Birdie Sons, MD  ST JOHNS WORT EXTRACT PO Take by mouth.   Yes [provider]  ALPRAZolam Duanne Moron) 1 MG tablet Take 1/2 to 1 two times a day as needed. 09/24/14   Margarita Rana, MD  Insulin Pen Needle (BD PEN NEEDLE NANO U/F) 32G X 4 MM MISC Use twice daily 07/23/16   Birdie Sons, MD  Insulin Pen Needle 29G X 5MM MISC 2 each by Does not apply route daily. 08/10/16   Birdie Sons, MD  modafinil (PROVIGIL) 200 MG tablet Take 1 tablet (200 mg total) by mouth daily. 08/10/16   Birdie Sons, MD  naftifine (NAFTIN) 1 % cream Apply topically daily. 09/04/16   Hyatt, Max T, DPM  ONE TOUCH ULTRA TEST test strip USE TO TEST BLOOD SUGAR TWICE A DAY 10/01/15   Birdie Sons, MD    Allergies Metoclopramide hcl and Glipizide  Family History  Problem Relation Age of Onset  . Cancer Mother        breast, lung  . Cancer Father        colon  . Cancer Sister        breast  . Cancer Sister        ovarian    Social History Social History  Substance Use Topics  . Smoking status: Never Smoker  . Smokeless tobacco: Never Used  . Alcohol use No    Review of Systems  Constitutional: No fever/chills Eyes: No visual changes. ENT: No sore throat. Cardiovascular: positive for chest tightness. Respiratory: positive for shortness of breath. Gastrointestinal: No nausea, no vomiting.  No diarrhea.  Genitourinary: Positive for polyuria. Musculoskeletal: Negative for back pain. Skin: Negative for rash. Neurological: Negative for headaches, focal weakness  or numbness. Positive for lightheadedness.   ____________________________________________   PHYSICAL EXAM:  VITAL SIGNS: ED Triage Vitals  Enc Vitals Group     BP 10/09/16 1600 119/70     Pulse Rate 10/09/16 1600 86     Resp 10/09/16 1600 20     Temp 10/09/16 1600 98 F (36.7 C)     Temp Source 10/09/16 1600 Oral     SpO2 10/09/16 1600 97 %     Weight 10/09/16 1601 (!) 335 lb (152 kg)     Height 10/09/16 1601 6\' 4"  (1.93 m)     Head Circumference --      Peak Flow --      Pain Score 10/09/16 1600 4     Pain Loc --      Pain Edu? --      Excl. in [place]? --     Constitutional: Alert and oriented. Well appearing and in no acute distress. Eyes: Conjunctivae are normal.  Head: Atraumatic.  Nose: No congestion/rhinnorhea. Mouth/Throat: Mucous membranes are moist.   Neck: Normal range of motion.  Cardiovascular: Normal rate, regular rhythm. Grossly normal heart sounds.  Good peripheral circulation. Respiratory: Normal respiratory effort.  No retractions. Lungs CTAB. Gastrointestinal: Soft and nontender. No distention.  Genitourinary: No CVA tenderness. Musculoskeletal: Trace symmetrical bilateral lower extremity edema.  No calf tenderness.  Extremities warm and well perfused.  Neurologic:  Normal speech and language. No gross focal neurologic deficits are appreciated.  Skin:  Skin is warm and dry. No rash noted. Psychiatric: Mood and affect are normal. Speech and behavior are normal.  ____________________________________________   LABS (all labs ordered are listed, but only abnormal results are displayed)  Labs Reviewed  BASIC METABOLIC PANEL - Abnormal; Notable for the following:       Result Value   Glucose, Bld 329 (*)    BUN 24 (*)    Creatinine, Ser 1.61 (*)    GFR calc non Af Amer 46 (*)    GFR calc Af Amer 53 (*)    All other components within normal limits  CBC - Abnormal; Notable for the following:    WBC 11.2 (*)    All other components within normal limits    FIBRIN DERIVATIVES D-DIMER ([facility] ONLY) - Abnormal; Notable for the following:    Fibrin derivatives D-dimer ([facility]) 633.20 (*)    All other components within normal limits  BLOOD GAS, VENOUS - Abnormal; Notable for the following:    pO2, Ven <31.0 (*)    All other components within normal limits  GLUCOSE, CAPILLARY - Abnormal; Notable for the following:    Glucose-Capillary 216 (*)    All other components within normal limits  TROPONIN I  TROPONIN I  CBG MONITORING, ED   ____________________________________________  EKG  ED ECG REPORT I, Arta Silence, the attending physician, personally viewed and interpreted this ECG.  Date: 10/09/2016 EKG Time: 1603 Rate: 82 Rhythm: normal sinus rhythm QRS Axis: normal Intervals: normal ST/T Wave abnormalities: nonspecific septal ST-T wave findings Narrative Interpretation: no evidence of acute ischemia  ____________________________________________  RADIOLOGY    ____________________________________________   PROCEDURES  Procedure(s) performed: No    Critical Care performed: No ____________________________________________   INITIAL IMPRESSION / ASSESSMENT AND PLAN / ED COURSE  Pertinent labs & imaging results that were available during my care of the patient were reviewed by me and considered in my medical decision making (see chart for details).  56 year old male history of diabetes, hypertension,heart, and sleep apnea, presents with chest tightness, shortness of breath, and near syncope with exertion or with standing. Symptoms started acutely today. Patient also noted that his glucose was high earlier and he has had some polyuria. Vital signs are normal, patient is relatively well-appearing, and exam is otherwise unremarkable.EKG with no acute ischemic changes.initial labs are unremarkable and first troponin is negative. Overall suspect most likely symptoms related to diabetes and patient is presyncopal from this, with  lower suspicion of cardiac cause given her unremarkable EKG and negative first troponin more than 6 hours since the onset of symptoms. Patient has no symptoms to suggest focal infection. He has remote history of PE many years ago however no hypoxia, tachycardia, or asymmetrical leg swelling to suggest DVT. He is still overall low risk so will obtain d-dimer. Also obtain repeat troponin, VBG, UA and give fluids.  If symptoms resolve and glucose improves fluids and further workup is negative possible discharge home.      ----------------------------------------- 8:26 PM on 10/09/2016 -----------------------------------------  Lab workup shows no acidosis and otherwise unremarkable findings other than the elevated glucose.  However the d-dimer is elevated. Will proceed with CT angio chest to rule out PE.   ____________________________________________   FINAL CLINICAL IMPRESSION(S) / ED DIAGNOSES  Final diagnoses:  Near syncope  Hyperglycemia      NEW MEDICATIONS STARTED DURING THIS VISIT:  Discharge Medication List as of 10/09/2016 10:29 PM       Note:  This document was prepared using Dragon voice recognition software and may include unintentional dictation errors.    Arta Silence, MD 10/11/16 0005

## 2016-10-09 NOTE — ED Notes (Signed)
Pt ambulated without difficulty

## 2016-10-09 NOTE — ED Notes (Signed)
First bag of fluids complete. 2nd bag started

## 2016-10-09 NOTE — ED Notes (Signed)
Pt reports chest discomfort, dizziness since this morning   No chest pain  States no n/v/d.  Non radiating pain.  nsr on monitor.  Pt alert.  Family with pt.

## 2016-10-09 NOTE — ED Notes (Signed)
Patient transported to CT 

## 2016-10-09 NOTE — ED Triage Notes (Signed)
Pt reports he was doing some lifting this am and began having sob and palpitations pt reports some central chest discomfort. Pt in NAD and talks in complete sentences without difficulty.

## 2016-10-23 ENCOUNTER — Ambulatory Visit (INDEPENDENT_AMBULATORY_CARE_PROVIDER_SITE_OTHER): Payer: PRIVATE HEALTH INSURANCE | Admitting: Family Medicine

## 2016-10-23 ENCOUNTER — Encounter: Payer: Self-pay | Admitting: Family Medicine

## 2016-10-23 VITALS — BP 130/86 | HR 78 | Temp 98.5°F | Resp 18 | Wt 338.0 lb

## 2016-10-23 DIAGNOSIS — R079 Chest pain, unspecified: Secondary | ICD-10-CM

## 2016-10-23 DIAGNOSIS — Z23 Encounter for immunization: Secondary | ICD-10-CM

## 2016-10-23 DIAGNOSIS — R0609 Other forms of dyspnea: Secondary | ICD-10-CM | POA: Diagnosis not present

## 2016-10-23 MED ORDER — NITROGLYCERIN 0.4 MG SL SUBL: 0.4000 mg | SUBLINGUAL_TABLET | SUBLINGUAL | 0 refills | Status: DC | PRN

## 2016-10-23 MED ORDER — ONETOUCH ULTRASOFT LANCETS MISC: 4 refills | Status: DC

## 2016-10-23 NOTE — Progress Notes (Signed)
Patient: Howard Young Male    DOB: Dec 06, 1960   56 y.o.   MRN: [id number] Visit Date: 10/23/2016  Today's Provider: Lelon Huh, MD   Chief Complaint  Patient presents with  . Follow-up   Subjective:    HPI  Follow up ER visit  Patient was seen in ER for chest pains, shortness of breath and palpitations on 10/09/2016. He did have elevated D-dimer but had normal CTA chest He was treated for near syncope and hyperglycemia with glucose of 320 at presentation. Troponin levels were normal.   He states symptoms starting when pushing furniture off of truch at [employer] when he had sudden onset of severe pressure in his chest and feeling very short of breath. Lasted several minutes before chest resolved, but continued to have episodes of shortness of breath with exertion off and on until he presented to ER for evaluation. He has had a few episodes of minor dyspnea since discharge, but none that were nearly as severe as the of ER visit.   He reports this condition is Improved. Patient was last seen for Diabetes 4 months ago and was referred to Endocrinology. Patient has not yet established with an Endocrinologist. Patient states he takes 70 units of Basaglar daily along with 1.8 mg of Victoza. He also takes Metformin. He reports fasting sugars in the 140s to 150s  ------------------------------------------------------------------------------------      Allergies  Allergen Reactions  . Metoclopramide Hcl Shortness Of Breath and Anxiety  . Glipizide     Profound weigh gain     Current Outpatient Prescriptions:  .  ALPRAZolam (XANAX) 1 MG tablet, Take 1/2 to 1 two times a day as needed., Disp: 20 tablet, Rfl: 0 .  aspirin 81 MG tablet, Take 81 mg by mouth daily., Disp: , Rfl:  .  atorvastatin (LIPITOR) 20 MG tablet, Take 1 tablet (20 mg total) by mouth daily., Disp: 90 tablet, Rfl: 3 .  gabapentin (NEURONTIN) 100 MG capsule, Take 1 capsule (100 mg total) by mouth 3 (three)  times daily. (Patient taking differently: Take 100 mg by mouth daily. ), Disp: 180 capsule, Rfl: 0 .  Ginkgo Biloba 40 MG TABS, Take by mouth., Disp: , Rfl:  .  glucosamine-chondroitin 500-400 MG tablet, Take 1 tablet by mouth 3 (three) times daily., Disp: , Rfl:  .  Insulin Glargine (BASAGLAR KWIKPEN) 100 UNIT/ML SOPN, INJECT0 UNITS INTO THE SKIN DAILY AT 10 PM. (Patient taking differently: Inject 60 Units into the skin daily at 10 pm. INJECT0 UNITS INTO THE SKIN DAILY AT 10 PM.), Disp: 22 pen, Rfl: 3 .  Insulin Pen Needle (BD PEN NEEDLE NANO U/F) 32G X 4 MM MISC, Use twice daily, Disp: 60 each, Rfl: 0 .  Insulin Pen Needle 29G X 5MM MISC, 2 each by Does not apply route daily., Disp: 180 each, Rfl: 3 .  liraglutide (VICTOZA) 18 MG/3ML SOPN, Inject 0.3 mLs (1.8 mg total) into the skin every evening., Disp: 10 pen, Rfl: 3 .  lisinopril-hydrochlorothiazide (PRINZIDE,ZESTORETIC) 20-12.5 MG tablet, Take 2 tablets by mouth at bedtime., Disp: 180 tablet, Rfl: 3 .  metFORMIN (GLUCOPHAGE) 500 MG tablet, Take 2 tablets (1,000 mg total) by mouth 2 (two) times daily with a meal., Disp: 360 tablet, Rfl: 3 .  metoprolol succinate (TOPROL-XL) 50 MG 24 hr tablet, Take 1 tablet (50 mg total) by mouth daily. Take with or immediately following a meal., Disp: 90 tablet, Rfl: 3 .  modafinil (PROVIGIL) 200 MG tablet,  Take 1 tablet (200 mg total) by mouth daily., Disp: 90 tablet, Rfl: 3 .  Multiple Vitamins-Minerals (MULTIVITAMIN PO), Take by mouth daily., Disp: , Rfl:  .  naftifine (NAFTIN) 1 % cream, Apply topically daily., Disp: 90 g, Rfl: 3 .  ONE TOUCH ULTRA TEST test strip, USE TO TEST BLOOD SUGAR TWICE A DAY, Disp: 450 each, Rfl: 3 .  pantoprazole (PROTONIX) 40 MG tablet, Take 1 tablet (40 mg total) by mouth daily., Disp: 90 tablet, Rfl: 3 .  spironolactone (ALDACTONE) 50 MG tablet, Take 1 tablet (50 mg total) by mouth daily., Disp: 90 tablet, Rfl: 3 .  ST JOHNS WORT EXTRACT PO, Take by mouth., Disp: , Rfl:    Review of Systems  Constitutional: Negative for appetite change, chills and fever.  Respiratory: Negative for chest tightness, shortness of breath and wheezing.   Cardiovascular: Negative for chest pain and palpitations.  Gastrointestinal: Negative for abdominal pain, nausea and vomiting.    Social History  Substance Use Topics  . Smoking status: Never Smoker  . Smokeless tobacco: Never Used  . Alcohol use No   Objective:   BP 130/86 (BP Location: Left Arm, Patient Position: Sitting, Cuff Size: Large)   Pulse 78   Temp 98.5 F (36.9 C) (Oral)   Resp 18   Wt (!) 338 lb (153.3 kg)   SpO2 97% Comment: room air  BMI 41.14 kg/m  There were no vitals filed for this visit.   Physical Exam  General Appearance:    Alert, cooperative, no distress, obese  Eyes:    PERRL, conjunctiva/corneas clear, EOM's intact       Lungs:     Clear to auscultation bilaterally, respirations unlabored  Heart:    Regular rate and rhythm  Neurologic:   Awake, alert, oriented x 3. No apparent focal neurological           defect.           Assessment & Plan:     1. Dyspnea on exertion Resolved.   2. Chest pain, unspecified type Unclear etiology. Negative chest CTA and troponin. Myoview done  In May 2017. He is due for follow up with cardiology and he states he will call to schedule in the near future. Given prescription for nitroglycerine to take in case any further episodes.  3. Need for influenza vaccination  - Flu Vaccine QUAD 6+ mos PF IM (Fluarix Quad PF)       Lelon Huh, MD  [facility]

## 2016-10-24 ENCOUNTER — Other Ambulatory Visit: Payer: Self-pay | Admitting: Family Medicine

## 2016-11-08 ENCOUNTER — Other Ambulatory Visit: Payer: Self-pay | Admitting: Family Medicine

## 2016-11-09 ENCOUNTER — Ambulatory Visit: Payer: PRIVATE HEALTH INSURANCE | Admitting: Family Medicine

## 2016-11-09 ENCOUNTER — Emergency Department: Payer: 59

## 2016-11-09 ENCOUNTER — Emergency Department: Payer: 59 | Admitting: Anesthesiology

## 2016-11-09 ENCOUNTER — Encounter: Payer: Self-pay | Admitting: Emergency Medicine

## 2016-11-09 ENCOUNTER — Encounter
Admission: EM | Disposition: A | Discharge status: Other Acute Inpatient Hospital | Payer: Self-pay | Source: Home / Self Care | Attending: Internal Medicine

## 2016-11-09 ENCOUNTER — Inpatient Hospital Stay
Admission: EM | Admit: 2016-11-09 | Discharge: 2016-11-10 | DRG: 853 | Disposition: A | Discharge status: Other Acute Inpatient Hospital | Payer: 59 | Attending: Internal Medicine | Admitting: Internal Medicine

## 2016-11-09 DIAGNOSIS — Z86711 Personal history of pulmonary embolism: Secondary | ICD-10-CM | POA: Diagnosis not present

## 2016-11-09 DIAGNOSIS — Z794 Long term (current) use of insulin: Secondary | ICD-10-CM

## 2016-11-09 DIAGNOSIS — I700 Atherosclerosis of aorta: Secondary | ICD-10-CM | POA: Diagnosis present

## 2016-11-09 DIAGNOSIS — Z6841 Body Mass Index (BMI) 40.0 and over, adult: Secondary | ICD-10-CM | POA: Diagnosis not present

## 2016-11-09 DIAGNOSIS — E781 Pure hyperglyceridemia: Secondary | ICD-10-CM | POA: Diagnosis present

## 2016-11-09 DIAGNOSIS — E1140 Type 2 diabetes mellitus with diabetic neuropathy, unspecified: Secondary | ICD-10-CM | POA: Diagnosis present

## 2016-11-09 DIAGNOSIS — N170 Acute kidney failure with tubular necrosis: Secondary | ICD-10-CM | POA: Diagnosis present

## 2016-11-09 DIAGNOSIS — Z888 Allergy status to other drugs, medicaments and biological substances status: Secondary | ICD-10-CM

## 2016-11-09 DIAGNOSIS — K219 Gastro-esophageal reflux disease without esophagitis: Secondary | ICD-10-CM | POA: Diagnosis present

## 2016-11-09 DIAGNOSIS — Z9049 Acquired absence of other specified parts of digestive tract: Secondary | ICD-10-CM | POA: Diagnosis not present

## 2016-11-09 DIAGNOSIS — K7581 Nonalcoholic steatohepatitis (NASH): Secondary | ICD-10-CM | POA: Diagnosis present

## 2016-11-09 DIAGNOSIS — Z7984 Long term (current) use of oral hypoglycemic drugs: Secondary | ICD-10-CM

## 2016-11-09 DIAGNOSIS — N281 Cyst of kidney, acquired: Secondary | ICD-10-CM | POA: Diagnosis present

## 2016-11-09 DIAGNOSIS — E785 Hyperlipidemia, unspecified: Secondary | ICD-10-CM | POA: Diagnosis present

## 2016-11-09 DIAGNOSIS — A419 Sepsis, unspecified organism: Secondary | ICD-10-CM | POA: Diagnosis present

## 2016-11-09 DIAGNOSIS — I10 Essential (primary) hypertension: Secondary | ICD-10-CM | POA: Diagnosis present

## 2016-11-09 DIAGNOSIS — N493 Fournier gangrene: Secondary | ICD-10-CM | POA: Diagnosis present

## 2016-11-09 DIAGNOSIS — Z800 Family history of malignant neoplasm of digestive organs: Secondary | ICD-10-CM

## 2016-11-09 DIAGNOSIS — G4733 Obstructive sleep apnea (adult) (pediatric): Secondary | ICD-10-CM | POA: Diagnosis present

## 2016-11-09 DIAGNOSIS — Z7982 Long term (current) use of aspirin: Secondary | ICD-10-CM | POA: Diagnosis not present

## 2016-11-09 DIAGNOSIS — Z803 Family history of malignant neoplasm of breast: Secondary | ICD-10-CM

## 2016-11-09 DIAGNOSIS — Z96641 Presence of right artificial hip joint: Secondary | ICD-10-CM | POA: Diagnosis present

## 2016-11-09 DIAGNOSIS — R0602 Shortness of breath: Secondary | ICD-10-CM | POA: Diagnosis present

## 2016-11-09 DIAGNOSIS — E1152 Type 2 diabetes mellitus with diabetic peripheral angiopathy with gangrene: Secondary | ICD-10-CM | POA: Diagnosis present

## 2016-11-09 DIAGNOSIS — Z8041 Family history of malignant neoplasm of ovary: Secondary | ICD-10-CM

## 2016-11-09 DIAGNOSIS — F419 Anxiety disorder, unspecified: Secondary | ICD-10-CM | POA: Diagnosis present

## 2016-11-09 HISTORY — PX: SCROTAL EXPLORATION: SHX2386

## 2016-11-09 LAB — COMPREHENSIVE METABOLIC PANEL
ALT: 43 U/L (ref 17–63)
ANION GAP: 11 (ref 5–15)
AST: 44 U/L — ABNORMAL HIGH (ref 15–41)
Albumin: 3.6 g/dL (ref 3.5–5.0)
Alkaline Phosphatase: 69 U/L (ref 38–126)
BUN: 41 mg/dL — ABNORMAL HIGH (ref 6–20)
CALCIUM: 9 mg/dL (ref 8.9–10.3)
CHLORIDE: 96 mmol/L — AB (ref 101–111)
CO2: 25 mmol/L (ref 22–32)
CREATININE: 2.05 mg/dL — AB (ref 0.61–1.24)
GFR, EST AFRICAN AMERICAN: 40 mL/min — AB (ref >60)
GFR, EST NON AFRICAN AMERICAN: 34 mL/min — AB (ref >60)
Glucose, Bld: 299 mg/dL — ABNORMAL HIGH (ref 65–99)
Potassium: 4 mmol/L (ref 3.5–5.1)
Sodium: 132 mmol/L — ABNORMAL LOW (ref 135–145)
Total Bilirubin: 3.6 mg/dL — ABNORMAL HIGH (ref 0.3–1.2)
Total Protein: 8 g/dL (ref 6.5–8.1)

## 2016-11-09 LAB — LACTIC ACID, PLASMA: Lactic Acid, Venous: 1.7 mmol/L (ref 0.5–1.9)

## 2016-11-09 LAB — CBC WITH DIFFERENTIAL/PLATELET
BASOS ABS: 0 10*3/uL (ref 0–0.1)
Basophils Relative: 0 %
Eosinophils Absolute: 0 10*3/uL (ref 0–0.7)
Eosinophils Relative: 0 %
HCT: 41.3 % (ref 40.0–52.0)
Hemoglobin: 14.3 g/dL (ref 13.0–18.0)
LYMPHS ABS: 1.3 10*3/uL (ref 1.0–3.6)
Lymphocytes Relative: 4 %
MCH: 29.1 pg (ref 26.0–34.0)
MCHC: 34.7 g/dL (ref 32.0–36.0)
MCV: 83.8 fL (ref 80.0–100.0)
MONO ABS: 3.2 10*3/uL — AB (ref 0.2–1.0)
MONOS PCT: 10 %
NEUTROS PCT: 86 %
Neutro Abs: 27.9 10*3/uL — ABNORMAL HIGH (ref 1.4–6.5)
PLATELETS: 204 10*3/uL (ref 150–440)
RBC: 4.93 MIL/uL (ref 4.40–5.90)
RDW: 13 % (ref 11.5–14.5)
WBC: 32.4 10*3/uL — AB (ref 3.8–10.6)

## 2016-11-09 LAB — GLUCOSE, CAPILLARY
GLUCOSE-CAPILLARY: 288 mg/dL — AB (ref 65–99)
GLUCOSE-CAPILLARY: 301 mg/dL — AB (ref 65–99)
GLUCOSE-CAPILLARY: 280 mg/dL — AB (ref 65–99)
Glucose-Capillary: 278 mg/dL — ABNORMAL HIGH (ref 65–99)

## 2016-11-09 LAB — URINALYSIS, COMPLETE (UACMP) WITH MICROSCOPIC
BILIRUBIN URINE: NEGATIVE
Bacteria, UA: NONE SEEN
Ketones, ur: NEGATIVE mg/dL
LEUKOCYTES UA: NEGATIVE
NITRITE: NEGATIVE
PH: 5 (ref 5.0–8.0)
Protein, ur: NEGATIVE mg/dL
Specific Gravity, Urine: 1.029 (ref 1.005–1.030)

## 2016-11-09 LAB — TROPONIN I: TROPONIN I: 0.04 ng/mL — AB (ref <0.03)

## 2016-11-09 SURGERY — EXPLORATION, SCROTUM
Anesthesia: General | Site: Perineum | Wound class: Dirty or Infected

## 2016-11-09 MED ORDER — OXYCODONE HCL 5 MG/5ML PO SOLN: 5.0000 mg | Freq: Once | ORAL | Status: DC | PRN

## 2016-11-09 MED ORDER — ONDANSETRON HCL 4 MG/2ML IJ SOLN: 4.0000 mg | Freq: Four times a day (QID) | INTRAMUSCULAR | Status: DC | PRN

## 2016-11-09 MED ORDER — CLINDAMYCIN PHOSPHATE 900 MG/50ML IV SOLN
900.0000 mg | Freq: Once | INTRAVENOUS | Status: AC
  Administered 2016-11-09: 900 mg via INTRAVENOUS
  Filled 2016-11-09: qty 50

## 2016-11-09 MED ORDER — PIPERACILLIN-TAZOBACTAM 3.375 G IVPB 30 MIN
INTRAVENOUS | Status: AC
  Administered 2016-11-09: 3.375 g via INTRAVENOUS
  Filled 2016-11-09: qty 50

## 2016-11-09 MED ORDER — ADULT MULTIVITAMIN LIQUID CH
15.0000 mL | Freq: Every day | ORAL | Status: DC
  Administered 2016-11-10: 15 mL via ORAL
  Filled 2016-11-09 (×3): qty 15

## 2016-11-09 MED ORDER — SODIUM CHLORIDE 0.9 % IV BOLUS (SEPSIS)
1000.0000 mL | Freq: Once | INTRAVENOUS | Status: AC
  Administered 2016-11-09: 1000 mL via INTRAVENOUS

## 2016-11-09 MED ORDER — VANCOMYCIN HCL IN DEXTROSE 1-5 GM/200ML-% IV SOLN
1000.0000 mg | Freq: Once | INTRAVENOUS | Status: AC
Start: 2016-11-09 — End: 2016-11-09
  Administered 2016-11-09: 1000 mg via INTRAVENOUS
  Filled 2016-11-09: qty 200

## 2016-11-09 MED ORDER — VANCOMYCIN HCL 10 G IV SOLR
1500.0000 mg | Freq: Two times a day (BID) | INTRAVENOUS | Status: DC
  Administered 2016-11-10 (×2): 1500 mg via INTRAVENOUS
  Filled 2016-11-09 (×3): qty 1500

## 2016-11-09 MED ORDER — INSULIN ASPART 100 UNIT/ML ~~LOC~~ SOLN
SUBCUTANEOUS | Status: AC
  Filled 2016-11-09: qty 1

## 2016-11-09 MED ORDER — SUGAMMADEX SODIUM 200 MG/2ML IV SOLN
INTRAVENOUS | Status: DC | PRN
  Administered 2016-11-09: 325 mg via INTRAVENOUS

## 2016-11-09 MED ORDER — INSULIN ASPART 100 UNIT/ML ~~LOC~~ SOLN
0.0000 [IU] | Freq: Three times a day (TID) | SUBCUTANEOUS | Status: DC
  Administered 2016-11-10 (×2): 5 [IU] via SUBCUTANEOUS
  Administered 2016-11-10: 7 [IU] via SUBCUTANEOUS
  Filled 2016-11-09 (×3): qty 1

## 2016-11-09 MED ORDER — ACETAMINOPHEN 325 MG PO TABS
650.0000 mg | ORAL_TABLET | Freq: Four times a day (QID) | ORAL | Status: DC | PRN
  Administered 2016-11-10 (×2): 650 mg via ORAL
  Filled 2016-11-09 (×2): qty 2

## 2016-11-09 MED ORDER — ONDANSETRON HCL 4 MG/2ML IJ SOLN
INTRAMUSCULAR | Status: AC
  Filled 2016-11-09: qty 2

## 2016-11-09 MED ORDER — HYDROMORPHONE HCL 1 MG/ML IJ SOLN
INTRAMUSCULAR | Status: AC
  Filled 2016-11-09: qty 1

## 2016-11-09 MED ORDER — NITROGLYCERIN 0.4 MG SL SUBL: 0.4000 mg | SUBLINGUAL_TABLET | SUBLINGUAL | Status: DC | PRN

## 2016-11-09 MED ORDER — GLUCOSAMINE-CHONDROITIN 500-400 MG PO TABS: 1.0000 | ORAL_TABLET | Freq: Three times a day (TID) | ORAL | Status: DC

## 2016-11-09 MED ORDER — LIDOCAINE HCL (CARDIAC) 20 MG/ML IV SOLN
INTRAVENOUS | Status: DC | PRN
  Administered 2016-11-09: 80 mg via INTRAVENOUS

## 2016-11-09 MED ORDER — IOPAMIDOL (ISOVUE-370) INJECTION 76%
75.0000 mL | Freq: Once | INTRAVENOUS | Status: AC | PRN
  Administered 2016-11-09: 75 mL via INTRAVENOUS

## 2016-11-09 MED ORDER — FENTANYL CITRATE (PF) 100 MCG/2ML IJ SOLN
INTRAMUSCULAR | Status: DC | PRN
  Administered 2016-11-09 (×3): 50 ug via INTRAVENOUS

## 2016-11-09 MED ORDER — SODIUM CHLORIDE 0.9 % IV SOLN
INTRAVENOUS | Status: DC
  Administered 2016-11-09 – 2016-11-10 (×3): via INTRAVENOUS

## 2016-11-09 MED ORDER — PROPOFOL 10 MG/ML IV BOLUS
INTRAVENOUS | Status: DC | PRN
  Administered 2016-11-09: 150 mg via INTRAVENOUS

## 2016-11-09 MED ORDER — INSULIN REGULAR HUMAN 100 UNIT/ML IJ SOLN
5.0000 [IU] | Freq: Once | INTRAMUSCULAR | Status: AC
  Administered 2016-11-09: 5 [IU] via SUBCUTANEOUS

## 2016-11-09 MED ORDER — ONDANSETRON HCL 4 MG PO TABS: 4.0000 mg | ORAL_TABLET | Freq: Four times a day (QID) | ORAL | Status: DC | PRN

## 2016-11-09 MED ORDER — FENTANYL CITRATE (PF) 100 MCG/2ML IJ SOLN
INTRAMUSCULAR | Status: AC
  Filled 2016-11-09: qty 2

## 2016-11-09 MED ORDER — ASPIRIN EC 81 MG PO TBEC
81.0000 mg | DELAYED_RELEASE_TABLET | Freq: Every day | ORAL | Status: DC
  Administered 2016-11-10: 81 mg via ORAL
  Filled 2016-11-09: qty 1

## 2016-11-09 MED ORDER — INSULIN ASPART 100 UNIT/ML ~~LOC~~ SOLN
5.0000 [IU] | Freq: Once | SUBCUTANEOUS | Status: AC
  Administered 2016-11-09: 5 [IU] via SUBCUTANEOUS

## 2016-11-09 MED ORDER — PROPOFOL 10 MG/ML IV BOLUS
INTRAVENOUS | Status: AC
  Filled 2016-11-09: qty 20

## 2016-11-09 MED ORDER — PIPERACILLIN-TAZOBACTAM 3.375 G IVPB 30 MIN
3.3750 g | Freq: Once | INTRAVENOUS | Status: AC
  Administered 2016-11-09: 3.375 g via INTRAVENOUS

## 2016-11-09 MED ORDER — INSULIN ASPART 100 UNIT/ML ~~LOC~~ SOLN
0.0000 [IU] | Freq: Every day | SUBCUTANEOUS | Status: DC
  Administered 2016-11-09: 3 [IU] via SUBCUTANEOUS
  Filled 2016-11-09: qty 1

## 2016-11-09 MED ORDER — PIPERACILLIN-TAZOBACTAM 3.375 G IVPB
3.3750 g | Freq: Three times a day (TID) | INTRAVENOUS | Status: DC
  Administered 2016-11-10 (×2): 3.375 g via INTRAVENOUS
  Filled 2016-11-09 (×2): qty 50

## 2016-11-09 MED ORDER — ONDANSETRON HCL 4 MG/2ML IJ SOLN
INTRAMUSCULAR | Status: DC | PRN
  Administered 2016-11-09: 4 mg via INTRAVENOUS

## 2016-11-09 MED ORDER — OXYCODONE HCL 5 MG PO TABS: 5.0000 mg | ORAL_TABLET | Freq: Once | ORAL | Status: DC | PRN

## 2016-11-09 MED ORDER — ACETAMINOPHEN 650 MG RE SUPP: 650.0000 mg | Freq: Four times a day (QID) | RECTAL | Status: DC | PRN

## 2016-11-09 MED ORDER — HEPARIN SODIUM (PORCINE) 5000 UNIT/ML IJ SOLN
5000.0000 [IU] | Freq: Three times a day (TID) | INTRAMUSCULAR | Status: DC
  Administered 2016-11-09 – 2016-11-10 (×3): 5000 [IU] via SUBCUTANEOUS
  Filled 2016-11-09 (×3): qty 1

## 2016-11-09 MED ORDER — SUGAMMADEX SODIUM 500 MG/5ML IV SOLN
INTRAVENOUS | Status: AC
  Filled 2016-11-09: qty 5

## 2016-11-09 MED ORDER — FENTANYL CITRATE (PF) 100 MCG/2ML IJ SOLN
INTRAMUSCULAR | Status: AC
  Administered 2016-11-09: 25 ug via INTRAVENOUS
  Filled 2016-11-09: qty 2

## 2016-11-09 MED ORDER — LIDOCAINE HCL (PF) 2 % IJ SOLN
INTRAMUSCULAR | Status: AC
  Filled 2016-11-09: qty 2

## 2016-11-09 MED ORDER — GABAPENTIN 100 MG PO CAPS
100.0000 mg | ORAL_CAPSULE | Freq: Three times a day (TID) | ORAL | Status: DC
  Administered 2016-11-09 – 2016-11-10 (×2): 100 mg via ORAL
  Filled 2016-11-09 (×3): qty 1

## 2016-11-09 MED ORDER — MENTHOL 3 MG MT LOZG
1.0000 | LOZENGE | OROMUCOSAL | Status: DC | PRN
  Filled 2016-11-09: qty 9

## 2016-11-09 MED ORDER — ROCURONIUM BROMIDE 100 MG/10ML IV SOLN
INTRAVENOUS | Status: DC | PRN
  Administered 2016-11-09 (×2): 20 mg via INTRAVENOUS

## 2016-11-09 MED ORDER — PANTOPRAZOLE SODIUM 40 MG PO TBEC
40.0000 mg | DELAYED_RELEASE_TABLET | Freq: Every day | ORAL | Status: DC
  Administered 2016-11-10: 40 mg via ORAL
  Filled 2016-11-09: qty 1

## 2016-11-09 MED ORDER — MORPHINE SULFATE (PF) 4 MG/ML IV SOLN: 4.0000 mg | INTRAVENOUS | Status: DC | PRN

## 2016-11-09 MED ORDER — FENTANYL CITRATE (PF) 100 MCG/2ML IJ SOLN
25.0000 ug | INTRAMUSCULAR | Status: DC | PRN
  Administered 2016-11-09 (×4): 25 ug via INTRAVENOUS

## 2016-11-09 MED ORDER — HYDROMORPHONE HCL 1 MG/ML IJ SOLN
0.2500 mg | INTRAMUSCULAR | Status: DC | PRN
  Administered 2016-11-09: 0.5 mg via INTRAVENOUS

## 2016-11-09 MED ORDER — SUCCINYLCHOLINE CHLORIDE 20 MG/ML IJ SOLN
INTRAMUSCULAR | Status: DC | PRN
  Administered 2016-11-09: 100 mg via INTRAVENOUS

## 2016-11-09 MED ORDER — METOPROLOL SUCCINATE ER 50 MG PO TB24
50.0000 mg | ORAL_TABLET | Freq: Every day | ORAL | Status: DC
  Administered 2016-11-10: 50 mg via ORAL
  Filled 2016-11-09: qty 1

## 2016-11-09 MED ORDER — SUCCINYLCHOLINE CHLORIDE 20 MG/ML IJ SOLN
INTRAMUSCULAR | Status: AC
  Filled 2016-11-09: qty 1

## 2016-11-09 MED ORDER — ATORVASTATIN CALCIUM 20 MG PO TABS
20.0000 mg | ORAL_TABLET | Freq: Every day | ORAL | Status: DC
  Administered 2016-11-10: 20 mg via ORAL
  Filled 2016-11-09: qty 1

## 2016-11-09 MED ORDER — SODIUM CHLORIDE 0.9 % IV SOLN
INTRAVENOUS | Status: DC | PRN
  Administered 2016-11-09: 15:00:00 via INTRAVENOUS

## 2016-11-09 SURGICAL SUPPLY — 37 items
CANISTER SUCT 1200ML W/VALVE (MISCELLANEOUS) ×3 IMPLANT
CHLORAPREP W/TINT 26ML (MISCELLANEOUS) IMPLANT
DERMABOND ADVANCED (GAUZE/BANDAGES/DRESSINGS)
DERMABOND ADVANCED .7 DNX12 (GAUZE/BANDAGES/DRESSINGS) IMPLANT
DRAIN PENROSE 1/4X12 LTX (DRAIN) ×3 IMPLANT
DRAIN PENROSE 5/8X18 LTX STRL (WOUND CARE) ×3 IMPLANT
DRAPE LAPAROTOMY 77X122 PED (DRAPES) ×3 IMPLANT
ELECT CAUTERY BLADE TIP 2.5 (TIP) ×3
ELECT REM PT RETURN 9FT ADLT (ELECTROSURGICAL) ×3
ELECTRODE CAUTERY BLDE TIP 2.5 (TIP) ×1 IMPLANT
ELECTRODE REM PT RTRN 9FT ADLT (ELECTROSURGICAL) ×1 IMPLANT
GAUZE FLUFF 18X24 1PLY STRL (GAUZE/BANDAGES/DRESSINGS) ×9 IMPLANT
GAUZE SPONGE 4X4 12PLY STRL (GAUZE/BANDAGES/DRESSINGS) ×3 IMPLANT
GAUZE STRETCH 2X75IN STRL (MISCELLANEOUS) IMPLANT
GLOVE BIO SURGEON STRL SZ 6.5 (GLOVE) ×4 IMPLANT
GLOVE BIO SURGEON STRL SZ7 (GLOVE) ×6 IMPLANT
GLOVE BIO SURGEONS STRL SZ 6.5 (GLOVE) ×2
GOWN STRL REUS W/ TWL LRG LVL3 (GOWN DISPOSABLE) ×2 IMPLANT
GOWN STRL REUS W/TWL LRG LVL3 (GOWN DISPOSABLE) ×4
KIT RM TURNOVER STRD PROC AR (KITS) ×3 IMPLANT
LABEL OR SOLS (LABEL) ×3 IMPLANT
NEEDLE HYPO 25X1 1.5 SAFETY (NEEDLE) ×3 IMPLANT
NS IRRIG 500ML POUR BTL (IV SOLUTION) ×3 IMPLANT
PACK BASIN MINOR ARMC (MISCELLANEOUS) ×3 IMPLANT
PREP PVP WINGED SPONGE (MISCELLANEOUS) ×3 IMPLANT
SOL PREP PVP 2OZ (MISCELLANEOUS) ×3
SOLUTION PREP PVP 2OZ (MISCELLANEOUS) ×1 IMPLANT
SPONGE LAP 18X18 5 PK (GAUZE/BANDAGES/DRESSINGS) ×3 IMPLANT
SUPPORETR ATHLETIC LG (MISCELLANEOUS) IMPLANT
SUPPORTER ATHLETIC LG (MISCELLANEOUS)
SUT ETHILON 3-0 FS-10 30 BLK (SUTURE) ×3
SUT VIC AB 3-0 SH 27 (SUTURE)
SUT VIC AB 3-0 SH 27X BRD (SUTURE) IMPLANT
SUT VIC AB 4-0 SH 27 (SUTURE)
SUT VIC AB 4-0 SH 27XANBCTRL (SUTURE) IMPLANT
SUTURE EHLN 3-0 FS-10 30 BLK (SUTURE) ×1 IMPLANT
SYRINGE 10CC LL (SYRINGE) ×3 IMPLANT

## 2016-11-09 NOTE — H&P (Signed)
[facility] NAME: Howard Young    MR#:  [id number]  DATE OF BIRTH:  Oct 03, 1960  DATE OF ADMISSION:  11/09/2016  PRIMARY CARE PHYSICIAN: Birdie Sons, MD   REQUESTING/REFERRING PHYSICIAN: Dr. Brenton Grills  CHIEF COMPLAINT:   Chief Complaint  Patient presents with  . Shortness of Breath    HISTORY OF PRESENT ILLNESS:  Howard Young  is a 56 y.o. male with a known history of Diabetes type 2 without complication, GERD, hypertension, morbid obesity, obstructive sleep apnea who presents to the hospital due to shortness of breath and just not feeling well. Patient also says that he's been having pain in his groin area and his perineal area now for the past 4-5 days. He admits to some chills but no documented fever. Since he was not improving he came to the ER for further evaluation. Given her shortness of breath he underwent a CT scan of his chest with contrast which was negative for pulmonary embolism. He was noted to be tachycardic, noted to have a leukocytosis and suspected to have sepsis and therefore underwent a CT scan of his abdomen pelvis given his redness and pain in his scrotal/perineal area. The CT scan of the abdomen and pelvis was suggestive of Fournier's gangrene and therefore patient was emergently rushed to the operating room. Hospitalist services were contacted further treatment and evaluation.  PAST MEDICAL HISTORY:   Past Medical History:  Diagnosis Date  . Diabetes mellitus without complication ([place])   . GERD (gastroesophageal reflux disease)   . Hypertension   . Morbid obesity ([place])   . Sleep apnea     PAST SURGICAL HISTORY:   Past Surgical History:  Procedure Laterality Date  . BREATH TEK H PYLORI  04/25/2011   Procedure: BREATH TEK H PYLORI;  Surgeon: Pedro Earls, MD;  Location: [facility];  Service: General;  Laterality: N/A;  . CHOLECYSTECTOMY  2001  . heart ablation  1993  . JOINT  REPLACEMENT  2012   right hip  . LAPAROSCOPIC GASTRIC BANDING  2008    SOCIAL HISTORY:   Social History  Substance Use Topics  . Smoking status: Never Smoker  . Smokeless tobacco: Never Used  . Alcohol use No    FAMILY HISTORY:   Family History  Problem Relation Age of Onset  . Cancer Mother        breast, lung  . Diabetes Mother   . Heart failure Mother   . Cancer Father        colon  . Cancer Sister        breast  . Cancer Sister        ovarian    DRUG ALLERGIES:   Allergies  Allergen Reactions  . Metoclopramide Hcl Shortness Of Breath and Anxiety  . Glipizide     Profound weigh gain    REVIEW OF SYSTEMS:   Review of Systems  Constitutional: Negative for fever and weight loss.  HENT: Negative for congestion, nosebleeds and tinnitus.   Eyes: Negative for blurred vision, double vision and redness.  Respiratory: Positive for shortness of breath. Negative for cough and hemoptysis.   Cardiovascular: Negative for chest pain, orthopnea, leg swelling and PND.  Gastrointestinal: Negative for abdominal pain, diarrhea, melena, nausea and vomiting.  Genitourinary: Negative for dysuria, hematuria and urgency.       Perineal area redness, pain.   Musculoskeletal: Negative for falls and joint pain.  Neurological: Positive for weakness. Negative  for dizziness, tingling, sensory change, focal weakness, seizures and headaches.  Endo/Heme/Allergies: Negative for polydipsia. Does not bruise/bleed easily.  Psychiatric/Behavioral: Negative for depression and memory loss. The patient is not nervous/anxious.     MEDICATIONS AT HOME:   Prior to Admission medications   Medication Sig Start Date End Date Taking? Authorizing Provider  aspirin 81 MG tablet Take 81 mg by mouth daily.   Yes [provider]  atorvastatin (LIPITOR) 20 MG tablet Take 1 tablet (20 mg total) by mouth daily. 08/10/16  Yes Birdie Sons, MD  gabapentin (NEURONTIN) 100 MG capsule Take 1 capsule  (100 mg total) by mouth 3 (three) times daily. Patient taking differently: Take 100 mg by mouth daily.  09/17/15  Yes Hyatt, Max T, DPM  Ginkgo Biloba 40 MG TABS Take 1 tablet by mouth daily.    Yes [provider]  glucosamine-chondroitin 500-400 MG tablet Take 1 tablet by mouth 3 (three) times daily.   Yes [provider]  glucose blood (ONE TOUCH ULTRA TEST) test strip Check blood sugar three times daily for insulin dependent type 2 diabetes 10/24/16  Yes Fisher, Kirstie Peri, MD  Insulin Glargine (BASAGLAR KWIKPEN) 100 UNIT/ML SOPN INJECT0 UNITS INTO THE SKIN DAILY AT 10 PM. Patient taking differently: Inject 60 Units into the skin daily at 10 pm. INJECT0 UNITS INTO THE SKIN DAILY AT 10 PM. 08/10/16  Yes Birdie Sons, MD  Insulin Pen Needle (BD PEN NEEDLE NANO U/F) 32G X 4 MM MISC Use twice daily 07/23/16  Yes Birdie Sons, MD  Insulin Pen Needle 29G X 5MM MISC 2 each by Does not apply route daily. 08/10/16  Yes Birdie Sons, MD  liraglutide (VICTOZA) 18 MG/3ML SOPN Inject 0.3 mLs (1.8 mg total) into the skin every evening. 08/10/16  Yes Birdie Sons, MD  lisinopril-hydrochlorothiazide (PRINZIDE,ZESTORETIC) 20-12.5 MG tablet Take 2 tablets by mouth at bedtime. 08/10/16  Yes Birdie Sons, MD  metFORMIN (GLUCOPHAGE) 500 MG tablet Take 2 tablets (1,000 mg total) by mouth 2 (two) times daily with a meal. 08/10/16  Yes Birdie Sons, MD  metoprolol succinate (TOPROL-XL) 50 MG 24 hr tablet Take 1 tablet (50 mg total) by mouth daily. Take with or immediately following a meal. 08/10/16  Yes Fisher, Kirstie Peri, MD  modafinil (PROVIGIL) 200 MG tablet Take 1 tablet (200 mg total) by mouth daily. 08/10/16  Yes Birdie Sons, MD  Multiple Vitamins-Minerals (MULTIVITAMIN PO) Take 1 tablet by mouth daily.    Yes [provider]  naftifine (NAFTIN) 1 % cream Apply topically daily. 09/04/16  Yes Hyatt, Max T, DPM  nitroGLYCERIN (NITROSTAT) 0.4 MG SL tablet Place 1 tablet (0.4 mg  total) under the tongue every 5 (five) minutes as needed for chest pain. 10/23/16  Yes Birdie Sons, MD  pantoprazole (PROTONIX) 40 MG tablet Take 1 tablet (40 mg total) by mouth daily. 08/10/16  Yes Birdie Sons, MD  spironolactone (ALDACTONE) 50 MG tablet Take 1 tablet (50 mg total) by mouth daily. 08/10/16  Yes Birdie Sons, MD  ST JOHNS WORT EXTRACT PO Take 1 tablet by mouth daily.    Yes [provider]  ALPRAZolam Duanne Moron) 1 MG tablet Take 1/2 to 1 two times a day as needed. Patient not taking: Reported on 11/09/2016 09/24/14   Margarita Rana, MD      VITAL SIGNS:  Blood pressure (!) 167/143, pulse (!) 107, temperature 99.5 F (37.5 C), temperature source Oral, resp. rate Marland Kitchen)  28, height 6\' 4"  (1.93 m), weight (!) 152 kg (335 lb), SpO2 97 %.  PHYSICAL EXAMINATION:  Physical Exam  GENERAL:  56 y.o.-year-old obese patient lying in the bed in no acute distress.  EYES: Pupils equal, round, reactive to light and accommodation. No scleral icterus. Extraocular muscles intact.  HEENT: Head atraumatic, normocephalic. Oropharynx and nasopharynx clear. No oropharyngeal erythema, moist oral mucosa  NECK:  Supple, no jugular venous distention. No thyroid enlargement, no tenderness.  LUNGS: Normal breath sounds bilaterally, no wheezing, rales, rhonchi. No use of accessory muscles of respiration.  CARDIOVASCULAR: S1, S2 RRR. No murmurs, rubs, gallops, clicks.  ABDOMEN: Soft, nontender, nondistended. Bowel sounds present. No organomegaly or mass.  EXTREMITIES: No pedal edema, cyanosis, or clubbing. + 2 pedal & radial pulses b/l.   NEUROLOGIC: Cranial nerves II through XII are intact. No focal Motor or sensory deficits appreciated b/l PSYCHIATRIC: The patient is alert and oriented x 3. Good affect.  SKIN: No obvious rash, lesion, or ulcer.   GU:  Significant Scrotal Area redness, swelling induration but no acute drainage.   LABORATORY PANEL:   CBC  Recent Labs Lab 11/09/16 0941   WBC 32.4*  HGB 14.3  HCT 41.3  PLT 204   ------------------------------------------------------------------------------------------------------------------  Chemistries   Recent Labs Lab 11/09/16 0941  NA 132*  K 4.0  CL 96*  CO2 25  GLUCOSE 299*  BUN 41*  CREATININE 2.05*  CALCIUM 9.0  AST 44*  ALT 43  ALKPHOS 69  BILITOT 3.6*   ------------------------------------------------------------------------------------------------------------------  Cardiac Enzymes  Recent Labs Lab 11/09/16 0941  TROPONINI 0.04*   ------------------------------------------------------------------------------------------------------------------  RADIOLOGY:  Ct Abdomen Pelvis Wo Contrast  Result Date: 11/09/2016 CLINICAL DATA:  Rectal pain. EXAM: CT ABDOMEN AND PELVIS WITHOUT CONTRAST TECHNIQUE: Multidetector CT imaging of the abdomen and pelvis was performed following the standard protocol without IV contrast. COMPARISON:  None. FINDINGS: Lower chest: No acute abnormality. Hepatobiliary: Status post cholecystectomy. Fatty infiltration of the liver is noted. Pancreas: Minimal inflammatory changes are noted around the pancreatic head, and clinical correlation is recommended evaluate for possible acute pancreatitis. Spleen: Normal in size without focal abnormality. Adrenals/Urinary Tract: Adrenal glands appear normal. No hydronephrosis or renal obstruction is noted. There is noted contrast in both intrarenal collecting systems, ureters and urinary bladder, from previous contrast injection. Left kidney appears normal. 3 cm exophytic simple cyst is seen arising anteriorly from right kidney. Small cyst is seen in upper pole. 12 mm hyperdense focus is noted in midpole of right kidney most consistent with hyperdense cyst. Stomach/Bowel: Status post lap band procedure involving proximal stomach. There is no evidence of bowel obstruction or inflammation. The appendix appears normal. Vascular/Lymphatic: Aortic  atherosclerosis. No enlarged abdominal or pelvic lymph nodes. Reproductive: Prostate is unremarkable. Other: There is a large amount of soft tissue gas in the left inguinal region which extends into the left perineal region concerning for Fournier's gangrene. A gas is seen to extend posteriorly near the anus. No definite abscess is noted. No definite hernia is noted. Musculoskeletal: Status post right hip arthroplasty. No acute osseous abnormality is noted. IMPRESSION: Large amount of soft tissue gas seen in the left inguinal, perineal and perianal regions concerning for Fournier's gangrene. These results were called by telephone at the time of interpretation on 11/09/2016 at 1:58 pm to Dr. Carrie Mew , who verbally acknowledged these results. Minimal stranding is noted around the pancreatic head, with may represent pancreatitis. Clinical hour tori correlation is recommended. Fatty infiltration of the liver. Aortic  atherosclerosis. Several right renal cysts are noted. 12 mm hyperdense focus is noted in midpole of right kidney which may represent hyperdense cyst, but ultrasound is recommended for confirmation and to rule out mass. Electronically Signed   By: Marijo Conception, M.D.   On: 11/09/2016 13:59   Ct Angio Chest Pe W And/or Wo Contrast  Result Date: 11/09/2016 CLINICAL DATA:  Shortness of Breath EXAM: CT ANGIOGRAPHY CHEST WITH CONTRAST TECHNIQUE: Multidetector CT imaging of the chest was performed using the standard protocol during bolus administration of intravenous contrast. Multiplanar CT image reconstructions and MIPs were obtained to evaluate the vascular anatomy. CONTRAST:  75 mL Isovue 370 nonionic COMPARISON:  Chest radiograph and chest CT October 09, 2016 FINDINGS: Cardiovascular: There is no demonstrable pulmonary embolus. There is no thoracic aortic aneurysm or dissection. The visualized great vessels appear unremarkable. Pericardium is not appreciably thickened. The main pulmonary outflow  tract measures 3.3 cm in diameter, a finding suggesting underlying pulmonary arterial hypertension. There is a slight degree of coronary artery calcification. Mediastinum/Nodes: There are benign-appearing calcifications in the anterior right lobe of the thyroid, stable. No well-defined thyroid mass evident. There are scattered calcified sub- carinal lymph nodes. No adenopathy by size criteria is evident on this study. No esophageal lesions are appreciable. Lungs/Pleura: There is no edema or consolidation. No pleural effusion or pleural thickening. Upper Abdomen: There is hepatic steatosis. Gallbladder is absent. A lap band is in position at the gastric cardia. Musculoskeletal: There are no blastic or lytic bone lesions. Review of the MIP images confirms the above findings. IMPRESSION: 1.  No demonstrable pulmonary embolus. 2. Prominence of the main pulmonary outflow tract. Suspect a degree of pulmonary arterial hypertension. 3.  Modest coronary artery calcification. 4.  No lung edema or consolidation.  No pleural effusion. 5. No adenopathy by size criteria. Several calcified sub- carinal lymph nodes, indicative of prior granulomatous disease. 6.  Hepatic steatosis. 7.  Gallbladder absent. 8.  Lap band positioned at gastric cardia. Electronically Signed   By: Lowella Grip III M.D.   On: 11/09/2016 12:21     IMPRESSION AND PLAN:   56 year old male with past history of hypertension, hyperlipidemia, diabetes, sleep apnea, morbid obesity who presents to the hospital due to shortness of breath and noted to have significant scrotal/perineal area redness swelling and induration with CT scan findings suggestive of Fournier's gangrene.  1. Sepsis-patient meets criteria given his leukocytosis, tachycardia and CT scan abdomen pelvis findings suggestive of Fournier's gangrene which is likely the source.  -continue supportive care with IV fluids, broad-spectrum IV antibiotics with vancomycin, Zosyn. Follow  hemodynamics, follow blood, wound cultures. Patient is going to the OR for incision and drainage and debridement today.  2. 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 Continue broad-spectrum IV antibiotics with vancomycin, Zosyn. -Urology has been consulted, patient go to the OR today. Continue further care as per urology. -Consider infectious disease consult in the morning.  3. Acute kidney injury-secondary to sepsis-likely ATN. Next-we'll hydrate the patient with IV fluids, follow BUN and creatinine, urine output. Renal dose meds, avoid nephrotoxins.  4. Diabetes type 2 without complication-hold patient's metformin, scheduled insulin for now. Place patient on sliding scale insulin.  5. Leukocytosis-secondary to #2. Continue IV antibiotics, follow white cell count.  6. Essential hypertension-continue Toprol  7. Hyperlipidemia-continue atorvastatin.  8. Diabetic neuropathy-continue gabapentin.  All the records are reviewed and case discussed with ED provider. Management plans discussed with the patient, family and they are in agreement.  CODE STATUS: Full code  TOTAL TIME TAKING CARE OF THIS PATIENT: 45 minutes.    Henreitta Leber M.D on 11/09/2016 at 3:13 PM  Between 7am to 6pm - Pager - [phone]  After 6pm go to [email or web address] - password [email or web address] [facility]  Office  [phone]  CC: Primary care physician; Birdie Sons, MD

## 2016-11-09 NOTE — Anesthesia Postprocedure Evaluation (Signed)
Anesthesia Post Note  Patient: Howard Young  Procedure(s) Performed: Procedure(s) (LRB): SCROTUM EXPLORATION (N/A)  Patient location during evaluation: PACU Anesthesia Type: General Level of consciousness: awake and alert Pain management: pain level controlled Vital Signs Assessment: post-procedure vital signs reviewed and stable Respiratory status: spontaneous breathing, nonlabored ventilation, respiratory function stable and patient connected to nasal cannula oxygen Cardiovascular status: blood pressure returned to baseline and stable Postop Assessment: no apparent nausea or vomiting Anesthetic complications: no     Last Vitals:  Vitals:   11/09/16 1656 11/09/16 1703  BP: 117/76 125/76  Pulse: (!) 112 (!) 110  Resp: (!) 21 (!) 22  Temp:    SpO2: 94% 95%    Last Pain:  Vitals:   11/09/16 1703  TempSrc:   PainSc: 7                  Jacie Tristan S

## 2016-11-09 NOTE — Care Management (Signed)
Blood glucose approximately where he has been for today. Will give 5u of novolog to keep him under 300. Then, sliding scale in the ICU.

## 2016-11-09 NOTE — ED Triage Notes (Signed)
Pt reports SOB that began Monday. Denies cough. Denies fever at home. Pt reports rectal pain stating he does not know if he has a hemorrhoid or not. Pt speaking in complete sentences. Pt with noted dyspnea at rest, room air SpO2 96%.

## 2016-11-09 NOTE — Consult Note (Addendum)
11/09/2016 2:35 PM   Howard Young [birth year] [id number]  Referring provider: Dr. Brenton Grills  CC: Fournier's gangrene  HPI: The patient is a 56 year old gentleman with a past medical history of diabetes present in the hospital initially shortness of breath. During workup, he was found to have leukocytosis and a CT scan was performed. A large amount of soft tissue gas was seen in the left inguinal, perineal, and perineal regions concerning for Fournier's gangrene. Urology was consulted for further workup. The patient notes for the last 4 days that he is becoming increasingly lethargic with increasing shortness of breath. He has been febrile and weak. He is only been able to perform job duties for approximately one hour or so before requiring a nap. He also noticed pain around his scrotum and groin. He's never experienced anything like this before. In the ED his white count was found to be 32.  He is also tachycardic with bouts of hypotension. Urologist for further management of his Fournier's gangrene.   PMH: Past Medical History:  Diagnosis Date  . Diabetes mellitus without complication ([place])   . GERD (gastroesophageal reflux disease)   . Hypertension   . Morbid obesity ([place])   . Sleep apnea     Surgical History: Past Surgical History:  Procedure Laterality Date  . BREATH TEK H PYLORI  04/25/2011   Procedure: BREATH TEK H PYLORI;  Surgeon: Pedro Earls, MD;  Location: [facility];  Service: General;  Laterality: N/A;  . CHOLECYSTECTOMY  2001  . heart ablation  1993  . JOINT REPLACEMENT  2012   right hip  . LAPAROSCOPIC GASTRIC BANDING  2008    Allergies:  Allergies  Allergen Reactions  . Metoclopramide Hcl Shortness Of Breath and Anxiety  . Glipizide     Profound weigh gain    Family History: Family History  Problem Relation Age of Onset  . Cancer Mother        breast, lung  . Cancer Father        colon  . Cancer Sister        breast  . Cancer  Sister        ovarian    Social History:  reports that he has never smoked. He has never used smokeless tobacco. He reports that he does not drink alcohol or use drugs.  ROS: 12 point ROS negative except for above  Physical Exam: BP 124/78   Pulse (!) 107   Temp 99.5 F (37.5 C) (Oral)   Resp 18   Ht 6\' 4"  (1.93 m)   Wt (!) 335 lb (152 kg)   SpO2 96%   BMI 40.78 kg/m   Constitutional:  Alert and oriented, No acute distress. HEENT: [place] AT, moist mucus membranes.  Trachea midline, no masses. Lungs clear Cardiovascular: No clubbing, cyanosis, or edema. RRR Respiratory: Normal respiratory effort, no increased work of breathing. GI: Abdomen is soft, nontender, nondistended, no abdominal masses GU: No CVA tenderness. Normal phallus. The perineum is edematous and erythematous. This extends into the left inguinal crease. His scrotum is tender to palpation. His perineum is tender to palpation. There is no obvious crepitus on exam. Though there is significant free air seen on CT. Skin: No rashes, bruises or suspicious lesions. Lymph: No cervical or inguinal adenopathy. Neurologic: Grossly intact, no focal deficits, moving all 4 extremities. Psychiatric: Normal mood and affect.  Laboratory Data: Lab Results  Component Value Date   WBC 32.4 (H) 11/09/2016   HGB 14.3  11/09/2016   HCT 41.3 11/09/2016   MCV 83.8 11/09/2016   PLT 204 11/09/2016    Lab Results  Component Value Date   CREATININE 2.05 (H) 11/09/2016    No results found for: PSA  No results found for: TESTOSTERONE  Lab Results  Component Value Date   HGBA1C 10.1 06/27/2016    Urinalysis    Component Value Date/Time   COLORURINE Yellow 06/21/2012 0808   APPEARANCEUR Clear 06/21/2012 0808   LABSPEC 1.016 06/21/2012 0808   PHURINE 5.0 06/21/2012 0808   GLUCOSEU >=500 06/21/2012 0808   HGBUR 1+ 06/21/2012 0808   BILIRUBINUR Negative 06/21/2012 0808   KETONESUR Negative 06/21/2012 0808   PROTEINUR Negative  06/21/2012 0808   NITRITE Negative 06/21/2012 0808   LEUKOCYTESUR Negative 06/21/2012 0808    Pertinent Imaging: CLINICAL DATA:  Rectal pain.  EXAM: CT ABDOMEN AND PELVIS WITHOUT CONTRAST  TECHNIQUE: Multidetector CT imaging of the abdomen and pelvis was performed following the standard protocol without IV contrast.  COMPARISON:  None.  FINDINGS: Lower chest: No acute abnormality.  Hepatobiliary: Status post cholecystectomy. Fatty infiltration of the liver is noted.  Pancreas: Minimal inflammatory changes are noted around the pancreatic head, and clinical correlation is recommended evaluate for possible acute pancreatitis.  Spleen: Normal in size without focal abnormality.  Adrenals/Urinary Tract: Adrenal glands appear normal. No hydronephrosis or renal obstruction is noted. There is noted contrast in both intrarenal collecting systems, ureters and urinary bladder, from previous contrast injection. Left kidney appears normal. 3 cm exophytic simple cyst is seen arising anteriorly from right kidney. Small cyst is seen in upper pole. 12 mm hyperdense focus is noted in midpole of right kidney most consistent with hyperdense cyst.  Stomach/Bowel: Status post lap band procedure involving proximal stomach. There is no evidence of bowel obstruction or inflammation. The appendix appears normal.  Vascular/Lymphatic: Aortic atherosclerosis. No enlarged abdominal or pelvic lymph nodes.  Reproductive: Prostate is unremarkable.  Other: There is a large amount of soft tissue gas in the left inguinal region which extends into the left perineal region concerning for Fournier's gangrene. A gas is seen to extend posteriorly near the anus. No definite abscess is noted. No definite hernia is noted.  Musculoskeletal: Status post right hip arthroplasty. No acute osseous abnormality is noted.  IMPRESSION: Large amount of soft tissue gas seen in the left inguinal,  perineal and perianal regions concerning for Fournier's gangrene. These results were called by telephone at the time of interpretation on 11/09/2016 at 1:58 pm to Dr. Carrie Mew , who verbally acknowledged these results.  Minimal stranding is noted around the pancreatic head, with may represent pancreatitis. Clinical hour tori correlation is recommended.  Fatty infiltration of the liver.  Aortic atherosclerosis.  Several right renal cysts are noted. 12 mm hyperdense focus is noted in midpole of right kidney which may represent hyperdense cyst, but ultrasound is recommended for confirmation and to rule out mass.   Assessment & Plan:    1. Fournier's gangrene I discussed with the patient the emergent surgical indication for debridement and the patient is a 52's gangrene. I shared with him my concern of the potential for this infectious proximal Silastic quickly spread up his abdominal wall and down his thighs. We discussed debridement of his perineum, inguinal regions, and gluteal skin. We did discuss that he may lose a significant amount of skin during this procedure. He will need future skin grafting. We also discussed possible debridement of the scrotum resulting in his testicles being  externalized temporarily. We discussed the likely plastic surgical repair at some point for this problem. We discussed the emergent nature of this disease in need to proceed immediately. All questions were answered. All risks, benefits, indications were discussed in great detail. All questions were answered. The patient's was started on broad-spectrum antibiotics. He will be admitted to the hospitalist service postoperatively for urology will follow closely.  Nickie Retort, MD

## 2016-11-09 NOTE — Progress Notes (Signed)
Spoke with Dr. Pilar Jarvis from Urology and pt. Likely will need transfer to a tertiary care center as he will need Plastic Surgery and reconstruction which cannot be done here.   He placed a call out to [facility] but they did not accept the patient.  He will call [facility] and help [place] transfer this patient to a tertiary care center which is likely to happen tomorrow.   Discussed w/ Dr. Pilar Jarvis that I will change his admission from Step down level of care to the any med/surg with tele and he was o.k. With that.  Notified PACU staff and nurse about this.

## 2016-11-09 NOTE — Addendum Note (Signed)
Addendum  created 11/09/16 1707 by Gunnar Bulla, MD   Sign clinical note

## 2016-11-09 NOTE — Progress Notes (Signed)
Pharmacy Antibiotic Note  Howard Young is a 56 y.o. male admitted on 11/09/2016 with sepsis.  Pharmacy has been consulted for vancomycin/Zosyn dosing.Patient has history significant for gastric banding. Patient received Zosyn and vancomycin x 1 in ED.   Plan: Will continue Zosyn EI 3.375g IV Q8hr.   Will continue vancomycin 1500mg  IV Q12hr for goal trough of 15-20. Will obtain trough prior to am dose on 9/29.   Will need to monitor renal function daily as patient is obese and is receiving Zosyn and vancomycin.   Height: 6\' 4"  (193 cm) Weight: (!) 335 lb (152 kg) IBW/kg (Calculated) : 86.8  Temp (24hrs), Avg:98.8 F (37.1 C), Min:98 F (36.7 C), Max:99.5 F (37.5 C)   Recent Labs Lab 11/09/16 0941 11/09/16 1118  WBC 32.4*  --   CREATININE 2.05*  --   LATICACIDVEN  --  1.7    Estimated Creatinine Clearance: 63.5 mL/min (A) (by C-G formula based on SCr of 2.05 mg/dL (H)).    Allergies  Allergen Reactions  . Metoclopramide Hcl Shortness Of Breath and Anxiety  . Glipizide     Profound weigh gain    Antimicrobials this admission: Zosyn  9/27 >>  Vancomycin 9/27 >>   Dose adjustments this admission: N/A  Microbiology results: 9/27 BCx: pending  Thank you for allowing pharmacy to be a part of this patient's care.  Simpson,Michael L 11/09/2016 12:08 PM

## 2016-11-09 NOTE — ED Provider Notes (Signed)
[facility] Emergency Department Provider Note  ____________________________________________  Time seen: Approximately 9:39 AM  I have reviewed the triage vital signs and the nursing notes.   HISTORY  Chief Complaint Shortness of Breath    HPI Howard Young is a 56 y.o. male who complains of shortness of breath for the past 4 days, constant. Worse with any exertion at all. When he walks around his house he has to lay down for 5 or 10 minutes to recover. Denies chest pain or cough. No fevers chills or sweats. He also reports that his blood sugar has been running high, and he hasn't been able to drink enough fluids and so he is urinating very infrequently and feels very parched. No alleviating factors to the shortness of breath, rates it as severe. No radiating or pleuritic pain   he has a history of PE 12 years ago.  sugar is been running about 300 at home   Past Medical History:  Diagnosis Date  . Diabetes mellitus without complication ([place])   . GERD (gastroesophageal reflux disease)   . Hypertension   . Morbid obesity ([place])   . Sleep apnea      Patient Active Problem List   Diagnosis Date Noted  . PVC (premature ventricular contraction) 08/16/2015  . Anxiety 09/15/2014  . Elevated CK 09/15/2014  . Essential hypertension 09/15/2014  . Hypertriglyceridemia 09/15/2014  . Hypotestosteronism 09/15/2014  . NASH (nonalcoholic steatohepatitis) 09/15/2014  . Adiposity 09/15/2014  . PE (pulmonary embolism) 09/15/2014  . Diabetes mellitus with diabetic neuropathy ([place]) 09/15/2014  . Apnea, sleep 09/15/2014  . Right shoulder pain 08/31/2014  . Male hypogonadism 06/10/2013  . 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 Metabolic syndrome 16/11/9602  . Complication of gastric banding-insufficient weight loss 11/29/2011  . Obesity-BMI 41 03/31/2011  . Status post gastric banding-APL 2008 03/31/2011  . Pulmonary embolism-2005 03/31/2011  . WPW  (Wolff-Parkinson-White syndrome)-cardiac ablation 1993 03/31/2011  . S/P cholecystectomy-2001 03/31/2011     Past Surgical History:  Procedure Laterality Date  . BREATH TEK H PYLORI  04/25/2011   Procedure: BREATH TEK H PYLORI;  Surgeon: Pedro Earls, MD;  Location: [facility];  Service: General;  Laterality: N/A;  . CHOLECYSTECTOMY  2001  . heart ablation  1993  . JOINT REPLACEMENT  2012   right hip  . LAPAROSCOPIC GASTRIC BANDING  2008     Prior to Admission medications   Medication Sig Start Date End Date Taking? Authorizing Provider  ALPRAZolam Duanne Moron) 1 MG tablet Take 1/2 to 1 two times a day as needed. 09/24/14  Yes Margarita Rana, MD  aspirin 81 MG tablet Take 81 mg by mouth daily.   Yes [provider]  atorvastatin (LIPITOR) 20 MG tablet Take 1 tablet (20 mg total) by mouth daily. 08/10/16  Yes Birdie Sons, MD  gabapentin (NEURONTIN) 100 MG capsule Take 1 capsule (100 mg total) by mouth 3 (three) times daily. Patient taking differently: Take 100 mg by mouth daily.  09/17/15  Yes Hyatt, Max T, DPM  Ginkgo Biloba 40 MG TABS Take 1 tablet by mouth daily.    Yes [provider]  glucosamine-chondroitin 500-400 MG tablet Take 1 tablet by mouth 3 (three) times daily.   Yes [provider]  Insulin Glargine (BASAGLAR KWIKPEN) 100 UNIT/ML SOPN INJECT0 UNITS INTO THE SKIN DAILY AT 10 PM. Patient taking differently: Inject 60 Units into the skin daily at 10 pm. INJECT0 UNITS INTO THE SKIN DAILY AT 10 PM. 08/10/16  Yes Birdie Sons, MD  liraglutide (VICTOZA) 18 MG/3ML SOPN Inject 0.3 mLs (1.8 mg total) into the skin every evening. 08/10/16  Yes Birdie Sons, MD  lisinopril-hydrochlorothiazide (PRINZIDE,ZESTORETIC) 20-12.5 MG tablet Take 2 tablets by mouth at bedtime. 08/10/16  Yes Birdie Sons, MD  metFORMIN (GLUCOPHAGE) 500 MG tablet Take 2 tablets (1,000 mg total) by mouth 2 (two) times daily with a meal. 08/10/16  Yes Birdie Sons, MD   metoprolol succinate (TOPROL-XL) 50 MG 24 hr tablet Take 1 tablet (50 mg total) by mouth daily. Take with or immediately following a meal. 08/10/16  Yes Fisher, Kirstie Peri, MD  modafinil (PROVIGIL) 200 MG tablet Take 1 tablet (200 mg total) by mouth daily. 08/10/16  Yes Birdie Sons, MD  Multiple Vitamins-Minerals (MULTIVITAMIN PO) Take 1 tablet by mouth daily.    Yes [provider]  naftifine (NAFTIN) 1 % cream Apply topically daily. 09/04/16  Yes Hyatt, Max T, DPM  nitroGLYCERIN (NITROSTAT) 0.4 MG SL tablet Place 1 tablet (0.4 mg total) under the tongue every 5 (five) minutes as needed for chest pain. 10/23/16  Yes Birdie Sons, MD  pantoprazole (PROTONIX) 40 MG tablet Take 1 tablet (40 mg total) by mouth daily. 08/10/16  Yes Birdie Sons, MD  spironolactone (ALDACTONE) 50 MG tablet Take 1 tablet (50 mg total) by mouth daily. 08/10/16  Yes Birdie Sons, MD  ST JOHNS WORT EXTRACT PO Take 1 tablet by mouth daily.    Yes [provider]  glucose blood (ONE TOUCH ULTRA TEST) test strip Check blood sugar three times daily for insulin dependent type 2 diabetes 10/24/16   Birdie Sons, MD  Insulin Pen Needle (BD PEN NEEDLE NANO U/F) 32G X 4 MM MISC Use twice daily 07/23/16   Birdie Sons, MD  Insulin Pen Needle 29G X 5MM MISC 2 each by Does not apply route daily. 08/10/16   Birdie Sons, MD     Allergies Metoclopramide hcl and Glipizide   Family History  Problem Relation Age of Onset  . Cancer Mother        breast, lung  . Cancer Father        colon  . Cancer Sister        breast  . Cancer Sister        ovarian    Social History Social History  Substance Use Topics  . Smoking status: Never Smoker  . Smokeless tobacco: Never Used  . Alcohol use No    Review of Systems  Constitutional:   No fever or chills.  ENT:   No sore throat. No rhinorrhea. Cardiovascular:   No chest pain or syncope. Respiratory:   positive shortness of breath without  cough. Gastrointestinal:   Negative for abdominal pain, vomiting and diarrhea.  Musculoskeletal:   Negative for focal pain or swelling All other systems reviewed and are negative except as documented above in ROS and HPI.  ____________________________________________   PHYSICAL EXAM:  VITAL SIGNS: ED Triage Vitals  Enc Vitals Group     BP 11/09/16 0909 127/66     Pulse Rate 11/09/16 0909 (!) 112     Resp 11/09/16 0909 (!) 22     Temp 11/09/16 0909 98 F (36.7 C)     Temp Source 11/09/16 0909 Oral     SpO2 11/09/16 0909 96 %     Weight 11/09/16 0910 (!) 335 lb (152 kg)     Height 11/09/16 0910 6'  4" (1.93 m)     Head Circumference --      Peak Flow --      Pain Score 11/09/16 0932 2     Pain Loc --      Pain Edu? --      Excl. in [place]? --     Vital signs reviewed, nursing assessments reviewed.   Constitutional:   Alert and oriented. not in distress Eyes:   No scleral icterus.  EOMI. No nystagmus. No conjunctival pallor. PERRL. ENT   Head:   Normocephalic and atraumatic.   Nose:   No congestion/rhinnorhea.    Mouth/Throat:   dry mucous membranes, no pharyngeal erythema. No peritonsillar mass.    Neck:   No meningismus. Full ROM Hematological/Lymphatic/Immunilogical:   No cervical lymphadenopathy. Cardiovascular:   tachycardia heart rate 110. Symmetric bilateral radial and DP pulses.  No murmurs.  Respiratory:   Normal respiratory effort without tachypnea/retractions. Breath sounds are clear and equal bilaterally. No wheezes/rales/rhonchi. Gastrointestinal:   Soft and nontender. Non distended. There is no CVA tenderness.  No rebound, rigidity, or guarding. Genitourinary:   deferred Musculoskeletal:   Normal range of motion in all extremities. No joint effusions.  No lower extremity tenderness.  No edema. Neurologic:   Normal speech and language.  Motor grossly intact. No gross focal neurologic deficits are appreciated.  Skin:    Skin is warm, dry and intact.  No rash noted.  No petechiae, purpura, or bullae.  ____________________________________________    LABS (pertinent positives/negatives) (all labs ordered are listed, but only abnormal results are displayed) Labs Reviewed  COMPREHENSIVE METABOLIC PANEL - Abnormal; Notable for the following:       Result Value   Sodium 132 (*)    Chloride 96 (*)    Glucose, Bld 299 (*)    BUN 41 (*)    Creatinine, Ser 2.05 (*)    AST 44 (*)    Total Bilirubin 3.6 (*)    GFR calc non Af Amer 34 (*)    GFR calc Af Amer 40 (*)    All other components within normal limits  TROPONIN I - Abnormal; Notable for the following:    Troponin I 0.04 (*)    All other components within normal limits  CBC WITH DIFFERENTIAL/PLATELET - Abnormal; Notable for the following:    WBC 32.4 (*)    Neutro Abs 27.9 (*)    Monocytes Absolute 3.2 (*)    All other components within normal limits  GLUCOSE, CAPILLARY - Abnormal; Notable for the following:    Glucose-Capillary 278 (*)    All other components within normal limits  CULTURE, BLOOD (ROUTINE X 2)  CULTURE, BLOOD (ROUTINE X 2)  LACTIC ACID, PLASMA  URINALYSIS, COMPLETE (UACMP) WITH MICROSCOPIC   ____________________________________________   EKG  interpreted by me Sinus tachycardia rate 108, normal axis and intervals. Poor R-wave progression in anterior precordial leads. Normal ST segments and T waves.  ____________________________________________    RADIOLOGY  Ct Abdomen Pelvis Wo Contrast  Result Date: 11/09/2016 CLINICAL DATA:  Rectal pain. EXAM: CT ABDOMEN AND PELVIS WITHOUT CONTRAST TECHNIQUE: Multidetector CT imaging of the abdomen and pelvis was performed following the standard protocol without IV contrast. COMPARISON:  None. FINDINGS: Lower chest: No acute abnormality. Hepatobiliary: Status post cholecystectomy. Fatty infiltration of the liver is noted. Pancreas: Minimal inflammatory changes are noted around the pancreatic head, and clinical  correlation is recommended evaluate for possible acute pancreatitis. Spleen: Normal in size without focal abnormality. Adrenals/Urinary Tract: Adrenal  glands appear normal. No hydronephrosis or renal obstruction is noted. There is noted contrast in both intrarenal collecting systems, ureters and urinary bladder, from previous contrast injection. Left kidney appears normal. 3 cm exophytic simple cyst is seen arising anteriorly from right kidney. Small cyst is seen in upper pole. 12 mm hyperdense focus is noted in midpole of right kidney most consistent with hyperdense cyst. Stomach/Bowel: Status post lap band procedure involving proximal stomach. There is no evidence of bowel obstruction or inflammation. The appendix appears normal. Vascular/Lymphatic: Aortic atherosclerosis. No enlarged abdominal or pelvic lymph nodes. Reproductive: Prostate is unremarkable. Other: There is a large amount of soft tissue gas in the left inguinal region which extends into the left perineal region concerning for Fournier's gangrene. A gas is seen to extend posteriorly near the anus. No definite abscess is noted. No definite hernia is noted. Musculoskeletal: Status post right hip arthroplasty. No acute osseous abnormality is noted. IMPRESSION: Large amount of soft tissue gas seen in the left inguinal, perineal and perianal regions concerning for Fournier's gangrene. These results were called by telephone at the time of interpretation on 11/09/2016 at 1:58 pm to Dr. Carrie Mew , who verbally acknowledged these results. Minimal stranding is noted around the pancreatic head, with may represent pancreatitis. Clinical hour tori correlation is recommended. Fatty infiltration of the liver. Aortic atherosclerosis. Several right renal cysts are noted. 12 mm hyperdense focus is noted in midpole of right kidney which may represent hyperdense cyst, but ultrasound is recommended for confirmation and to rule out mass. Electronically Signed   By:  Marijo Conception, M.D.   On: 11/09/2016 13:59   Ct Angio Chest Pe W And/or Wo Contrast  Result Date: 11/09/2016 CLINICAL DATA:  Shortness of Breath EXAM: CT ANGIOGRAPHY CHEST WITH CONTRAST TECHNIQUE: Multidetector CT imaging of the chest was performed using the standard protocol during bolus administration of intravenous contrast. Multiplanar CT image reconstructions and MIPs were obtained to evaluate the vascular anatomy. CONTRAST:  75 mL Isovue 370 nonionic COMPARISON:  Chest radiograph and chest CT October 09, 2016 FINDINGS: Cardiovascular: There is no demonstrable pulmonary embolus. There is no thoracic aortic aneurysm or dissection. The visualized great vessels appear unremarkable. Pericardium is not appreciably thickened. The main pulmonary outflow tract measures 3.3 cm in diameter, a finding suggesting underlying pulmonary arterial hypertension. There is a slight degree of coronary artery calcification. Mediastinum/Nodes: There are benign-appearing calcifications in the anterior right lobe of the thyroid, stable. No well-defined thyroid mass evident. There are scattered calcified sub- carinal lymph nodes. No adenopathy by size criteria is evident on this study. No esophageal lesions are appreciable. Lungs/Pleura: There is no edema or consolidation. No pleural effusion or pleural thickening. Upper Abdomen: There is hepatic steatosis. Gallbladder is absent. A lap band is in position at the gastric cardia. Musculoskeletal: There are no blastic or lytic bone lesions. Review of the MIP images confirms the above findings. IMPRESSION: 1.  No demonstrable pulmonary embolus. 2. Prominence of the main pulmonary outflow tract. Suspect a degree of pulmonary arterial hypertension. 3.  Modest coronary artery calcification. 4.  No lung edema or consolidation.  No pleural effusion. 5. No adenopathy by size criteria. Several calcified sub- carinal lymph nodes, indicative of prior granulomatous disease. 6.  Hepatic  steatosis. 7.  Gallbladder absent. 8.  Lap band positioned at gastric cardia. Electronically Signed   By: Lowella Grip III M.D.   On: 11/09/2016 12:21    ____________________________________________   PROCEDURES Procedures CRITICAL CARE Performed by:  Temple Ewart   Total critical care time: 35 minutes  Critical care time was exclusive of separately billable procedures and treating other patients.  Critical care was necessary to treat or prevent imminent or life-threatening deterioration.  Critical care was time spent personally by me on the following activities: development of treatment plan with patient and/or surrogate as well as nursing, discussions with consultants, evaluation of patient's response to treatment, examination of patient, obtaining history from patient or surrogate, ordering and performing treatments and interventions, ordering and review of laboratory studies, ordering and review of radiographic studies, pulse oximetry and re-evaluation of patient's condition.  ____________________________________________   INITIAL IMPRESSION / ASSESSMENT AND PLAN / ED COURSE  Pertinent labs & imaging results that were available during my care of the patient were reviewed by me and considered in my medical decision making (see chart for details).  patient presents with shortness of breath, differential includes pneumonia, pneumothorax, pulmonary embolismor metabolic acidosis. Low suspicion for ACS dissection carditis.we'll check labs urinalysis and CT angiogram chest.  Clinical Course as of Nov 09 1416  Thu Nov 09, 2016  1047 Check lactate, blood cs, give vanc and zosyn due to SIRS, pending imaging. Bp and mental status nl.  WBC: (!) 32.4 [PS]  1255 Pt now indicates he has pain and redness in the groin x 4 days as well. On exam this has induration, warmth, tenderness in perineum and posterior scrotum. No crepitus. Will continue IVF, CT non contrast of abd/pelvis to eval  for nec fasciitis. Plan to admit for sepsis from cellulitis.  [PS]  0814 D/w radiology. Reveals extensive sub Q gas in inguinal region corresponding to inflammatory findings on exam. Concern for fournier's, will d/w surgery/urology  [PS]  4818 discuss with urology Dr. Pilar Jarvis. We will plan for emergent surgical exploration and debridement. IV  clindamycin added. Patient updated and agreeable. Case d/w hospitalist for further inpatient care after surgery, as he will require ongoing care in the setting of sepsis, ckd, diabetes.  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 Dictation errors may occur despite best attempts at proofreading.    Carrie Mew, MD 11/09/16 [phone]

## 2016-11-09 NOTE — Anesthesia Preprocedure Evaluation (Signed)
Anesthesia Evaluation  Patient identified by MRN, date of birth, ID band Patient awake    Reviewed: Allergy & Precautions, H&P , NPO status , Patient's Chart, lab work & pertinent test results  History of Anesthesia Complications Negative for: history of anesthetic complications  Airway Mallampati: III  TM Distance: <3 FB Neck ROM: full    Dental  (+) Chipped   Pulmonary neg shortness of breath, sleep apnea and Continuous Positive Airway Pressure Ventilation ,           Cardiovascular Exercise Tolerance: Good hypertension, (-) angina(-) Past MI and (-) DOE      Neuro/Psych PSYCHIATRIC DISORDERS Anxiety negative neurological ROS     GI/Hepatic negative GI ROS, GERD  Controlled and Medicated,(+) Hepatitis -  Endo/Other  diabetes, Poorly Controlled, Type 2, Insulin Dependent  Renal/GU      Musculoskeletal   Abdominal   Peds  Hematology negative hematology ROS (+)   Anesthesia Other Findings Past Medical History: No date: Diabetes mellitus without complication ([facility]) No date: GERD (gastroesophageal reflux disease) No date: Hypertension No date: Morbid obesity ([place]) No date: Sleep apnea  Past Surgical History: 04/25/2011: BREATH TEK H PYLORI     Comment:  Procedure: BREATH TEK H PYLORI;  Surgeon: Pedro Earls, MD;  Location: [facility];  Service: General;                Laterality: N/A; 2001: CHOLECYSTECTOMY 1993: heart ablation 2012: JOINT REPLACEMENT     Comment:  right hip 2008: LAPAROSCOPIC GASTRIC BANDING  BMI    Body Mass Index:  40.78 kg/m      Reproductive/Obstetrics negative OB ROS                             Anesthesia Physical Anesthesia Plan  ASA: IV and emergent  Anesthesia Plan: General ETT   Post-op Pain Management:    Induction: Intravenous  PONV Risk Score and Plan: 3 and Ondansetron, Dexamethasone, Midazolam and Treatment may vary due  to age or medical condition  Airway Management Planned: Oral ETT  Additional Equipment:   Intra-op Plan:   Post-operative Plan: Extubation in OR and Possible Post-op intubation/ventilation  Informed Consent: I have reviewed the patients History and Physical, chart, labs and discussed the procedure including the risks, benefits and alternatives for the proposed anesthesia with the patient or authorized representative who has indicated his/her understanding and acceptance.   Dental Advisory Given  Plan Discussed with: Anesthesiologist, CRNA and Surgeon  Anesthesia Plan Comments: (Patient consented for risks of anesthesia including but not limited to:  - adverse reactions to medications - damage to teeth, lips or other oral mucosa - sore throat or hoarseness - Damage to heart, brain, lungs or loss of life  Patient voiced understanding.)        Anesthesia Quick Evaluation

## 2016-11-09 NOTE — Progress Notes (Signed)
Changed rectal dsg again  Bloody dsg  Applied three more abd pads

## 2016-11-09 NOTE — ED Notes (Signed)
Dr. Joni Fears notified in person of pt critical troponin lab value of 0.04.

## 2016-11-09 NOTE — Progress Notes (Signed)
dsg to area near rectum  Saturated     Reapplied 3 abd pads

## 2016-11-09 NOTE — ED Notes (Signed)
BP 46/13 is not correct.

## 2016-11-09 NOTE — Anesthesia Procedure Notes (Signed)
Procedure Name: Intubation Date/Time: 11/09/2016 3:17 PM Performed by: Johnna Acosta Pre-anesthesia Checklist: Patient identified, Emergency Drugs available, Suction available, Patient being monitored and Timeout performed Patient Re-evaluated:Patient Re-evaluated prior to induction Oxygen Delivery Method: Circle system utilized Preoxygenation: Pre-oxygenation with 100% oxygen Induction Type: IV induction, Rapid sequence and Cricoid Pressure applied Ventilation: Mask ventilation without difficulty Laryngoscope Size: McGraph and 4 Grade View: Grade I Tube type: Oral Tube size: 8.0 mm Number of attempts: 1 Airway Equipment and Method: Stylet and Video-laryngoscopy Placement Confirmation: ETT inserted through vocal cords under direct vision,  positive ETCO2 and breath sounds checked- equal and bilateral Tube secured with: Tape Dental Injury: Teeth and Oropharynx as per pre-operative assessment  Difficulty Due To: Difficulty was anticipated, Difficult Airway- due to large tongue and Difficult Airway- due to limited oral opening Future Recommendations: Recommend- induction with short-acting agent, and alternative techniques readily available

## 2016-11-09 NOTE — Op Note (Signed)
Date of procedure: 11/09/16  Preoperative diagnosis:  1. Fournier's gangrene   Postoperative diagnosis:  1. Same   Procedure: 1. Debridement of left inguinal region and perineum  Surgeon: Baruch Gouty, MD  Anesthesia: General  Complications: None  Intraoperative findings: The patient had extensive necrosis along the left inguinal crease as well as down into the peritoneum surrounding the rectum. This left inguinal skin was debridement. Fluid was sent for culture. A Penrose drain was placed anterior to the rectum for drainage.  EBL: None   SpecimenWound culture to lab  Drains: 64 French Foley catheter, 1 inch Penrose drain anterior to the rectum  Disposition: Stable to the postanesthesia care unit  Indication for procedure: The patient is a 56 y.o. male wihistory of diabetes presents today with Fournier's gangrene. He had free air in his left inguinal region into his perineum and surrounding his rectum. He presents today for emergent debridement .  After reviewing the management options for treatment, the patient elected to proceed with the above surgical procedure(s). We have discussed the potential benefits and risks of the procedure, side effects of the proposed treatment, the likelihood of the patient achieving the goals of the procedure, and any potential problems that might occur during the procedure or recuperation. Informed consent has been obtained.  Description of procedure: The patient was met in the preoperative area. All risks, benefits, and indications of the procedure were described in great detail. The patient consented to the procedure. Preoperative antibiotics were given. The patient was taken to the operative theater. General anesthesia was induced per the anesthesia service. The patient was then placed in the dorsal lithotomy position and prepped and draped in the usual sterile fashion. A preoperative timeout was called.    24 French Foley catheter was placed per  urethra to dependent drainage. The inguinal crease was then incised with Bovie electrocautery. Subcutaneous sedative dissection took place until the necrotic tissue from the Fournier's gangrene was encountered. The dead skin around the left renal region was excised completely. The wound was cultured and sent to the lab for aerobic anaerobic studies. The wound was then probed until all necrotic loculations were broken up. The incision was then carried out into the anterior portion of the perineum. It was stopped at this time and Gen. surgery was called in for consultation. He recommended placing a Penrose drain through the incision and into the contralateral side into the rectum. This was done and the drain secured with a nylon suture. The wound was probed and all loculations in a car tissue was opened until there is no further necrotic tissue encountered. Hemostasis was obtained. Wound was then packed with saline Curlex. It was covered ABDs. The patient was awoken from anesthesia and transferred in stable condition to postanesthesia care unit.  Plan: The patient will be admitted to the hospice overnight. He will continue broad-spectrum antibiotics at this time. We'll plan to transfer the patient to [facility] for definitive care as the bili perform frequent dressing changes and plastic surgery are not available at this institution. A call has been placed to again they transfer process to [facility]. Hopefully the patient will be stable for transfer tomorrow.  Baruch Gouty, M.D.

## 2016-11-09 NOTE — Progress Notes (Signed)
Spoke with [employer] and on call [place] agrees that transfer is reasonable. However, unclear if transfer will be to Urology vs General Surgery vs Plastic Surgery.   I plan to reassess patient Friday morning and will call back [facility] to finalize a plan for transfer and determine which [facility] service will accept patient.

## 2016-11-09 NOTE — Transfer of Care (Signed)
Immediate Anesthesia Transfer of Care Note  Patient: Howard Young  Procedure(s) Performed: Procedure(s): SCROTUM EXPLORATION (N/A)  Patient Location: PACU  Anesthesia Type:General  Level of Consciousness: awake  Airway & Oxygen Therapy: Patient Spontanous Breathing and Patient connected to face mask oxygen  Post-op Assessment: Report given to RN and Post -op Vital signs reviewed and stable  Post vital signs: Reviewed and stable  Last Vitals:  Vitals:   11/09/16 1430 11/09/16 1623  BP: (!) 167/143 132/76  Pulse: (!) 107 (!) 119  Resp: (!) 28 (!) 26  Temp:  37.3 C  SpO2: 97% 99%    Last Pain:  Vitals:   11/09/16 1623  TempSrc: Temporal  PainSc:          Complications: No apparent anesthesia complications

## 2016-11-09 NOTE — ED Notes (Signed)
Belongings sent to OR with pt.

## 2016-11-09 NOTE — Anesthesia Post-op Follow-up Note (Signed)
Anesthesia QCDR form completed.        

## 2016-11-10 ENCOUNTER — Encounter: Payer: Self-pay | Admitting: Urology

## 2016-11-10 DIAGNOSIS — N493 Fournier gangrene: Secondary | ICD-10-CM

## 2016-11-10 LAB — GLUCOSE, CAPILLARY
GLUCOSE-CAPILLARY: 256 mg/dL — AB (ref 65–99)
Glucose-Capillary: 281 mg/dL — ABNORMAL HIGH (ref 65–99)
Glucose-Capillary: 324 mg/dL — ABNORMAL HIGH (ref 65–99)

## 2016-11-10 LAB — BASIC METABOLIC PANEL
Anion gap: 6 (ref 5–15)
BUN: 39 mg/dL — AB (ref 6–20)
CO2: 24 mmol/L (ref 22–32)
Calcium: 7.8 mg/dL — ABNORMAL LOW (ref 8.9–10.3)
Chloride: 104 mmol/L (ref 101–111)
Creatinine, Ser: 1.59 mg/dL — ABNORMAL HIGH (ref 0.61–1.24)
GFR calc Af Amer: 54 mL/min — ABNORMAL LOW (ref >60)
GFR, EST NON AFRICAN AMERICAN: 47 mL/min — AB (ref >60)
GLUCOSE: 286 mg/dL — AB (ref 65–99)
Potassium: 3.9 mmol/L (ref 3.5–5.1)
Sodium: 134 mmol/L — ABNORMAL LOW (ref 135–145)

## 2016-11-10 LAB — CBC
HEMATOCRIT: 33.5 % — AB (ref 40.0–52.0)
Hemoglobin: 11.7 g/dL — ABNORMAL LOW (ref 13.0–18.0)
MCH: 29.6 pg (ref 26.0–34.0)
MCHC: 35 g/dL (ref 32.0–36.0)
MCV: 84.6 fL (ref 80.0–100.0)
Platelets: 184 10*3/uL (ref 150–440)
RBC: 3.96 MIL/uL — ABNORMAL LOW (ref 4.40–5.90)
RDW: 13.3 % (ref 11.5–14.5)
WBC: 25.6 10*3/uL — ABNORMAL HIGH (ref 3.8–10.6)

## 2016-11-10 LAB — TROPONIN I

## 2016-11-10 MED ORDER — VANCOMYCIN HCL 10 G IV SOLR: 1500.0000 mg | Freq: Two times a day (BID) | INTRAVENOUS | 0 refills | Status: DC

## 2016-11-10 MED ORDER — INSULIN GLARGINE 100 UNIT/ML ~~LOC~~ SOLN
20.0000 [IU] | Freq: Every day | SUBCUTANEOUS | Status: DC
  Administered 2016-11-10: 20 [IU] via SUBCUTANEOUS
  Filled 2016-11-10 (×2): qty 0.2

## 2016-11-10 MED ORDER — MORPHINE SULFATE (PF) 4 MG/ML IV SOLN: 4.0000 mg | INTRAVENOUS | 0 refills | Status: DC | PRN

## 2016-11-10 MED ORDER — INSULIN GLARGINE 100 UNIT/ML ~~LOC~~ SOLN: 20.0000 [IU] | Freq: Every day | SUBCUTANEOUS | 11 refills | Status: DC

## 2016-11-10 MED ORDER — PIPERACILLIN-TAZOBACTAM 3.375 G IVPB: 3.3750 g | Freq: Three times a day (TID) | INTRAVENOUS | 0 refills | Status: DC

## 2016-11-10 NOTE — Progress Notes (Signed)
Inpatient Diabetes Program Recommendations  AACE/ADA: New Consensus Statement on Inpatient Glycemic Control (2015)  Target Ranges:  Prepandial:   less than 140 mg/dL      Peak postprandial:   less than 180 mg/dL (1-2 hours)      Critically ill patients:  140 - 180 mg/dL   Results for Howard Young, Howard Young (MRN [id number]) as of 11/10/2016 09:32  Ref. Range 11/09/2016 09:37 11/09/2016 14:54 11/09/2016 16:27 11/09/2016 21:02 11/10/2016 07:27  Glucose-Capillary Latest Ref Range: 65 - 99 mg/dL 278 (H) 288 (H) 301 (H) 280 (H) 281 (H)    Admit with: Fournier's gangrene  History: DM  Home DM Meds: Basaglar Insulin 60 units QHS       Victoza 1.8 mg daily       Metformin 1000 mg BID  Current Insulin Orders: Lantus 20 units daily      Novolog Sensitive Correction Scale/ SSI (0-9 units) TID AC + HS       MD- Note Lantus 20 units daily to start this AM.  CBGs >250 mg/dl.  Please consider the following adjustments:  1. Increase Lantus to 30 units daily (50% total home dose)  2. Increase Novolog SSI to Moderate scale (0-15 units) TID AC + HS      --Will follow patient during hospitalization--  Wyn Quaker RN, MSN, CDE Diabetes Coordinator Inpatient Glycemic Control Team Team Pager: [phone] (8a-5p)

## 2016-11-10 NOTE — Progress Notes (Signed)
Pt transferred to [facility] via [place] transport. Report was called and given to receiving nurse and to transport team.  Packet with pt chart and imaging was given to transport personal at arrival.

## 2016-11-10 NOTE — Discharge Summary (Signed)
[facility] [place] NAME: Howard Young    MR#:  [id number]  DATE OF BIRTH:  05/17/1960  DATE OF ADMISSION:  11/09/2016 ADMITTING PHYSICIAN: Nickie Retort, MD  DATE OF DISCHARGE: 11/10/2016   PRIMARY CARE PHYSICIAN: Birdie Sons, MD    ADMISSION DIAGNOSIS:  Fournier's gangrene in male [N49.3] Sepsis, due to unspecified organism [facility]) [A41.9]  DISCHARGE DIAGNOSIS:  Active Problems:   Fournier gangrene   SECONDARY DIAGNOSIS:   Past Medical History:  Diagnosis Date  . Diabetes mellitus without complication ([place])   . GERD (gastroesophageal reflux disease)   . Hypertension   . Morbid obesity ([place])   . Sleep apnea     HOSPITAL COURSE:   1. Sepsis-patient meets criteria given his leukocytosis, tachycardia and CT scan abdomen pelvis findings suggestive of Fournier's gangrene which is likely the source.  -continue supportive care with IV fluids, broad-spectrum IV antibiotics with vancomycin, Zosyn. Follow hemodynamics, follow blood, wound cultures. Patient is going to the OR for incision and drainage and debridement today.   Urologist did the surgery, and spoke to surgeons at [facility], pt is accepted for further care there.  2. 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 Continue broad-spectrum IV antibiotics with vancomycin, Zosyn. -Urology has been consulted, patient go to the OR today. Continue further care as per urology.  3. Acute kidney injury-secondary to sepsis-likely ATN. Next-we'll hydrate the patient with IV fluids, follow BUN and creatinine, urine output. Renal dose meds, avoid nephrotoxins.  4. Diabetes type 2 without complication-hold patient's metformin, scheduled insulin for now. Place patient on sliding scale insulin.   Started on low doe lantus, as he is hyperglycemic.  5. Leukocytosis-secondary to #2. Continue IV antibiotics, follow white cell count.  6. Essential  hypertension-continue Toprol  7. Hyperlipidemia-continue atorvastatin.  8. Diabetic neuropathy-continue gabapentin.  DISCHARGE CONDITIONS:   Stable.  CONSULTS OBTAINED:  Treatment Team:  Nickie Retort, MD  DRUG ALLERGIES:   Allergies  Allergen Reactions  . Metoclopramide Hcl Shortness Of Breath and Anxiety  . Glipizide     Profound weigh gain    DISCHARGE MEDICATIONS:   Current Discharge Medication List    START taking these medications   Details  insulin glargine (LANTUS) 100 UNIT/ML injection Inject 0.2 mLs (20 Units total) into the skin daily. Qty: 10 mL, Refills: 11    morphine 4 MG/ML injection Inject 1 mL (4 mg total) into the vein every 2 (two) hours as needed for severe pain. Qty: 10 mL, Refills: 0    piperacillin-tazobactam (ZOSYN) 3.375 GM/50ML IVPB Inject 50 mLs (3.375 g total) into the vein every 8 (eight) hours. Qty: 50 mL, Refills: 0    vancomycin 1,500 mg in sodium chloride 0.9 % 500 mL Inject 1,500 mg into the vein every 12 (twelve) hours. Qty: 1000 mL, Refills: 0      CONTINUE these medications which have NOT CHANGED   Details  aspirin 81 MG tablet Take 81 mg by mouth daily.    atorvastatin (LIPITOR) 20 MG tablet Take 1 tablet (20 mg total) by mouth daily. Qty: 90 tablet, Refills: 3    gabapentin (NEURONTIN) 100 MG capsule Take 1 capsule (100 mg total) by mouth 3 (three) times daily. Qty: 180 capsule, Refills: 0   Associated Diagnoses: Essential hypertension, benign    metoprolol succinate (TOPROL-XL) 50 MG 24 hr tablet Take 1 tablet (50 mg total) by mouth daily. Take with or immediately following a meal. Qty: 90  tablet, Refills: 3    Multiple Vitamins-Minerals (MULTIVITAMIN PO) Take 1 tablet by mouth daily.     nitroGLYCERIN (NITROSTAT) 0.4 MG SL tablet Place 1 tablet (0.4 mg total) under the tongue every 5 (five) minutes as needed for chest pain. Qty: 20 tablet, Refills: 0    pantoprazole (PROTONIX) 40 MG tablet Take 1 tablet (40  mg total) by mouth daily. Qty: 90 tablet, Refills: 3    ALPRAZolam (XANAX) 1 MG tablet Take 1/2 to 1 two times a day as needed. Qty: 20 tablet, Refills: 0   Associated Diagnoses: Anxiety      STOP taking these medications     Ginkgo Biloba 40 MG TABS      glucosamine-chondroitin 500-400 MG tablet      glucose blood (ONE TOUCH ULTRA TEST) test strip      Insulin Glargine (BASAGLAR KWIKPEN) 100 UNIT/ML SOPN      Insulin Pen Needle (BD PEN NEEDLE NANO U/F) 32G X 4 MM MISC      Insulin Pen Needle 29G X 5MM MISC      liraglutide (VICTOZA) 18 MG/3ML SOPN      lisinopril-hydrochlorothiazide (PRINZIDE,ZESTORETIC) 20-12.5 MG tablet      metFORMIN (GLUCOPHAGE) 500 MG tablet      modafinil (PROVIGIL) 200 MG tablet      naftifine (NAFTIN) 1 % cream      spironolactone (ALDACTONE) 50 MG tablet      ST JOHNS WORT EXTRACT PO      ONE TOUCH ULTRA TEST test strip          DISCHARGE INSTRUCTIONS:    Follow as advised by [facility] on d/c.  If you experience worsening of your admission symptoms, develop shortness of breath, life threatening emergency, suicidal or homicidal thoughts you must seek medical attention immediately by calling 911 or calling your MD immediately  if symptoms less severe.  You Must read complete instructions/literature along with all the possible adverse reactions/side effects for all the Medicines you take and that have been prescribed to you. Take any new Medicines after you have completely understood and accept all the possible adverse reactions/side effects.   Please note  You were cared for by a hospitalist during your hospital stay. If you have any questions about your discharge medications or the care you received while you were in the hospital after you are discharged, you can call the unit and asked to speak with the hospitalist on call if the hospitalist that took care of you is not available. Once you are discharged, your primary care physician will  handle any further medical issues. Please note that NO REFILLS for any discharge medications will be authorized once you are discharged, as it is imperative that you return to your primary care physician (or establish a relationship with a primary care physician if you do not have one) for your aftercare needs so that they can reassess your need for medications and monitor your lab values.    Today   CHIEF COMPLAINT:   Chief Complaint  Patient presents with  . Shortness of Breath    HISTORY OF PRESENT ILLNESS:  Howard Young  is a 56 y.o. male with a known history of Diabetes type 2 without complication, GERD, hypertension, morbid obesity, obstructive sleep apnea who presents to the hospital due to shortness of breath and just not feeling well. Patient also says that he's been having pain in his groin area and his perineal area now for the past 4-5 days. He  admits to some chills but no documented fever. Since he was not improving he came to the ER for further evaluation. Given her shortness of breath he underwent a CT scan of his chest with contrast which was negative for pulmonary embolism. He was noted to be tachycardic, noted to have a leukocytosis and suspected to have sepsis and therefore underwent a CT scan of his abdomen pelvis given his redness and pain in his scrotal/perineal area. The CT scan of the abdomen and pelvis was suggestive of Fournier's gangrene and therefore patient was emergently rushed to the operating room. Hospitalist services were contacted further treatment and evaluation.   VITAL SIGNS:  Blood pressure (!) 111/59, pulse 78, temperature (!) 97.2 F (36.2 C), resp. rate 18, height 6\' 4"  (1.93 m), weight (!) 152 kg (335 lb), SpO2 94 %.  I/O:   Intake/Output Summary (Last 24 hours) at 11/10/16 1508 Last data filed at 11/10/16 1007  Gross per 24 hour  Intake             4047 ml  Output             1275 ml  Net             2772 ml    PHYSICAL EXAMINATION:    GENERAL:  56 y.o.-year-old obese patient lying in the bed in no acute distress.  EYES: Pupils equal, round, reactive to light and accommodation. No scleral icterus. Extraocular muscles intact.  HEENT: Head atraumatic, normocephalic. Oropharynx and nasopharynx clear. No oropharyngeal erythema, moist oral mucosa  NECK:  Supple, no jugular venous distention. No thyroid enlargement, no tenderness.  LUNGS: Normal breath sounds bilaterally, no wheezing, rales, rhonchi. No use of accessory muscles of respiration.  CARDIOVASCULAR: S1, S2 RRR. No murmurs, rubs, gallops, clicks.  ABDOMEN: Soft, nontender, nondistended. Bowel sounds present. No organomegaly or mass.  EXTREMITIES: No pedal edema, cyanosis, or clubbing. + 2 pedal & radial pulses b/l.   NEUROLOGIC: Cranial nerves II through XII are intact. No focal Motor or sensory deficits appreciated b/l PSYCHIATRIC: The patient is alert and oriented x 3. Good affect.  SKIN: No obvious rash, lesion, or ulcer.   GU:  post surgical dressing present on groin area.   DATA REVIEW:   CBC  Recent Labs Lab 11/10/16 0433  WBC 25.6*  HGB 11.7*  HCT 33.5*  PLT 184    Chemistries   Recent Labs Lab 11/09/16 0941 11/10/16 0433  NA 132* 134*  K 4.0 3.9  CL 96* 104  CO2 25 24  GLUCOSE 299* 286*  BUN 41* 39*  CREATININE 2.05* 1.59*  CALCIUM 9.0 7.8*  AST 44*  --   ALT 43  --   ALKPHOS 69  --   BILITOT 3.6*  --     Cardiac Enzymes  Recent Labs Lab 11/10/16 0902  TROPONINI <0.03    Microbiology Results  Results for orders placed or performed during the hospital encounter of 11/09/16  Culture, blood (routine x 2)     Status: None (Preliminary result)   Collection Time: 11/09/16 11:17 AM  Result Value Ref Range Status   Specimen Description BLOOD LEFT ANTECUBITAL  Final   Special Requests   Final    BOTTLES DRAWN AEROBIC AND ANAEROBIC Blood Culture adequate volume   Culture NO GROWTH < 24 HOURS  Final   Report Status PENDING   Incomplete  Culture, blood (routine x 2)     Status: None (Preliminary result)   Collection Time: 11/09/16 11:18  AM  Result Value Ref Range Status   Specimen Description BLOOD RIGHT ANTECUBITAL  Final   Special Requests   Final    BOTTLES DRAWN AEROBIC AND ANAEROBIC Blood Culture results may not be optimal due to an excessive volume of blood received in culture bottles   Culture NO GROWTH < 24 HOURS  Final   Report Status PENDING  Incomplete  Aerobic/Anaerobic Culture (surgical/deep wound)     Status: None (Preliminary result)   Collection Time: 11/09/16  3:42 PM  Result Value Ref Range Status   Specimen Description WOUND  Final   Special Requests NONE  Final   Gram Stain   Final    FEW WBC PRESENT, PREDOMINANTLY PMN FEW GRAM POSITIVE COCCI IN PAIRS FEW GRAM NEGATIVE RODS    Culture   Final    NO GROWTH < 24 HOURS Performed at [facility], [address] [place], [place] [address]    Report Status PENDING  Incomplete    RADIOLOGY:  Ct Abdomen Pelvis Wo Contrast  Result Date: 11/09/2016 CLINICAL DATA:  Rectal pain. EXAM: CT ABDOMEN AND PELVIS WITHOUT CONTRAST TECHNIQUE: Multidetector CT imaging of the abdomen and pelvis was performed following the standard protocol without IV contrast. COMPARISON:  None. FINDINGS: Lower chest: No acute abnormality. Hepatobiliary: Status post cholecystectomy. Fatty infiltration of the liver is noted. Pancreas: Minimal inflammatory changes are noted around the pancreatic head, and clinical correlation is recommended evaluate for possible acute pancreatitis. Spleen: Normal in size without focal abnormality. Adrenals/Urinary Tract: Adrenal glands appear normal. No hydronephrosis or renal obstruction is noted. There is noted contrast in both intrarenal collecting systems, ureters and urinary bladder, from previous contrast injection. Left kidney appears normal. 3 cm exophytic simple cyst is seen arising anteriorly from right kidney. Small cyst is seen  in upper pole. 12 mm hyperdense focus is noted in midpole of right kidney most consistent with hyperdense cyst. Stomach/Bowel: Status post lap band procedure involving proximal stomach. There is no evidence of bowel obstruction or inflammation. The appendix appears normal. Vascular/Lymphatic: Aortic atherosclerosis. No enlarged abdominal or pelvic lymph nodes. Reproductive: Prostate is unremarkable. Other: There is a large amount of soft tissue gas in the left inguinal region which extends into the left perineal region concerning for Fournier's gangrene. A gas is seen to extend posteriorly near the anus. No definite abscess is noted. No definite hernia is noted. Musculoskeletal: Status post right hip arthroplasty. No acute osseous abnormality is noted. IMPRESSION: Large amount of soft tissue gas seen in the left inguinal, perineal and perianal regions concerning for Fournier's gangrene. These results were called by telephone at the time of interpretation on 11/09/2016 at 1:58 pm to Dr. Carrie Mew , who verbally acknowledged these results. Minimal stranding is noted around the pancreatic head, with may represent pancreatitis. Clinical hour tori correlation is recommended. Fatty infiltration of the liver. Aortic atherosclerosis. Several right renal cysts are noted. 12 mm hyperdense focus is noted in midpole of right kidney which may represent hyperdense cyst, but ultrasound is recommended for confirmation and to rule out mass. Electronically Signed   By: Marijo Conception, M.D.   On: 11/09/2016 13:59   Ct Angio Chest Pe W And/or Wo Contrast  Result Date: 11/09/2016 CLINICAL DATA:  Shortness of Breath EXAM: CT ANGIOGRAPHY CHEST WITH CONTRAST TECHNIQUE: Multidetector CT imaging of the chest was performed using the standard protocol during bolus administration of intravenous contrast. Multiplanar CT image reconstructions and MIPs were obtained to evaluate the vascular anatomy.  CONTRAST:  75 mL Isovue 370 nonionic  COMPARISON:  Chest radiograph and chest CT October 09, 2016 FINDINGS: Cardiovascular: There is no demonstrable pulmonary embolus. There is no thoracic aortic aneurysm or dissection. The visualized great vessels appear unremarkable. Pericardium is not appreciably thickened. The main pulmonary outflow tract measures 3.3 cm in diameter, a finding suggesting underlying pulmonary arterial hypertension. There is a slight degree of coronary artery calcification. Mediastinum/Nodes: There are benign-appearing calcifications in the anterior right lobe of the thyroid, stable. No well-defined thyroid mass evident. There are scattered calcified sub- carinal lymph nodes. No adenopathy by size criteria is evident on this study. No esophageal lesions are appreciable. Lungs/Pleura: There is no edema or consolidation. No pleural effusion or pleural thickening. Upper Abdomen: There is hepatic steatosis. Gallbladder is absent. A lap band is in position at the gastric cardia. Musculoskeletal: There are no blastic or lytic bone lesions. Review of the MIP images confirms the above findings. IMPRESSION: 1.  No demonstrable pulmonary embolus. 2. Prominence of the main pulmonary outflow tract. Suspect a degree of pulmonary arterial hypertension. 3.  Modest coronary artery calcification. 4.  No lung edema or consolidation.  No pleural effusion. 5. No adenopathy by size criteria. Several calcified sub- carinal lymph nodes, indicative of prior granulomatous disease. 6.  Hepatic steatosis. 7.  Gallbladder absent. 8.  Lap band positioned at gastric cardia. Electronically Signed   By: Lowella Grip III M.D.   On: 11/09/2016 12:21    EKG:   Orders placed or performed during the hospital encounter of 11/09/16  . EKG 12-Lead  . EKG 12-Lead  . EKG 12-Lead  . EKG 12-Lead      Management plans discussed with the patient, family and they are in agreement.  CODE STATUS:     Code Status Orders        Start     Ordered   11/09/16  1813  Full code  Continuous     11/09/16 1812    Code Status History    Date Active Date Inactive Code Status Order ID Comments User Context   This patient has a current code status but no historical code status.      TOTAL TIME TAKING CARE OF THIS PATIENT: 35 minutes.    Vaughan Basta M.D on 11/10/2016 at 3:08 PM  Between 7am to 6pm - Pager - [phone]  After 6pm go to [email or web address] - password [email or web address] [facility]  Office  [phone]  CC: Primary care physician; Birdie Sons, MD   Note: This dictation was prepared with Dragon dictation along with smaller phrase technology. Any transcriptional errors that result from this process are unintentional.

## 2016-11-10 NOTE — Progress Notes (Signed)
Upon entering pt room to assess, verbalizes dull chest pain on the left side. Vital signed checked and were noted to be stable. See flow sheet. MD immediatly notified. Orders placed for stat EKG, troponin, and tele. Will continuie to monitor.

## 2016-11-10 NOTE — Progress Notes (Signed)
No events overnight Pain improved No n/v/f/c Foley uncomfortable  Vitals:   11/09/16 1858 11/09/16 2044 11/10/16 0602 11/10/16 0800  BP: (!) 103/49 (!) 103/49 118/62 118/79  Pulse: 99 94 90   Resp:  20 18   Temp: 98.8 F (37.1 C) 99.1 F (37.3 C) 98.2 F (36.8 C)   TempSrc: Oral Oral Oral   SpO2: 96% 97% 94%   Weight:      Height:       I/O last 3 completed shifts: In: 7482 [P.O.:40; I.V.:2951; IV Piggyback:2650] Out: 7078 [Urine:1250; Blood:25] No intake/output data recorded.   NAD Soft NT ND Foley clear Left inguinal/perinal debridement zone healthy. No crepitus. No progression of fournier's. Packing and dressings in place  CBC    Component Value Date/Time   WBC 25.6 (H) 11/10/2016 0433   RBC 3.96 (L) 11/10/2016 0433   HGB 11.7 (L) 11/10/2016 0433   HGB 16.1 06/12/2014 1204   HCT 33.5 (L) 11/10/2016 0433   HCT 47.8 06/12/2014 1204   PLT 184 11/10/2016 0433   PLT 211 06/12/2014 1204   MCV 84.6 11/10/2016 0433   MCV 84 06/12/2014 1204   MCH 29.6 11/10/2016 0433   MCHC 35.0 11/10/2016 0433   RDW 13.3 11/10/2016 0433   RDW 13.0 06/12/2014 1204   LYMPHSABS 1.3 11/09/2016 0941   LYMPHSABS 2.0 06/12/2014 1204   MONOABS 3.2 (H) 11/09/2016 0941   MONOABS 0.8 06/12/2014 1204   EOSABS 0.0 11/09/2016 0941   EOSABS 0.1 06/12/2014 1204   BASOSABS 0.0 11/09/2016 0941   BASOSABS 0.1 06/12/2014 1204   BMP Latest Ref Rng & Units 11/10/2016 11/09/2016 10/09/2016  Glucose 65 - 99 mg/dL 286(H) 299(H) 329(H)  BUN 6 - 20 mg/dL 39(H) 41(H) 24(H)  Creatinine 0.61 - 1.24 mg/dL 1.59(H) 2.05(H) 1.61(H)  Sodium 135 - 145 mmol/L 134(L) 132(L) 135  Potassium 3.5 - 5.1 mmol/L 3.9 4.0 4.6  Chloride 101 - 111 mmol/L 104 96(L) 101  CO2 22 - 32 mmol/L 24 25 24   Calcium 8.9 - 10.3 mg/dL 7.8(L) 9.0 9.7   POD 1 debridement of left inguinal region/perineum for Fournier's gangrene. Doing well -continue broad spectrum abx -continue foley -continue dressing -working on arranging transfer to  [facility] today for definitive management/eventual reconstruction with plastic surgery. If not transferred by tomorrow, will need second look and dressing change Saturday. -will follow

## 2016-11-10 NOTE — Plan of Care (Signed)
Problem: Physical Regulation: Goal: Will remain free from infection Outcome: Progressing Pt on IV vanc and zosyn

## 2016-11-11 LAB — HIV ANTIBODY (ROUTINE TESTING W REFLEX): HIV SCREEN 4TH GENERATION: NONREACTIVE

## 2016-11-14 LAB — CULTURE, BLOOD (ROUTINE X 2)
CULTURE: NO GROWTH
Culture: NO GROWTH
SPECIAL REQUESTS: ADEQUATE

## 2016-11-16 ENCOUNTER — Telehealth: Payer: Self-pay | Admitting: Family Medicine

## 2016-11-16 LAB — AEROBIC/ANAEROBIC CULTURE (SURGICAL/DEEP WOUND)

## 2016-11-16 LAB — AEROBIC/ANAEROBIC CULTURE W GRAM STAIN (SURGICAL/DEEP WOUND)

## 2016-11-16 NOTE — Telephone Encounter (Signed)
Please review-Katrisha Segall V Rue Tinnel, RMA  

## 2016-11-16 NOTE — Telephone Encounter (Signed)
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  Pt was dischraged from [facility] 11/15/16.    Pt also states [facility] has given him Humalog in the hospital.  Pt states he has lost his insurance and asking if we have samples of the Humalog or any kind of coupon card that will help cover the cost.    YP#[phone]/PN

## 2016-11-17 NOTE — Telephone Encounter (Signed)
He can have 2 samples pens of Humalog.

## 2016-11-17 NOTE — Telephone Encounter (Signed)
Pt advised-[name], RMA  

## 2016-11-17 NOTE — Telephone Encounter (Signed)
Samples put aside in the refrigerator for the patient, has a name on them.  LMTCB-[name], RMA

## 2016-11-20 ENCOUNTER — Telehealth: Payer: Self-pay | Admitting: *Deleted

## 2016-11-20 MED ORDER — GABAPENTIN 100 MG PO CAPS: 100.0000 mg | ORAL_CAPSULE | Freq: Three times a day (TID) | ORAL | 5 refills | Status: DC

## 2016-11-20 NOTE — Telephone Encounter (Signed)
Refill request for Gabapentin. Dr. Milinda Pointer states refill +5 additional.

## 2016-11-21 ENCOUNTER — Encounter: Payer: Self-pay | Admitting: Family Medicine

## 2016-11-21 ENCOUNTER — Ambulatory Visit (INDEPENDENT_AMBULATORY_CARE_PROVIDER_SITE_OTHER): Payer: PRIVATE HEALTH INSURANCE | Admitting: Family Medicine

## 2016-11-21 VITALS — BP 126/80 | HR 78 | Temp 97.9°F | Resp 16 | Wt 338.0 lb

## 2016-11-21 DIAGNOSIS — E0840 Diabetes mellitus due to underlying condition with diabetic neuropathy, unspecified: Secondary | ICD-10-CM | POA: Diagnosis not present

## 2016-11-21 DIAGNOSIS — N493 Fournier gangrene: Secondary | ICD-10-CM | POA: Diagnosis not present

## 2016-11-21 NOTE — Progress Notes (Signed)
Patient: Howard Young Male    DOB: August 23, 1960   56 y.o.   MRN: [id number] Visit Date: 11/21/2016  Today's Provider: Lelon Huh, MD   Chief Complaint  Patient presents with  . Hospitalization Follow-up   Subjective:    HPI  Follow up Hospitalization  Patient was admitted to [facility] on 11/10/2016 and was later transferred to [facility] and discharged on 11/15/2016. 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 Treatment for this included Cipro 750mg  BID and Flagyl 500mg  BID. He reports good compliance with treatment. He reports this condition is Unchanged.  Patient reports that he has a home health nurse that comes to his house 3 times a week to change his wound. He also mentions that he has a follow up appt tomorrow with the surgeon at [facility].         Allergies  Allergen Reactions  . Metoclopramide Hcl Shortness Of Breath and Anxiety  . Glipizide     Profound weigh gain     Current Outpatient Prescriptions:  .  aspirin 81 MG tablet, Take 81 mg by mouth daily., Disp: , Rfl:  .  atorvastatin (LIPITOR) 20 MG tablet, Take 1 tablet (20 mg total) by mouth daily., Disp: 90 tablet, Rfl: 3 .  ciprofloxacin (CIPRO) 750 MG tablet, Take 1 tablet by mouth every 12 (twelve) hours., Disp: , Rfl:  .  gabapentin (NEURONTIN) 100 MG capsule, Take 1 capsule (100 mg total) by mouth 3 (three) times daily. (Patient taking differently: Take 100 mg by mouth daily. ), Disp: 180 capsule, Rfl: 0 .  gabapentin (NEURONTIN) 100 MG capsule, Take 1 capsule (100 mg total) by mouth 3 (three) times daily., Disp: 90 capsule, Rfl: 5 .  insulin glargine (LANTUS) 100 UNIT/ML injection, Inject 0.2 mLs (20 Units total) into the skin daily., Disp: 10 mL, Rfl: 11 .  insulin lispro (HUMALOG) 100 UNIT/ML KiwkPen, 20 Units 3 (three) times daily with meals., Disp: , Rfl:  .  lisinopril-hydrochlorothiazide (PRINZIDE,ZESTORETIC) 20-12.5 MG tablet, Take 1 tablet by mouth daily., Disp: , Rfl:    .  metoprolol succinate (TOPROL-XL) 50 MG 24 hr tablet, Take 1 tablet (50 mg total) by mouth daily. Take with or immediately following a meal., Disp: 90 tablet, Rfl: 3 .  metroNIDAZOLE (FLAGYL) 500 MG tablet, Take 1 tablet by mouth every 8 (eight) hours., Disp: , Rfl:  .  Multiple Vitamins-Minerals (MULTIVITAMIN PO), Take 1 tablet by mouth daily. , Disp: , Rfl:  .  oxyCODONE (OXY IR/ROXICODONE) 5 MG immediate release tablet, Take by mouth., Disp: , Rfl:  .  pantoprazole (PROTONIX) 40 MG tablet, Take 1 tablet (40 mg total) by mouth daily., Disp: 90 tablet, Rfl: 3 .  spironolactone (ALDACTONE) 50 MG tablet, Take 1 tablet by mouth daily., Disp: , Rfl:  .  Wound Cleansers (VASHE WOUND THERAPY) SOLN, Irrigate with as directed., Disp: , Rfl:  .  Ginkgo Biloba 100 MG CAPS, Take 1 capsule by mouth daily., Disp: , Rfl:  .  modafinil (PROVIGIL) 200 MG tablet, Take 1 tablet by mouth daily., Disp: , Rfl:  .  nitroGLYCERIN (NITROSTAT) 0.4 MG SL tablet, Place 1 tablet (0.4 mg total) under the tongue every 5 (five) minutes as needed for chest pain., Disp: 20 tablet, Rfl: 0 .  St Johns Wort 1000 MG CAPS, Take 1 capsule by mouth daily., Disp: , Rfl:   Review of Systems  Constitutional: Positive for activity change and fatigue.  Musculoskeletal: Positive  for myalgias.  Skin: Positive for rash and wound.    Social History  Substance Use Topics  . Smoking status: Never Smoker  . Smokeless tobacco: Never Used  . Alcohol use No   Objective:   BP 126/80 (BP Location: Right Arm, Patient Position: Sitting, Cuff Size: Normal)   Pulse 78   Temp 97.9 F (36.6 C)   Resp 16   Wt (!) 338 lb (153.3 kg)   SpO2 98%   BMI 41.14 kg/m  Vitals:   11/21/16 1611  BP: 126/80  Pulse: 78  Resp: 16  Temp: 97.9 F (36.6 C)  SpO2: 98%  Weight: (!) 338 lb (153.3 kg)     Physical Exam   General Appearance:    Alert, cooperative, no distress  Eyes:    PERRL, conjunctiva/corneas clear, EOM's intact       Lungs:      Clear to auscultation bilaterally, respirations unlabored  Heart:    Regular rate and rhythm  Neurologic:   Awake, alert, oriented x 3. No apparent focal neurological           defect.   Skin:     Dressing dry, no surrounding erythema. No significant swelling.        Assessment & Plan:     1. Fournier gangrene Doing well on current antibiotic regiment, follow up surgery at [facility] tomorrow as scheduled.   2. Diabetes mellitus due to underlying condition with diabetic neuropathy, unspecified whether long term insulin use ([place]) Doing well with addition mealtime insulin by endocrinology. Follow up Dr. Manfred Shirts 12-18-16 as scheduled.        Lelon Huh, MD  [facility]

## 2017-02-10 ENCOUNTER — Other Ambulatory Visit: Payer: Self-pay | Admitting: Family Medicine

## 2017-02-10 DIAGNOSIS — I10 Essential (primary) hypertension: Secondary | ICD-10-CM

## 2017-05-07 ENCOUNTER — Other Ambulatory Visit: Payer: Self-pay | Admitting: Family Medicine

## 2017-05-09 NOTE — Telephone Encounter (Signed)
Please advise pharmacy that Dr. Honor Junes manages patients diabetes and they need to send refill request to him. Thanks.

## 2017-06-28 ENCOUNTER — Other Ambulatory Visit: Payer: Self-pay | Admitting: Family Medicine

## 2017-06-28 NOTE — Telephone Encounter (Signed)
Pharmacy requesting refills. Thanks!  

## 2017-07-24 ENCOUNTER — Other Ambulatory Visit: Payer: Self-pay | Admitting: Family Medicine

## 2017-07-26 ENCOUNTER — Other Ambulatory Visit: Payer: Self-pay

## 2017-07-26 MED ORDER — GABAPENTIN 100 MG PO CAPS: 100.0000 mg | ORAL_CAPSULE | Freq: Three times a day (TID) | ORAL | 0 refills | Status: DC

## 2017-07-26 NOTE — Telephone Encounter (Signed)
Pharmacy refill request for Gabapentin.  Per Dr. Milinda Pointer, ok to refill 1 time, needs appt.   Script has been sent to pharmacy

## 2017-08-23 ENCOUNTER — Other Ambulatory Visit: Payer: Self-pay | Admitting: Family Medicine

## 2017-08-31 ENCOUNTER — Other Ambulatory Visit: Payer: Self-pay | Admitting: Podiatry

## 2017-09-21 ENCOUNTER — Other Ambulatory Visit: Payer: Self-pay | Admitting: Podiatry

## 2017-12-20 ENCOUNTER — Ambulatory Visit: Payer: PRIVATE HEALTH INSURANCE | Admitting: Physician Assistant

## 2017-12-20 ENCOUNTER — Encounter: Payer: Self-pay | Admitting: Physician Assistant

## 2017-12-20 VITALS — BP 146/88 | HR 77 | Temp 97.8°F | Wt 310.6 lb

## 2017-12-20 DIAGNOSIS — J029 Acute pharyngitis, unspecified: Secondary | ICD-10-CM

## 2017-12-20 LAB — POCT RAPID STREP A (OFFICE): Rapid Strep A Screen: NEGATIVE

## 2017-12-20 NOTE — Progress Notes (Signed)
Patient: Howard Young Male    DOB: [birth year]   57 y.o.   MRN: [id number] Visit Date: 12/20/2017  Today's Provider: Trinna Post, PA-C   Chief Complaint  Patient presents with  . Sore Throat   Subjective:    HPI  Sore Throat Patient presents today for sore throat, runny nose and cough since 12/19/2017. Patient has taking genetic Tylenol sinus and NyQuil. Denies fevers, SOB.     Allergies  Allergen Reactions  . Metoclopramide Hcl Shortness Of Breath and Anxiety  . Glipizide     Profound weigh gain     Current Outpatient Medications:  .  aspirin 81 MG tablet, Take 81 mg by mouth daily., Disp: , Rfl:  .  atorvastatin (LIPITOR) 20 MG tablet, TAKE 1 TABLET BY MOUTH EVERY DAY, Disp: 90 tablet, Rfl: 4 .  gabapentin (NEURONTIN) 100 MG capsule, Take 1 capsule (100 mg total) by mouth 3 (three) times daily. (Patient taking differently: Take 100 mg by mouth daily. ), Disp: 180 capsule, Rfl: 0 .  gabapentin (NEURONTIN) 100 MG capsule, TAKE 1 CAPSULE BY MOUTH THREE TIMES A DAY, Disp: 60 capsule, Rfl: 0 .  gabapentin (NEURONTIN) 100 MG capsule, TAKE 1 CAPSULE BY MOUTH THREE TIMES A DAY, Disp: 90 capsule, Rfl: 0 .  Ginkgo Biloba 100 MG CAPS, Take 1 capsule by mouth daily., Disp: , Rfl:  .  insulin glargine (LANTUS) 100 UNIT/ML injection, Inject 0.2 mLs (20 Units total) into the skin daily., Disp: 10 mL, Rfl: 11 .  insulin lispro (HUMALOG) 100 UNIT/ML KiwkPen, 20 Units 3 (three) times daily with meals., Disp: , Rfl:  .  lisinopril-hydrochlorothiazide (PRINZIDE,ZESTORETIC) 20-12.5 MG tablet, Take 1 tablet by mouth daily., Disp: , Rfl:  .  lisinopril-hydrochlorothiazide (PRINZIDE,ZESTORETIC) 20-12.5 MG tablet, TAKE 1 TABLET BY MOUTH AT BEDTIME., Disp: 90 tablet, Rfl: 0 .  metoprolol succinate (TOPROL-XL) 50 MG 24 hr tablet, TAKE 1 TABLET (50 MG TOTAL) BY MOUTH DAILY. TAKE WITH OR IMMEDIATELY FOLLOWING A MEAL., Disp: 90 tablet, Rfl: 0 .  modafinil (PROVIGIL) 200 MG tablet, Take 1  tablet by mouth daily., Disp: , Rfl:  .  Multiple Vitamins-Minerals (MULTIVITAMIN PO), Take 1 tablet by mouth daily. , Disp: , Rfl:  .  nitroGLYCERIN (NITROSTAT) 0.4 MG SL tablet, Place 1 tablet (0.4 mg total) under the tongue every 5 (five) minutes as needed for chest pain., Disp: 20 tablet, Rfl: 0 .  oxyCODONE (OXY IR/ROXICODONE) 5 MG immediate release tablet, Take by mouth., Disp: , Rfl:  .  pantoprazole (PROTONIX) 40 MG tablet, Take 1 tablet (40 mg total) by mouth daily., Disp: 90 tablet, Rfl: 3 .  spironolactone (ALDACTONE) 50 MG tablet, Take 1 tablet by mouth daily., Disp: , Rfl:  .  St Johns Wort 1000 MG CAPS, Take 1 capsule by mouth daily., Disp: , Rfl:  .  Wound Cleansers (VASHE WOUND THERAPY) SOLN, Irrigate with as directed., Disp: , Rfl:   Review of Systems  Constitutional: Negative.   HENT: Positive for sore throat.   Respiratory: Positive for cough.   Gastrointestinal: Negative.   Allergic/Immunologic: Negative.   Neurological: Positive for headaches.    Social History   Tobacco Use  . Smoking status: Never Smoker  . Smokeless tobacco: Never Used  Substance Use Topics  . Alcohol use: No   Objective:   BP (!) 146/88 (BP Location: Right Arm, Patient Position: Sitting, Cuff Size: Normal)   Pulse 77   Temp 97.8 F (36.6 C) (Oral)  Wt (!) 310 lb 9.6 oz (140.9 kg)   SpO2 97%   BMI 37.81 kg/m  Vitals:   12/20/17 1429  BP: (!) 146/88  Pulse: 77  Temp: 97.8 F (36.6 C)  TempSrc: Oral  SpO2: 97%  Weight: (!) 310 lb 9.6 oz (140.9 kg)     Physical Exam  Constitutional: He is oriented to person, place, and time. He appears well-developed and well-nourished. He does not appear ill.  HENT:  Right Ear: Tympanic membrane and ear canal normal.  Left Ear: Tympanic membrane and ear canal normal.  Mouth/Throat: Uvula is midline and mucous membranes are normal. Posterior oropharyngeal erythema present. No oropharyngeal exudate or posterior oropharyngeal edema.  Neck: Neck  supple.  Cardiovascular: Normal rate, regular rhythm and normal heart sounds.  Pulmonary/Chest: Effort normal and breath sounds normal.  Lymphadenopathy:    He has no cervical adenopathy.  Neurological: He is alert and oriented to person, place, and time.  Psychiatric: He has a normal mood and affect. His behavior is normal.        Assessment & Plan:     1. Sorethroat  Counseled regarding signs and symptoms of viral and bacterial respiratory infections. Advised to call or return for additional evaluation if he develops any sign of bacterial infection, or if current symptoms last longer than 10 days.   - POCT rapid strep A        Trinna Post, PA-C  [facility]

## 2017-12-20 NOTE — Patient Instructions (Signed)

## 2018-02-04 ENCOUNTER — Ambulatory Visit (INDEPENDENT_AMBULATORY_CARE_PROVIDER_SITE_OTHER): Payer: PRIVATE HEALTH INSURANCE | Admitting: Family Medicine

## 2018-02-04 ENCOUNTER — Encounter: Payer: Self-pay | Admitting: Family Medicine

## 2018-02-04 ENCOUNTER — Other Ambulatory Visit
Admission: RE | Admit: 2018-02-04 | Discharge: 2018-02-04 | Disposition: A | Discharge status: Home / Self Care | Payer: No Typology Code available for payment source | Source: Ambulatory Visit | Attending: Family Medicine | Admitting: Family Medicine

## 2018-02-04 VITALS — BP 200/120 | HR 80 | Temp 97.6°F | Resp 16 | Ht 76.0 in | Wt 295.0 lb

## 2018-02-04 DIAGNOSIS — I10 Essential (primary) hypertension: Secondary | ICD-10-CM

## 2018-02-04 DIAGNOSIS — Z125 Encounter for screening for malignant neoplasm of prostate: Secondary | ICD-10-CM | POA: Insufficient documentation

## 2018-02-04 DIAGNOSIS — Z23 Encounter for immunization: Secondary | ICD-10-CM

## 2018-02-04 DIAGNOSIS — E8881 Metabolic syndrome: Secondary | ICD-10-CM | POA: Diagnosis not present

## 2018-02-04 DIAGNOSIS — Z0000 Encounter for general adult medical examination without abnormal findings: Secondary | ICD-10-CM

## 2018-02-04 DIAGNOSIS — G4730 Sleep apnea, unspecified: Secondary | ICD-10-CM

## 2018-02-04 DIAGNOSIS — R3911 Hesitancy of micturition: Secondary | ICD-10-CM

## 2018-02-04 LAB — COMPREHENSIVE METABOLIC PANEL
ALT: 40 U/L (ref 0–44)
AST: 49 U/L — ABNORMAL HIGH (ref 15–41)
Albumin: 4.1 g/dL (ref 3.5–5.0)
Alkaline Phosphatase: 43 U/L (ref 38–126)
Anion gap: 8 (ref 5–15)
BUN: 19 mg/dL (ref 6–20)
CO2: 27 mmol/L (ref 22–32)
CREATININE: 1.23 mg/dL (ref 0.61–1.24)
Calcium: 9.3 mg/dL (ref 8.9–10.3)
Chloride: 101 mmol/L (ref 98–111)
GFR calc non Af Amer: >60 mL/min (ref >60)
Glucose, Bld: 113 mg/dL — ABNORMAL HIGH (ref 70–99)
Potassium: 3.6 mmol/L (ref 3.5–5.1)
Sodium: 136 mmol/L (ref 135–145)
Total Bilirubin: 1.7 mg/dL — ABNORMAL HIGH (ref 0.3–1.2)
Total Protein: 8 g/dL (ref 6.5–8.1)

## 2018-02-04 LAB — LIPID PANEL
Cholesterol: 165 mg/dL (ref 0–200)
HDL: 58 mg/dL (ref >40)
LDL Cholesterol: 88 mg/dL (ref 0–99)
TRIGLYCERIDES: 97 mg/dL (ref <150)
Total CHOL/HDL Ratio: 2.8 RATIO
VLDL: 19 mg/dL (ref 0–40)

## 2018-02-04 LAB — PSA: Prostatic Specific Antigen: 1.44 ng/mL (ref 0.00–4.00)

## 2018-02-04 MED ORDER — LISINOPRIL-HYDROCHLOROTHIAZIDE 20-12.5 MG PO TABS: 0.5000 | ORAL_TABLET | Freq: Every day | ORAL | 0 refills | Status: DC

## 2018-02-04 NOTE — Patient Instructions (Addendum)
.   PLEASE BRING ALL OF YOUR MEDICATIONS TO EVERY APPOINTMENT TO MAKE SURE OUR MEDICATION LIST IS THE SAME AS YOURS  . It is recommended to engage in 150 minutes of moderate exercise every week.    Never stop your blood pressure medications without notifying your physician. Doing so can lead to rebound hypertension and cause heart attacks and strokes.    The CDC recommends two doses of Shingrix (the shingles vaccine) separated by 2 to 6 months for adults age 3 years and older. I recommend checking with your insurance plan regarding coverage for this vaccine.   . Please contact your eyecare professional to schedule a routine eye exam

## 2018-02-04 NOTE — Progress Notes (Signed)
Patient: Howard Young, Male    DOB: [birth year], 57 y.o.   MRN: [id number] Visit Date: 02/04/2018  Today's Provider: Lelon Huh, MD   Chief Complaint  Patient presents with  . Annual Exam  . Hypertension   Subjective:    Annual physical exam Howard Young is a 57 y.o. male who presents today for health maintenance and complete physical. He feels fairly well. He reports no regular exercising. He reports he is sleeping fairly well.  -----------------------------------------------------------------  Hypertension, follow-up:  BP Readings from Last 3 Encounters:  02/04/18 (!) 200/120  12/20/17 (!) 146/88  11/21/16 126/80    States his blood pressure was running, had been taking meds every other day for awhile, but stopped one week due to low bp around 100/80.  Management since that visit includes ordering labs (patient did not complete labs). He reports poor compliance with treatment. Patient stopped taking his blood pressure medications.  States he initially started metoprolol due to palpitations, which he is no longer having.   He does report he has lost around 40 pounds with intermittent fasting.   He is having side effects. Patient reports his blood pressure has been running too low when taking his blood pressure medications, which causes him to become dizzy when changing positions. He is not exercising. He is adherent to low salt diet.   Outside blood pressures are 111/64. He is experiencing none.  Patient denies chest pain, chest pressure/discomfort, claudication, dyspnea, exertional chest pressure/discomfort, fatigue, irregular heart beat, lower extremity edema, near-syncope, orthopnea, palpitations, paroxysmal nocturnal dyspnea, syncope and tachypnea.   Cardiovascular risk factors include advanced age (older than 68 for men, 84 for women), hypertension and male gender.  Use of agents associated with hypertension: none.     Weight trend: decreasing  steadily Wt Readings from Last 3 Encounters:  02/04/18 295 lb (133.8 kg)  12/20/17 (!) 310 lb 9.6 oz (140.9 kg)  11/21/16 (!) 338 lb (153.3 kg)    Current diet: well balanced  ------------------------------------------------------------------------ Follow up of Sleep Apnea: Patient was last seen for this problem 1 year ago and no changes were made. Patient reports good compliance with CPAP usage.   Diabetes: Follow by Endocrinology. Dr. Honor Junes. Patient reports good compliance with treatment.   Urinary hesitancy.  He states that he wakes up frequently the first half of his sleeping shift despite wearing CPAP every night. State that he often is unable to completely empty his bladder and has to void again. No burning or stinging with urination.   Review of Systems  Constitutional: Negative for appetite change, chills, fatigue and fever.  HENT: Negative for congestion, ear pain, hearing loss, nosebleeds and trouble swallowing.   Eyes: Negative for pain and visual disturbance.  Respiratory: Negative for cough, chest tightness and shortness of breath.   Cardiovascular: Negative for chest pain, palpitations and leg swelling.  Gastrointestinal: Negative for abdominal pain, blood in stool, constipation, diarrhea, nausea and vomiting.  Endocrine: Negative for polydipsia, polyphagia and polyuria.  Genitourinary: Positive for frequency. Negative for dysuria and flank pain.  Musculoskeletal: Negative for arthralgias, back pain, joint swelling, myalgias and neck stiffness.  Skin: Negative for color change, rash and wound.  Neurological: Negative for dizziness, tremors, seizures, speech difficulty, weakness, light-headedness and headaches.  Psychiatric/Behavioral: Negative for behavioral problems, confusion, decreased concentration, dysphoric mood and sleep disturbance. The patient is not nervous/anxious.   All other systems reviewed and are negative.   Social History  He  reports that he  has never smoked. He has never used smokeless tobacco. He reports that he does not drink alcohol or use drugs.       Social History   Socioeconomic History  . Marital status: Married    Spouse name: Not on file  . Number of children: Not on file  . Years of education: Not on file  . Highest education level: Not on file  Occupational History  . Not on file  Social Needs  . Financial resource strain: Not on file  . Food insecurity:    Worry: Not on file    Inability: Not on file  . Transportation needs:    Medical: Not on file    Non-medical: Not on file  Tobacco Use  . Smoking status: Never Smoker  . Smokeless tobacco: Never Used  Substance and Sexual Activity  . Alcohol use: No  . Drug use: No  . Sexual activity: Not on file  Lifestyle  . Physical activity:    Days per week: Not on file    Minutes per session: Not on file  . Stress: Not on file  Relationships  . Social connections:    Talks on phone: Not on file    Gets together: Not on file    Attends religious service: Not on file    Active member of club or organization: Not on file    Attends meetings of clubs or organizations: Not on file    Relationship status: Not on file  Other Topics Concern  . Not on file  Social History Narrative  . Not on file    Past Medical History:  Diagnosis Date  . Diabetes mellitus without complication ([place])   . GERD (gastroesophageal reflux disease)   . Hypertension   . Morbid obesity ([place])   . Sleep apnea      Patient Active Problem List   Diagnosis Date Noted  . Fournier gangrene 11/09/2016  . PVC (premature ventricular contraction) 08/16/2015  . Anxiety 09/15/2014  . Elevated CK 09/15/2014  . Essential hypertension 09/15/2014  . Hypertriglyceridemia 09/15/2014  . Hypotestosteronism 09/15/2014  . NASH (nonalcoholic steatohepatitis) 09/15/2014  . PE (pulmonary embolism) 09/15/2014  . Diabetes mellitus with diabetic neuropathy ([place]) 09/15/2014  . Apnea, sleep  09/15/2014  . Male hypogonadism 06/10/2013  . Metabolic syndrome 81/82/9937  . Complication of gastric banding-insufficient weight loss 11/29/2011  . Obesity-BMI 41 03/31/2011  . Status post gastric banding-APL 2008 03/31/2011  . WPW (Wolff-Parkinson-White syndrome)-cardiac ablation 1993 03/31/2011  . S/P cholecystectomy-2001 03/31/2011    Past Surgical History:  Procedure Laterality Date  . BREATH TEK H PYLORI  04/25/2011   Procedure: BREATH TEK H PYLORI;  Surgeon: Pedro Earls, MD;  Location: [facility];  Service: General;  Laterality: N/A;  . CHOLECYSTECTOMY  2001  . heart ablation  1993  . JOINT REPLACEMENT  2012   right hip  . LAPAROSCOPIC GASTRIC BANDING  2008  . SCROTAL EXPLORATION N/A 11/09/2016   Procedure: SCROTUM EXPLORATION;  Surgeon: Nickie Retort, MD;  Location: [facility];  Service: Urology;  Laterality: N/A;    Family History        Family Status  Relation Name Status  . Mother  Deceased at age 37  . Father  Deceased at age 62       suicide  . Sister  Alive  . Sister  Alive  . Sister  Alive        His family history includes  Cancer in his father, mother, sister, and sister; Diabetes in his mother; Heart failure in his mother.      Allergies  Allergen Reactions  . Metoclopramide Hcl Shortness Of Breath and Anxiety  . Glipizide     Profound weigh gain     Current Outpatient Medications:  .  Apoaequorin (PREVAGEN PO), Take 1 capsule by mouth daily., Disp: , Rfl:  .  atorvastatin (LIPITOR) 20 MG tablet, TAKE 1 TABLET BY MOUTH EVERY DAY, Disp: 90 tablet, Rfl: 4 .  Continuous Blood Gluc Transmit (DEXCOM G6 TRANSMITTER) MISC, Use to monitor blood sugar.  Replace every 3 months, Disp: , Rfl:  .  gabapentin (NEURONTIN) 100 MG capsule, Take 1 capsule by mouth 2 (two) times daily., Disp: , Rfl:  .  Ginkgo Biloba 100 MG CAPS, Take 1 capsule by mouth daily., Disp: , Rfl:  .  Insulin Glargine (BASAGLAR KWIKPEN) 100 UNIT/ML SOPN, Inject 60 units into the  skin daily at 10pm, Disp: , Rfl:  .  modafinil (PROVIGIL) 200 MG tablet, Take 1 tablet by mouth daily., Disp: , Rfl:  .  Multiple Vitamins-Minerals (MULTIVITAMIN PO), Take 1 tablet by mouth daily. , Disp: , Rfl:  .  pantoprazole (PROTONIX) 40 MG tablet, Take 1 tablet (40 mg total) by mouth daily., Disp: 90 tablet, Rfl: 3 .  aspirin 81 MG tablet, Take 81 mg by mouth daily., Disp: , Rfl:  .  lisinopril-hydrochlorothiazide (PRINZIDE,ZESTORETIC) 20-12.5 MG tablet, TAKE 1 TABLET BY MOUTH AT BEDTIME. (Patient not taking: Reported on 02/04/2018), Disp: 90 tablet, Rfl: 0 .  metoprolol succinate (TOPROL-XL) 50 MG 24 hr tablet, TAKE 1 TABLET (50 MG TOTAL) BY MOUTH DAILY. TAKE WITH OR IMMEDIATELY FOLLOWING A MEAL. (Patient not taking: Reported on 02/04/2018), Disp: 90 tablet, Rfl: 0 .  nitroGLYCERIN (NITROSTAT) 0.4 MG SL tablet, Place 1 tablet (0.4 mg total) under the tongue every 5 (five) minutes as needed for chest pain. (Patient not taking: Reported on 02/04/2018), Disp: 20 tablet, Rfl: 0 .  St Johns Wort 1000 MG CAPS, Take 1 capsule by mouth daily., Disp: , Rfl:    Patient Care Team: Birdie Sons, MD as PCP - General (Family Medicine) Lonia Farber, MD as Consulting Physician (Internal Medicine)      Objective:   Vitals: BP (!) 200/120 (BP Location: Left Arm, Patient Position: Sitting, Cuff Size: Large)   Pulse 80   Temp 97.6 F (36.4 C) (Oral)   Resp 16   Ht 6\' 4"  (1.93 m)   Wt 295 lb (133.8 kg)   SpO2 99% Comment: room air  BMI 35.91 kg/m    Vitals:   02/04/18 1009  BP: (!) 200/120  Pulse: 80  Resp: 16  Temp: 97.6 F (36.4 C)  TempSrc: Oral  SpO2: 99%  Weight: 295 lb (133.8 kg)  Height: 6\' 4"  (1.93 m)     Physical Exam   General Appearance:    Alert, cooperative, no distress, appears stated age, morbidly obese  Head:    Normocephalic, without obvious abnormality, atraumatic  Eyes:    PERRL, conjunctiva/corneas clear, EOM's intact, fundi    benign, both eyes         Ears:    Normal TM's and external ear canals, both ears  Nose:   Nares normal, septum midline, mucosa normal, no drainage   or sinus tenderness  Throat:   Lips, mucosa, and tongue normal; teeth and gums normal  Neck:   Supple, symmetrical, trachea midline, no adenopathy;  thyroid:  No enlargement/tenderness/nodules; no carotid   bruit or JVD  Back:     Symmetric, no curvature, ROM normal, no CVA tenderness  Lungs:     Clear to auscultation bilaterally, respirations unlabored  Chest wall:    No tenderness or deformity  Heart:    Regular rate and rhythm, S1 and S2 normal, no murmur, rub   or gallop  Abdomen:     Soft, non-tender, bowel sounds active all four quadrants,    no masses, no organomegaly  Genitalia:    deferred  Rectal:    mildly enlarged prostate. no masses, symmetric.   Extremities:   Extremities normal, atraumatic, no cyanosis or edema  Pulses:   2+ and symmetric all extremities  Skin:   Skin color, texture, turgor normal, no rashes or lesions  Lymph nodes:   Cervical, supraclavicular, and axillary nodes normal  Neurologic:   CNII-XII intact. Normal strength, sensation and reflexes      throughout    Depression Screen PHQ 2/9 Scores 02/04/2018 06/27/2016 06/07/2015  PHQ - 2 Score 0 0 0  PHQ- 9 Score 0 0 -      Assessment & Plan:     Routine Health Maintenance and Physical Exam  Exercise Activities and Dietary recommendations Goals   None     Immunization History  Administered Date(s) Administered  . Influenza Inj Mdck Quad Pf 12/01/2017  . Influenza,inj,Quad PF,6+ Mos 11/28/2012, 02/23/2014, 11/22/2015, 10/23/2016  . Influenza-Unspecified 11/14/2014, 11/09/2016  . Pneumococcal Polysaccharide-23 11/28/2012    Health Maintenance  Topic Date Due  . Hepatitis C Screening  1960-11-30  . FOOT EXAM  12/25/1968  . OPHTHALMOLOGY EXAM  12/25/1968  . TETANUS/TDAP  12/25/1977  . HEMOGLOBIN A1C  12/28/2016  . COLONOSCOPY  05/22/2020  . INFLUENZA VACCINE   Completed  . PNEUMOCOCCAL POLYSACCHARIDE VACCINE AGE 17-64 HIGH RISK  Completed  . HIV Screening  Completed     Discussed health benefits of physical activity, and encouraged him to engage in regular exercise appropriate for his age and condition.    --------------------------------------------------------------------   1. Annual physical exam  - Comprehensive metabolic panel - Lipid panel   2. Essential hypertension Now off of metoprolol and lisinopril-hctz due to hypotension. He had lost significant weight over the last year so likely does not require previous doseages of medications. Will restart 1/2 dose oflisinopril-hydrochlorothiazide (PRINZIDE,ZESTORETIC) 20-12.5 MG tablet  Can keep metoprolol on hold since he is no longer having palpations. - Comprehensive metabolic panel - Lipid panel  3. Prostate cancer screening -psa - Lipid panel  4. Metabolic syndrome   5. Urinary hesitancy Likely BPH. Consider tamsulosin if PSA is normal.   6. Sleep apnea, unspecified type Is using CPAP consistently   7. Need for shingles vaccine  - Varicella-zoster vaccine IM   Lelon Huh, MD  [facility]

## 2018-02-05 ENCOUNTER — Other Ambulatory Visit: Payer: Self-pay | Admitting: Family Medicine

## 2018-02-05 MED ORDER — TAMSULOSIN HCL 0.4 MG PO CAPS: 0.4000 mg | ORAL_CAPSULE | Freq: Every day | ORAL | 2 refills | Status: DC

## 2018-02-27 LAB — HM DIABETES EYE EXAM

## 2018-02-28 ENCOUNTER — Encounter: Payer: Self-pay | Admitting: Family Medicine

## 2018-02-28 ENCOUNTER — Telehealth: Payer: Self-pay | Admitting: Family Medicine

## 2018-02-28 DIAGNOSIS — E11319 Type 2 diabetes mellitus with unspecified diabetic retinopathy without macular edema: Secondary | ICD-10-CM | POA: Insufficient documentation

## 2018-02-28 NOTE — Telephone Encounter (Signed)
A nurse visit is fine.

## 2018-02-28 NOTE — Telephone Encounter (Signed)
\   BP Readings from Last 3 Encounters:  02/04/18 (!) 200/120  12/20/17 (!) 146/88  11/21/16 126/80   Please advise patient we need to have him come by for BP check since his bp was extremely high in December. He was supposed to have started back on some of his BP meds and we need to make sure it is coming down.

## 2018-02-28 NOTE — Telephone Encounter (Signed)
Did you want him to schedule an appointment with you or to have him come in for a "nurse visit" and have his blood pressure check.    Thanks,   -Mickel Baas

## 2018-03-01 NOTE — Telephone Encounter (Signed)
Left message to call back  

## 2018-03-06 NOTE — Telephone Encounter (Signed)
LMTCB 03/06/2018  Thanks,   -Mickel Baas

## 2018-03-12 ENCOUNTER — Encounter: Payer: Self-pay | Admitting: Physician Assistant

## 2018-03-12 ENCOUNTER — Ambulatory Visit: Payer: PRIVATE HEALTH INSURANCE | Admitting: Physician Assistant

## 2018-03-12 ENCOUNTER — Telehealth: Payer: Self-pay

## 2018-03-12 VITALS — BP 135/84 | HR 74 | Temp 98.4°F | Wt 290.8 lb

## 2018-03-12 DIAGNOSIS — I10 Essential (primary) hypertension: Secondary | ICD-10-CM

## 2018-03-12 DIAGNOSIS — Z6841 Body Mass Index (BMI) 40.0 and over, adult: Secondary | ICD-10-CM

## 2018-03-12 DIAGNOSIS — R6889 Other general symptoms and signs: Secondary | ICD-10-CM | POA: Diagnosis not present

## 2018-03-12 DIAGNOSIS — R42 Dizziness and giddiness: Secondary | ICD-10-CM

## 2018-03-12 DIAGNOSIS — E0840 Diabetes mellitus due to underlying condition with diabetic neuropathy, unspecified: Secondary | ICD-10-CM

## 2018-03-12 NOTE — Telephone Encounter (Signed)
Pt called complaining dizziness, and shortness of breath while working the last two nights.  He reports he is feeling okay now.  He states his blood sugar is usually under good control sometimes below 100 when he goes to bed.  Recently it has been elevated in the mid to high 100's.   I made an appointment with Adriana today at 9:40 since pt states he is feeling better.  I advised him that if he starts having symptoms again he would need to be evaluated at the ER.  He agreed.   Thanks,   -Mickel Baas

## 2018-03-12 NOTE — Progress Notes (Signed)
Patient: Howard Young Male    DOB: April 16, 1960   58 y.o.   MRN: [id number] Visit Date: 03/12/2018  Today's Provider: Trinna Post, PA-C   Chief Complaint  Patient presents with  . Dizziness   Subjective:    Dizziness  The current episode started in the past 7 days. Associated symptoms include fatigue and weakness. The symptoms are aggravated by bending and standing.   Patient with history of Type II DM, HTN, and BPH on flomax presents today with dizziness. He reports he had several episodes at work where he stood up and got dizzy, felt like he had to hold onto something, but this resolved quickly. It only happened a couple of times. He has a diuretic listed on his medication list but he is not taking this. He is taking flomax for BPH.   He is being treated for Type II DM by Dr. Honor Junes in endocrinology. Last A1c is 6.2. He has a continuous glucose monitoring system. In the past year he has begun intermittent fasting and lost 20 lbs. He has weaned off of basaglar insulin from 60 units QHS to 30 units QHS. He was instructed by Dr. Honor Junes last October to titrate back up to metformin 1000 mg BID because he had self discontinued this as he heard it was bad for him. He is only taking metformin 500 mg bid currently. He also self discontinued his ozempic two weeks ago and then restarted again recently. He wants to know why his sugars have been running high as his CGM system has shown a higher overall proportion uncontrolled blood sugars than last week. Additionally, he increased his basaglar from 30 units to 60 units because his sugars were running high. Says when his sugars are running normally he stops his diabetes medication because they are good and will restart it when they run high again.   HTN: Last BP was high in this clinic. He was instructed by Dr. Caryn Section to take half pill of Lisinopril-HCTZ 20-25 mg daily which patient reports he is not taking. He reports he is taking  lisinopril 20 mg daily which Dr. Honor Junes had given him for microalbuminuria.   BP Readings from Last 8 Encounters:  03/12/18 135/84  02/04/18 (!) 200/120  12/20/17 (!) 146/88  11/21/16 126/80  11/10/16 (!) 111/59  10/23/16 130/86  10/09/16 131/68  09/04/16 118/72    Wt Readings from Last 3 Encounters:  03/12/18 290 lb 12.8 oz (131.9 kg)  02/04/18 295 lb (133.8 kg)  12/20/17 (!) 310 lb 9.6 oz (140.9 kg)   He also reports he has been feeling shaky recently. Has checked blood sugars during this time they are not low. He also reports memory loss. He reports a strong recall of many facts. Reports the other day he couldn't remember [name]'s name and this is very unusual form him as he is an avid baseball fan. He is requesting a referral to neurology to further evaluate this.      Allergies  Allergen Reactions  . Metoclopramide Hcl Shortness Of Breath and Anxiety  . Glipizide     Profound weigh gain     Current Outpatient Medications:  .  Apoaequorin (PREVAGEN PO), Take 1 capsule by mouth daily., Disp: , Rfl:  .  atorvastatin (LIPITOR) 20 MG tablet, TAKE 1 TABLET BY MOUTH EVERY DAY, Disp: 90 tablet, Rfl: 4 .  Continuous Blood Gluc Transmit (DEXCOM G6 TRANSMITTER) MISC, Use to monitor blood sugar.  Replace every  3 months, Disp: , Rfl:  .  Ginkgo Biloba 100 MG CAPS, Take 1 capsule by mouth daily., Disp: , Rfl:  .  Insulin Glargine (BASAGLAR KWIKPEN) 100 UNIT/ML SOPN, Inject 60 units into the skin daily at 10pm, Disp: , Rfl:  .  lisinopril-hydrochlorothiazide (PRINZIDE,ZESTORETIC) 20-12.5 MG tablet, Take 0.5 tablets by mouth at bedtime., Disp: 3 tablet, Rfl: 0 .  metFORMIN (GLUCOPHAGE) 500 MG tablet, Take 500 mg by mouth 2 (two) times daily with a meal., Disp: , Rfl:  .  modafinil (PROVIGIL) 200 MG tablet, Take 1 tablet by mouth daily., Disp: , Rfl:  .  Multiple Vitamins-Minerals (MULTIVITAMIN PO), Take 1 tablet by mouth daily. , Disp: , Rfl:  .  pantoprazole (PROTONIX) 40 MG  tablet, Take 1 tablet (40 mg total) by mouth daily., Disp: 90 tablet, Rfl: 3 .  Semaglutide (OZEMPIC, 1 MG/DOSE, [place]), Inject 1 mg into the skin once a week., Disp: , Rfl:  .  St Johns Wort 1000 MG CAPS, Take 1 capsule by mouth daily., Disp: , Rfl:  .  tamsulosin (FLOMAX) 0.4 MG CAPS capsule, Take 1 capsule (0.4 mg total) by mouth daily., Disp: 30 capsule, Rfl: 2 .  aspirin 81 MG tablet, Take 81 mg by mouth daily., Disp: , Rfl:  .  gabapentin (NEURONTIN) 100 MG capsule, Take 1 capsule by mouth 2 (two) times daily., Disp: , Rfl:  .  nitroGLYCERIN (NITROSTAT) 0.4 MG SL tablet, Place 1 tablet (0.4 mg total) under the tongue every 5 (five) minutes as needed for chest pain. (Patient not taking: Reported on 03/12/2018), Disp: 20 tablet, Rfl: 0  Review of Systems  Constitutional: Positive for fatigue.  Neurological: Positive for dizziness and weakness.    Social History   Tobacco Use  . Smoking status: Never Smoker  . Smokeless tobacco: Never Used  Substance Use Topics  . Alcohol use: No      Objective:   BP 135/84 (BP Location: Right Arm, Patient Position: Sitting, Cuff Size: Large)   Pulse 74   Temp 98.4 F (36.9 C) (Oral)   Wt 290 lb 12.8 oz (131.9 kg)   SpO2 98%   BMI 35.40 kg/m  Vitals:   03/12/18 0954  BP: 135/84  Pulse: 74  Temp: 98.4 F (36.9 C)  TempSrc: Oral  SpO2: 98%  Weight: 290 lb 12.8 oz (131.9 kg)     Physical Exam Constitutional:      Appearance: Normal appearance.  Cardiovascular:     Rate and Rhythm: Normal rate and regular rhythm.     Heart sounds: Normal heart sounds.  Pulmonary:     Effort: Pulmonary effort is normal.     Breath sounds: Normal breath sounds.  Neurological:     General: No focal deficit present.     Mental Status: He is alert and oriented to person, place, and time.         Assessment & Plan    1. Diabetes mellitus due to underlying condition with diabetic neuropathy, unspecified whether long term insulin use ([place])  Diabetes  is well controlled but patient is making frequent adjustments to his medication without awareness as to how they impact blood sugar. Have instructed patient to increase metformin to 1000 mg BID as instructed by Dr. Honor Junes. Have instructed patient to keep taking ozempic weekly - counseled that he will likely not see sugars increase for a couple of weeks until after he stopped this because this drug has a long half life. Have advised against increasing basaglar  from 30 units nightly to 60 units nightly in one night as he risks hypoglycemia. He should resume former lower dose of 30 units nightly if that is what he was taking consistently.  2. Essential hypertension  Has improved. Intolerant of metorpolol. Currently taking Lisinopril 20 mg daily.   3. Class 3 severe obesity with serious comorbidity and body mass index (BMI) of 40.0 to 44.9 in adult, unspecified obesity type ([place])  Lost 20 lbs since November, keep up the good work.  4. Forgetfulness  Requested neurology referral today.   - Ambulatory referral to Neurology  5. Dizziness   Vitals not orthostatic today, though his symptoms do fit with orthostasis and he is on flomax. Counseled to push fluids, caution with changing from sitting to standing positions.   The entirety of the information documented in the History of Present Illness, Review of Systems and Physical Exam were personally obtained by me. Portions of this information were initially documented by [facility], CMA and reviewed by me for thoroughness and accuracy.    Return in about 3 months (around 06/11/2018) for HTn with Dr. Caryn Section.     Trinna Post, PA-C  [facility]

## 2018-03-12 NOTE — Patient Instructions (Addendum)
Increase metformin to two pills twice daily.  Please take ozempic 1 mg weekly Please do not increase basaglar from 30 units to 60 units. Please stay at current dose.

## 2018-04-08 ENCOUNTER — Ambulatory Visit (INDEPENDENT_AMBULATORY_CARE_PROVIDER_SITE_OTHER): Payer: PRIVATE HEALTH INSURANCE | Admitting: Family Medicine

## 2018-04-08 DIAGNOSIS — Z23 Encounter for immunization: Secondary | ICD-10-CM

## 2018-04-08 NOTE — Patient Instructions (Signed)
.   Please review the attached list of medications and notify my office if there are any errors.   . Please bring all of your medications to every appointment so we can make sure that our medication list is the same as yours.   

## 2018-04-08 NOTE — Progress Notes (Signed)
Patient here today for 2nd Shingles vaccine. Patient got first one on 02/04/2018 reports no problems or concerns with vaccine.

## 2018-04-10 DIAGNOSIS — R413 Other amnesia: Secondary | ICD-10-CM | POA: Insufficient documentation

## 2018-05-11 ENCOUNTER — Other Ambulatory Visit: Payer: Self-pay | Admitting: Family Medicine

## 2018-07-31 IMAGING — CT CT ANGIO CHEST
2 of 7 series · 18 of 46 positions shown · IV contrast (isovue)
Comparison: 02/22/2004, 10/09/2016

CLINICAL DATA: Dyspnea and palpitations with exertion, onset this
morning.

EXAM:
CT ANGIOGRAPHY CHEST WITH CONTRAST
TECHNIQUE: Multidetector CT imaging of the chest was performed using the
standard protocol during bolus administration of intravenous
contrast. Multiplanar CT image reconstructions and MIPs were
obtained to evaluate the vascular anatomy.
CONTRAST:  75 mL Isovue 370 intravenous

[Series 5: thins · axial · 0.91mm/px · z∈[-580,-302]mm · 16 of 312 slices shown]
[im 17/312  lung]
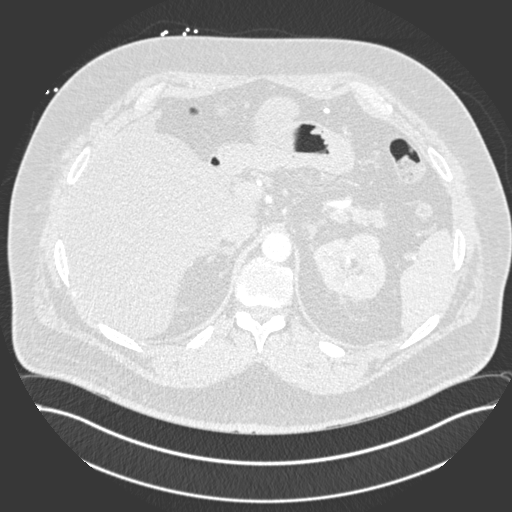
[im 33/312  soft-tissue]
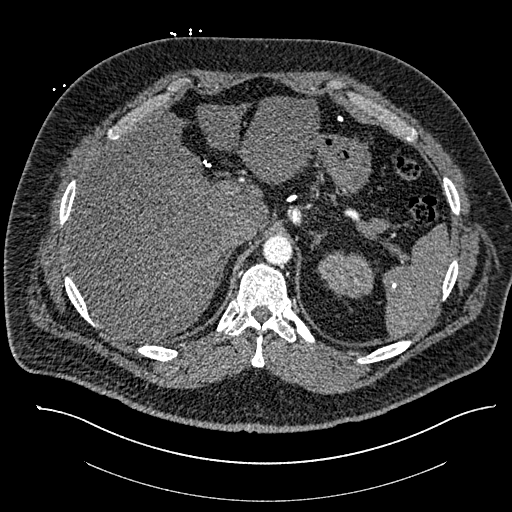
[im 50/312  lung]
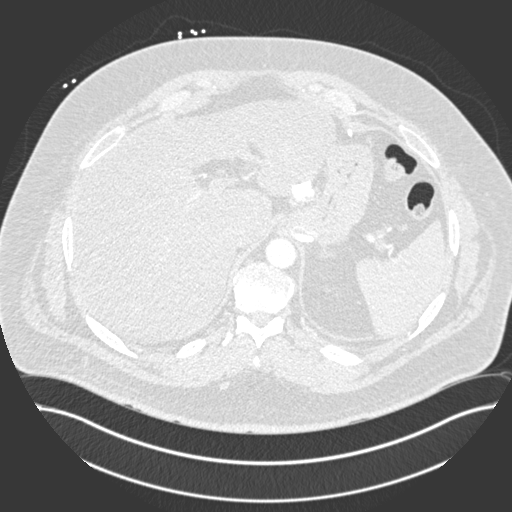
[im 66/312  soft-tissue]
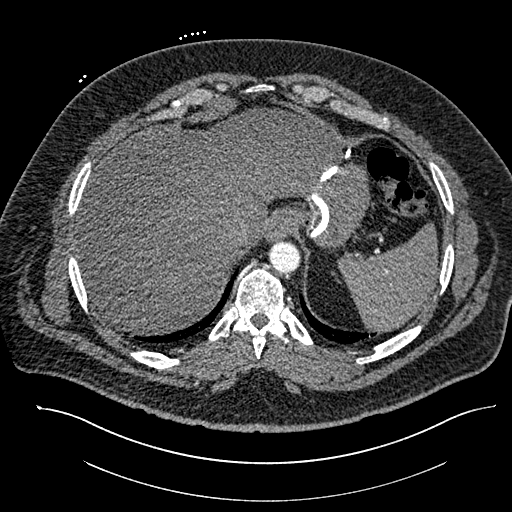
[im 99/312  lung]
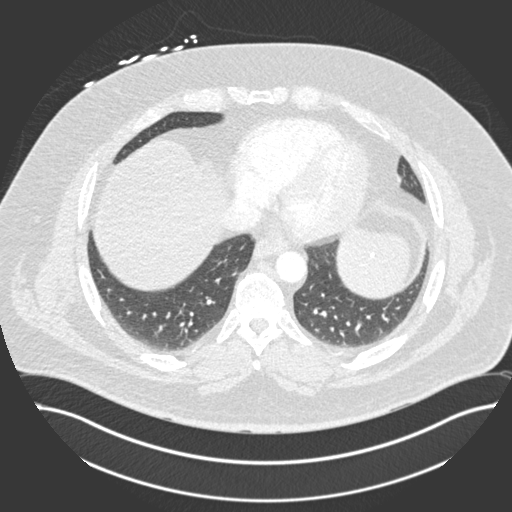
[im 115/312  soft-tissue]
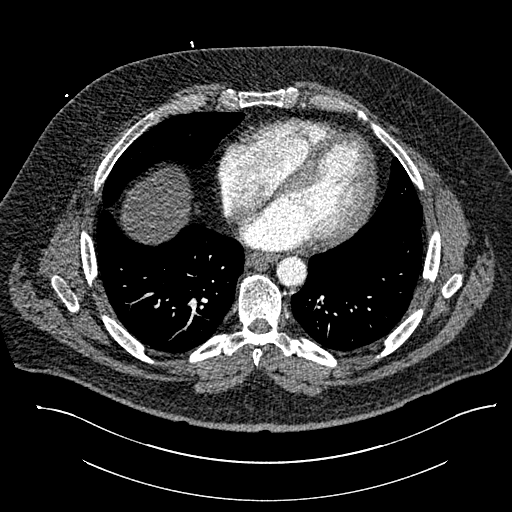
[im 131/312  lung]
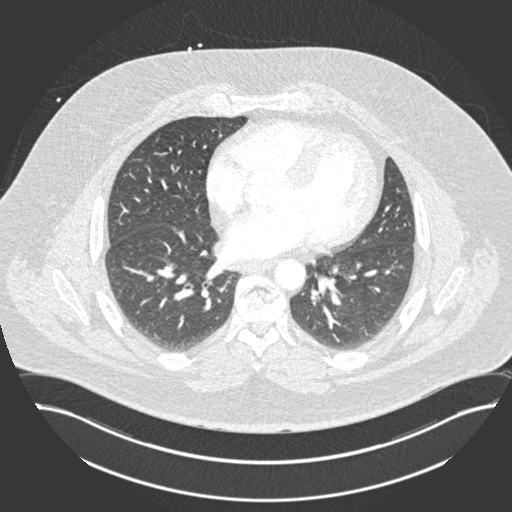
[im 148/312  soft-tissue]
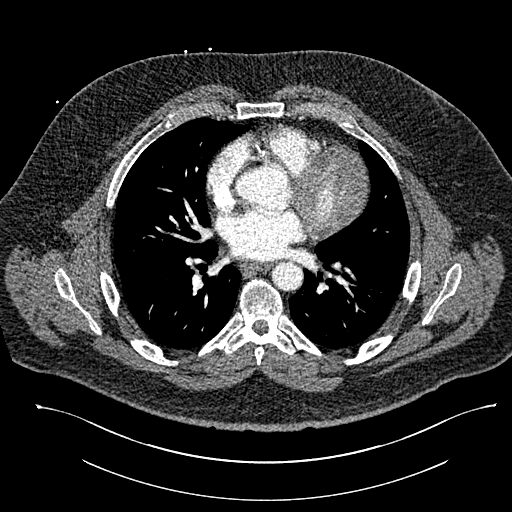
[im 164/312  lung]
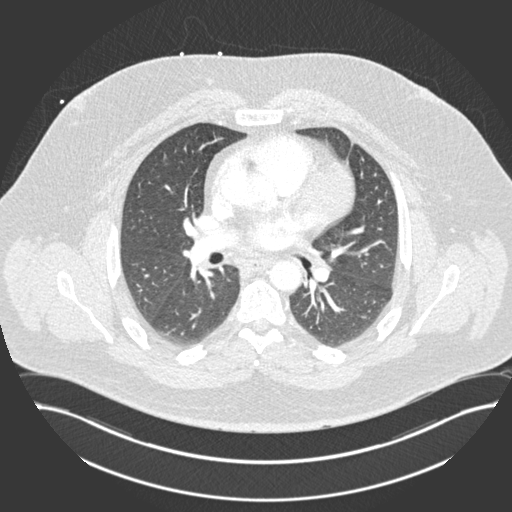
[im 181/312  soft-tissue]
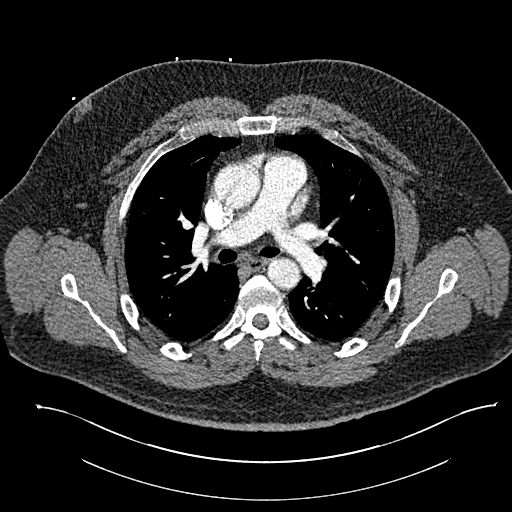
[im 197/312  lung]
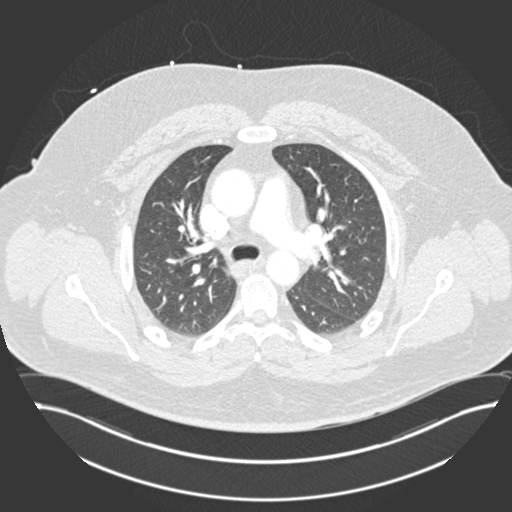
[im 213/312  soft-tissue]
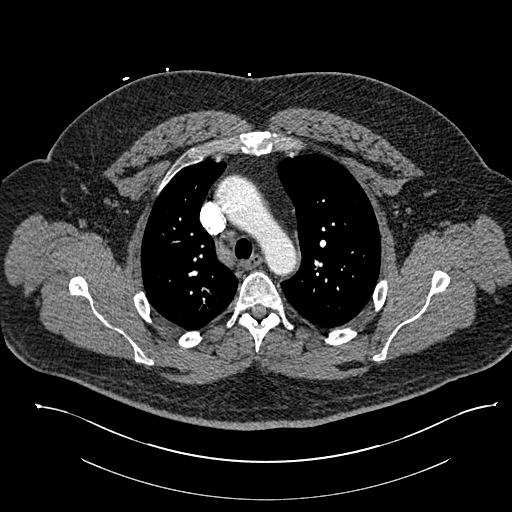
[im 246/312  lung]
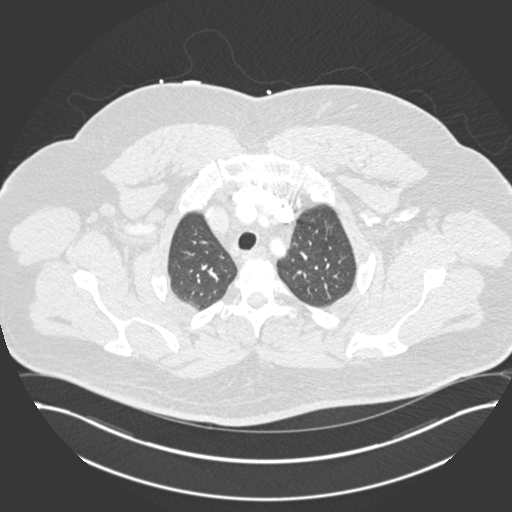
[im 262/312  soft-tissue]
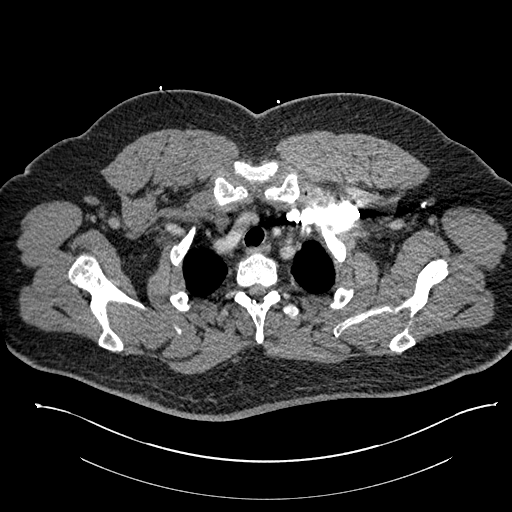
[im 279/312  lung]
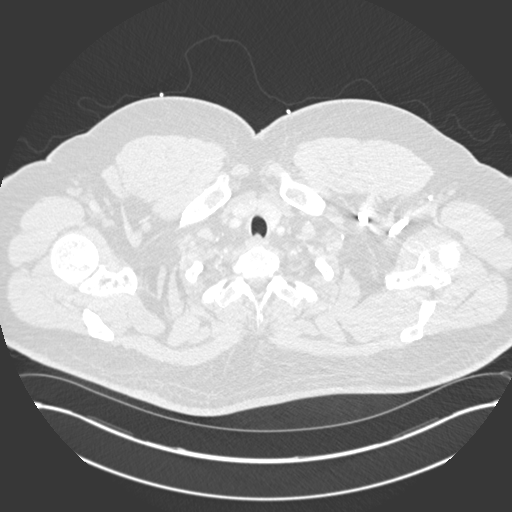
[im 295/312  soft-tissue]
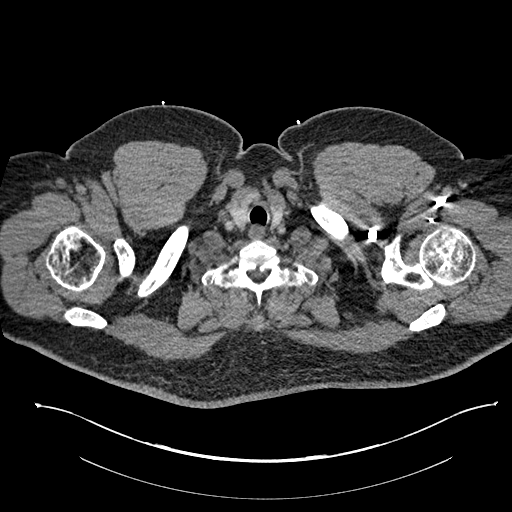

[Series 7: coronal mpr · coronal · 0.61mm/px · 2 of 107 slices shown]
[im 36/107  soft-tissue]
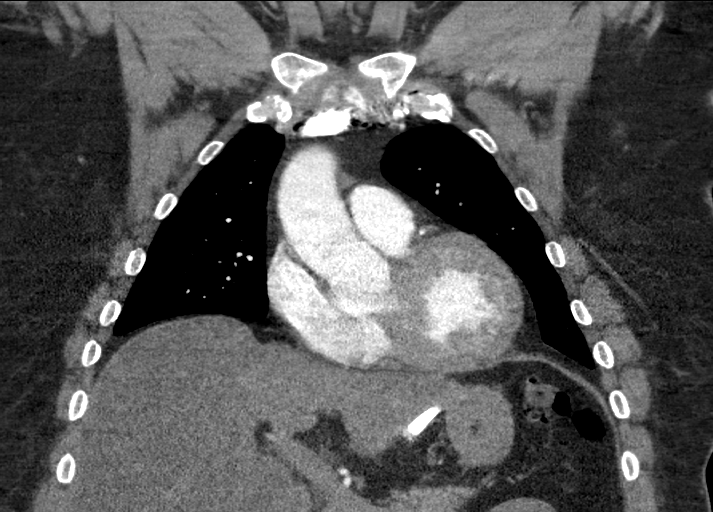
[im 71/107  soft-tissue]
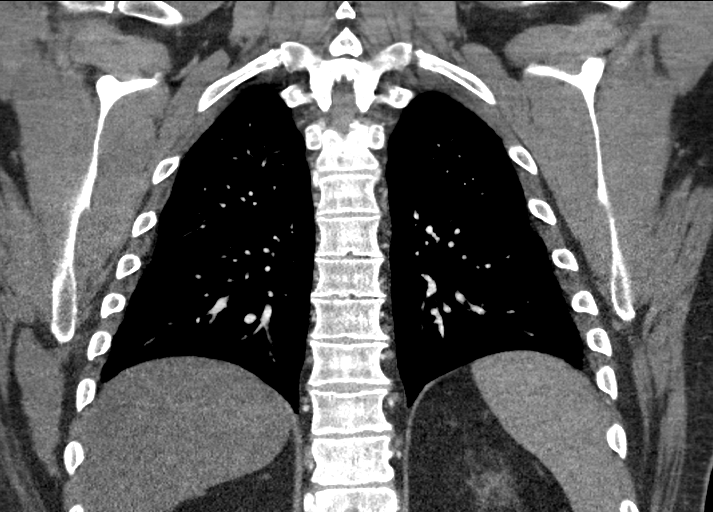

[18 of 46 positions shown; findings below may reference images not displayed]

FINDINGS: Cardiovascular: Satisfactory opacification of the pulmonary arteries
to the segmental level. No evidence of pulmonary embolism. Normal
heart size. No pericardial effusion.

Mediastinum/Nodes: No enlarged mediastinal, hilar, or axillary lymph
nodes. Thyroid gland, trachea, and esophagus demonstrate no
significant findings.

Lungs/Pleura: Lungs are clear. No pleural effusion or pneumothorax.

Upper Abdomen: No acute findings.  Lap band noted.

Musculoskeletal: No significant skeletal lesion.

Review of the MIP images confirms the above findings.
IMPRESSION: Negative for acute pulmonary embolism.  No significant abnormality.

## 2018-09-27 ENCOUNTER — Other Ambulatory Visit: Payer: Self-pay | Admitting: Family Medicine

## 2018-12-17 DIAGNOSIS — Z794 Long term (current) use of insulin: Secondary | ICD-10-CM | POA: Diagnosis not present

## 2018-12-17 DIAGNOSIS — E1169 Type 2 diabetes mellitus with other specified complication: Secondary | ICD-10-CM | POA: Diagnosis not present

## 2018-12-17 DIAGNOSIS — R790 Abnormal level of blood mineral: Secondary | ICD-10-CM | POA: Diagnosis not present

## 2018-12-17 DIAGNOSIS — I10 Essential (primary) hypertension: Secondary | ICD-10-CM | POA: Diagnosis not present

## 2018-12-17 DIAGNOSIS — E785 Hyperlipidemia, unspecified: Secondary | ICD-10-CM | POA: Diagnosis not present

## 2018-12-17 DIAGNOSIS — E1142 Type 2 diabetes mellitus with diabetic polyneuropathy: Secondary | ICD-10-CM | POA: Diagnosis not present

## 2018-12-17 DIAGNOSIS — E1159 Type 2 diabetes mellitus with other circulatory complications: Secondary | ICD-10-CM | POA: Diagnosis not present

## 2019-04-14 DIAGNOSIS — R413 Other amnesia: Secondary | ICD-10-CM | POA: Diagnosis not present

## 2019-04-14 DIAGNOSIS — G4733 Obstructive sleep apnea (adult) (pediatric): Secondary | ICD-10-CM | POA: Diagnosis not present

## 2019-04-20 ENCOUNTER — Ambulatory Visit: Payer: BC Managed Care – PPO | Attending: Internal Medicine

## 2019-04-20 DIAGNOSIS — Z23 Encounter for immunization: Secondary | ICD-10-CM | POA: Insufficient documentation

## 2019-04-20 NOTE — Progress Notes (Signed)
   Covid-19 Vaccination Clinic  Name:  Howard Young    MRN: [id number] DOB: 09/11/1960  04/20/2019  Howard Young was observed post Covid-19 immunization for 15 minutes without incident. He was provided with Vaccine Information Sheet and instruction to access the V-Safe system.   Howard Young was instructed to call 911 with any severe reactions post vaccine: Marland Kitchen Difficulty breathing  . Swelling of face and throat  . A fast heartbeat  . A bad rash all over body  . Dizziness and weakness   Immunizations Administered    Name Date Dose VIS Date Route   Pfizer COVID-19 Vaccine 04/20/2019  4:30 PM 0.3 mL 01/24/2019 Intramuscular   Manufacturer: [place]   Lot: [id number]   [place]: [id number]

## 2019-04-21 DIAGNOSIS — E785 Hyperlipidemia, unspecified: Secondary | ICD-10-CM | POA: Diagnosis not present

## 2019-04-21 DIAGNOSIS — E1169 Type 2 diabetes mellitus with other specified complication: Secondary | ICD-10-CM | POA: Diagnosis not present

## 2019-04-21 DIAGNOSIS — E1142 Type 2 diabetes mellitus with diabetic polyneuropathy: Secondary | ICD-10-CM | POA: Diagnosis not present

## 2019-04-21 DIAGNOSIS — R790 Abnormal level of blood mineral: Secondary | ICD-10-CM | POA: Diagnosis not present

## 2019-04-21 DIAGNOSIS — E1159 Type 2 diabetes mellitus with other circulatory complications: Secondary | ICD-10-CM | POA: Diagnosis not present

## 2019-04-21 DIAGNOSIS — Z794 Long term (current) use of insulin: Secondary | ICD-10-CM | POA: Diagnosis not present

## 2019-04-21 DIAGNOSIS — I10 Essential (primary) hypertension: Secondary | ICD-10-CM | POA: Diagnosis not present

## 2019-05-01 DIAGNOSIS — F4321 Adjustment disorder with depressed mood: Secondary | ICD-10-CM | POA: Diagnosis not present

## 2019-05-13 ENCOUNTER — Ambulatory Visit: Payer: BC Managed Care – PPO | Attending: Internal Medicine

## 2019-05-13 DIAGNOSIS — Z23 Encounter for immunization: Secondary | ICD-10-CM

## 2019-05-13 NOTE — Progress Notes (Signed)
   Covid-19 Vaccination Clinic  Name:  Howard Young    MRN: [id number] DOB: 08/29/1960  05/13/2019  [name] was observed post Covid-19 immunization for 15 minutes without incident. He was provided with Vaccine Information Sheet and instruction to access the V-Safe system.   [name] was instructed to call 911 with any severe reactions post vaccine: Marland Kitchen Difficulty breathing  . Swelling of face and throat  . A fast heartbeat  . A bad rash all over body  . Dizziness and weakness   Immunizations Administered    Name Date Dose VIS Date Route   Pfizer COVID-19 Vaccine 05/13/2019  9:39 AM 0.3 mL 01/24/2019 Intramuscular   Manufacturer: [place]   Lot: [id number]   [place]: [id number]

## 2019-06-23 ENCOUNTER — Ambulatory Visit: Payer: BC Managed Care – PPO | Admitting: Physician Assistant

## 2019-06-23 ENCOUNTER — Other Ambulatory Visit: Payer: Self-pay

## 2019-06-23 ENCOUNTER — Encounter: Payer: Self-pay | Admitting: Physician Assistant

## 2019-06-23 VITALS — BP 124/72 | HR 73 | Temp 96.9°F | Wt 311.8 lb

## 2019-06-23 DIAGNOSIS — R1905 Periumbilic swelling, mass or lump: Secondary | ICD-10-CM | POA: Diagnosis not present

## 2019-06-23 NOTE — Progress Notes (Signed)
Established patient visit   Patient: Howard Young   DOB: June 08, 1960   59 y.o. Male  MRN: [id number] Visit Date: 06/23/2019  Today's healthcare provider: Trinna Post, PA-C   Chief Complaint  Patient presents with  . Skin Problem   Subjective    HPI Skin Check Patient comes into office today with concerns of a possioble "lump/knot" found on his abdomen that he describes as purplish in color. This has been present for 3-4 months. Patient states that when pressed on the lump does not move, he states that it is firm to the touch. Patient reports that lump is located right under his belly button. Patient denies any  Trauma, past abdominal surgeries or exertion/heavy lifting. He reports it has gotten slightly bigger. There are no other abdominal masses. He denies fevers, chills, unintentional weight loss, abdominal pain, diarrhea or constipation.   Patient Active Problem List   Diagnosis Date Noted  . Diabetic retinopathy ([place]) 02/28/2018  . Fournier gangrene 11/09/2016  . PVC (premature ventricular contraction) 08/16/2015  . Anxiety 09/15/2014  . Elevated CK 09/15/2014  . Essential hypertension 09/15/2014  . Hypertriglyceridemia 09/15/2014  . Hypotestosteronism 09/15/2014  . NASH (nonalcoholic steatohepatitis) 09/15/2014  . PE (pulmonary embolism) 09/15/2014  . Diabetes mellitus with diabetic neuropathy ([place]) 09/15/2014  . Apnea, sleep 09/15/2014  . Male hypogonadism 06/10/2013  . Metabolic syndrome 44/31/5400  . Complication of gastric banding-insufficient weight loss 11/29/2011  . Obesity-BMI 41 03/31/2011  . Status post gastric banding-APL 2008 03/31/2011  . WPW (Wolff-Parkinson-White syndrome)-cardiac ablation 1993 03/31/2011  . S/P cholecystectomy-2001 03/31/2011   Past Medical History:  Diagnosis Date  . Diabetes mellitus without complication ([place])   . GERD (gastroesophageal reflux disease)   . Hypertension   . Morbid obesity ([place])   . Sleep apnea     Allergies  Allergen Reactions  . Metoclopramide Hcl Shortness Of Breath and Anxiety  . Glipizide     Profound weigh gain       Medications: Outpatient Medications Prior to Visit  Medication Sig  . Continuous Blood Gluc Sensor (FREESTYLE LIBRE 2 SENSOR) MISC USE 1 KIT EVERY 14 (FOURTEEN) DAYS FOR GLUCOSE MONITORING  . gabapentin (NEURONTIN) 100 MG capsule Take 1 capsule by mouth 2 (two) times daily.  Marland Kitchen lisinopril-hydrochlorothiazide (ZESTORETIC) 20-12.5 MG tablet Take by mouth.  . metFORMIN (GLUCOPHAGE) 500 MG tablet Take 500 mg by mouth 2 (two) times daily with a meal.  . metoprolol succinate (TOPROL-XL) 50 MG 24 hr tablet TAKE 1 TABLET (50 MG TOTAL) BY MOUTH DAILY. TAKE WITH OR IMMEDIATELY FOLLOWING A MEAL.  Marland Kitchen OZEMPIC, 1 MG/DOSE, 2 MG/1.5ML SOPN SMARTSIG:0.75 Milliliter(s) SUB-Q Once a Week  . TOUJEO SOLOSTAR 300 UNIT/ML Solostar Pen Inject 50 Units into the skin.  . tamsulosin (FLOMAX) 0.4 MG CAPS capsule TAKE 1 CAPSULE BY MOUTH EVERY DAY (Patient not taking: Reported on 06/23/2019)  . [DISCONTINUED] Apoaequorin (PREVAGEN PO) Take 1 capsule by mouth daily.  . [DISCONTINUED] aspirin 81 MG tablet Take 81 mg by mouth daily.  . [DISCONTINUED] atorvastatin (LIPITOR) 20 MG tablet TAKE 1 TABLET BY MOUTH EVERY DAY  . [DISCONTINUED] Continuous Blood Gluc Transmit (DEXCOM G6 TRANSMITTER) MISC Use to monitor blood sugar.  Replace every 3 months  . [DISCONTINUED] Ginkgo Biloba 100 MG CAPS Take 1 capsule by mouth daily.  . [DISCONTINUED] Insulin Glargine (BASAGLAR KWIKPEN) 100 UNIT/ML SOPN Inject 60 units into the skin daily at 10pm  . [DISCONTINUED] lisinopril-hydrochlorothiazide (PRINZIDE,ZESTORETIC) 20-12.5 MG tablet Take 0.5 tablets by  mouth at bedtime.  . [DISCONTINUED] modafinil (PROVIGIL) 200 MG tablet Take 1 tablet by mouth daily.  . [DISCONTINUED] Multiple Vitamins-Minerals (MULTIVITAMIN PO) Take 1 tablet by mouth daily.   . [DISCONTINUED] nitroGLYCERIN (NITROSTAT) 0.4 MG SL tablet Place  1 tablet (0.4 mg total) under the tongue every 5 (five) minutes as needed for chest pain. (Patient not taking: Reported on 03/12/2018)  . [DISCONTINUED] pantoprazole (PROTONIX) 40 MG tablet Take 1 tablet (40 mg total) by mouth daily.  . [DISCONTINUED] Semaglutide (OZEMPIC, 1 MG/DOSE, [place]) Inject 1 mg into the skin once a week.  . [DISCONTINUED] St Johns Wort 1000 MG CAPS Take 1 capsule by mouth daily.   No facility-administered medications prior to visit.    Review of Systems  Constitutional: Negative.   HENT: Negative.   Respiratory: Negative.   Cardiovascular: Negative.   Gastrointestinal: Positive for diarrhea.  Musculoskeletal: Negative.       Objective    BP 124/72   Pulse 73   Temp (!) 96.9 F (36.1 C) (Oral)   Wt (!) 311 lb 12.8 oz (141.4 kg)   HC 16" (40.6 cm)   SpO2 94%   BMI 37.95 kg/m    Physical Exam Constitutional:      Appearance: Normal appearance. He is obese.  Cardiovascular:     Rate and Rhythm: Normal rate.  Pulmonary:     Effort: Pulmonary effort is normal.  Abdominal:     General: Bowel sounds are normal. There is no distension.     Palpations: Abdomen is soft. There is mass.     Tenderness: There is no abdominal tenderness. There is no guarding.     Hernia: No hernia is present.    Skin:    General: Skin is warm and dry.  Neurological:     Mental Status: He is alert and oriented to person, place, and time. Mental status is at baseline.  Psychiatric:        Mood and Affect: Mood normal.        Behavior: Behavior normal.       No results found for any visits on 06/23/19.  Assessment & Plan    1. Periumbilical mass  Cyst vs lymph node? Will get CBC as below and ultrasound. Reviewed 04/21/2019 labwork from [facility] in Care everywhere. Follow up pending results.   - CBC with Differential - US Abdomen Complete; Future       I, Trinna Post, PA-C, have reviewed all documentation for this visit. The documentation on 06/23/19  for the exam, diagnosis, procedures, and orders are all accurate and complete.    Paulene Floor  [facility] [phone] (phone) [phone] (fax)  [place]

## 2019-06-24 LAB — CBC WITH DIFFERENTIAL/PLATELET
Basophils Absolute: 0.1 10*3/uL (ref 0.0–0.2)
Basos: 1 %
EOS (ABSOLUTE): 0.1 10*3/uL (ref 0.0–0.4)
Eos: 1 %
Hematocrit: 42.7 % (ref 37.5–51.0)
Hemoglobin: 14.7 g/dL (ref 13.0–17.7)
Immature Grans (Abs): 0 10*3/uL (ref 0.0–0.1)
Immature Granulocytes: 0 %
Lymphocytes Absolute: 1.4 10*3/uL (ref 0.7–3.1)
Lymphs: 21 %
MCH: 28.9 pg (ref 26.6–33.0)
MCHC: 34.4 g/dL (ref 31.5–35.7)
MCV: 84 fL (ref 79–97)
Monocytes Absolute: 0.9 10*3/uL (ref 0.1–0.9)
Monocytes: 13 %
Neutrophils Absolute: 4.2 10*3/uL (ref 1.4–7.0)
Neutrophils: 64 %
Platelets: 218 10*3/uL (ref 150–450)
RBC: 5.08 x10E6/uL (ref 4.14–5.80)
RDW: 13.6 % (ref 11.6–15.4)
WBC: 6.7 10*3/uL (ref 3.4–10.8)

## 2019-07-03 ENCOUNTER — Ambulatory Visit: Admission: RE | Admit: 2019-07-03 | Payer: BC Managed Care – PPO | Source: Ambulatory Visit

## 2019-08-22 DIAGNOSIS — E1142 Type 2 diabetes mellitus with diabetic polyneuropathy: Secondary | ICD-10-CM | POA: Diagnosis not present

## 2019-08-22 DIAGNOSIS — Z794 Long term (current) use of insulin: Secondary | ICD-10-CM | POA: Diagnosis not present

## 2019-08-22 DIAGNOSIS — E1159 Type 2 diabetes mellitus with other circulatory complications: Secondary | ICD-10-CM | POA: Diagnosis not present

## 2019-08-22 DIAGNOSIS — E1169 Type 2 diabetes mellitus with other specified complication: Secondary | ICD-10-CM | POA: Diagnosis not present

## 2019-08-22 LAB — HEMOGLOBIN A1C: Hemoglobin A1C: 6.1

## 2019-12-17 ENCOUNTER — Other Ambulatory Visit: Payer: Self-pay | Admitting: Family Medicine

## 2019-12-17 ENCOUNTER — Encounter: Payer: Self-pay | Admitting: Family Medicine

## 2019-12-30 ENCOUNTER — Other Ambulatory Visit: Payer: Self-pay

## 2019-12-30 ENCOUNTER — Emergency Department (HOSPITAL_COMMUNITY)
Admission: EM | Admit: 2019-12-30 | Discharge: 2019-12-30 | Disposition: A | Discharge status: Home / Self Care | Payer: BC Managed Care – PPO | Attending: Emergency Medicine | Admitting: Emergency Medicine

## 2019-12-30 ENCOUNTER — Encounter (HOSPITAL_COMMUNITY): Payer: Self-pay

## 2019-12-30 DIAGNOSIS — E162 Hypoglycemia, unspecified: Secondary | ICD-10-CM | POA: Insufficient documentation

## 2019-12-30 DIAGNOSIS — Z96641 Presence of right artificial hip joint: Secondary | ICD-10-CM | POA: Diagnosis not present

## 2019-12-30 DIAGNOSIS — Z794 Long term (current) use of insulin: Secondary | ICD-10-CM | POA: Diagnosis not present

## 2019-12-30 DIAGNOSIS — I447 Left bundle-branch block, unspecified: Secondary | ICD-10-CM | POA: Diagnosis not present

## 2019-12-30 DIAGNOSIS — I10 Essential (primary) hypertension: Secondary | ICD-10-CM | POA: Diagnosis not present

## 2019-12-30 DIAGNOSIS — R61 Generalized hyperhidrosis: Secondary | ICD-10-CM | POA: Diagnosis not present

## 2019-12-30 DIAGNOSIS — E1140 Type 2 diabetes mellitus with diabetic neuropathy, unspecified: Secondary | ICD-10-CM | POA: Diagnosis not present

## 2019-12-30 DIAGNOSIS — R0902 Hypoxemia: Secondary | ICD-10-CM | POA: Diagnosis not present

## 2019-12-30 DIAGNOSIS — Z79899 Other long term (current) drug therapy: Secondary | ICD-10-CM | POA: Insufficient documentation

## 2019-12-30 DIAGNOSIS — Z7984 Long term (current) use of oral hypoglycemic drugs: Secondary | ICD-10-CM | POA: Insufficient documentation

## 2019-12-30 DIAGNOSIS — E161 Other hypoglycemia: Secondary | ICD-10-CM | POA: Diagnosis not present

## 2019-12-30 DIAGNOSIS — E11649 Type 2 diabetes mellitus with hypoglycemia without coma: Secondary | ICD-10-CM | POA: Diagnosis not present

## 2019-12-30 LAB — CBC WITH DIFFERENTIAL/PLATELET
Abs Immature Granulocytes: 0.06 10*3/uL (ref 0.00–0.07)
Basophils Absolute: 0.1 10*3/uL (ref 0.0–0.1)
Basophils Relative: 1 %
Eosinophils Absolute: 0.1 10*3/uL (ref 0.0–0.5)
Eosinophils Relative: 1 %
HCT: 38.6 % — ABNORMAL LOW (ref 39.0–52.0)
Hemoglobin: 13 g/dL (ref 13.0–17.0)
Immature Granulocytes: 1 %
Lymphocytes Relative: 10 %
Lymphs Abs: 1 10*3/uL (ref 0.7–4.0)
MCH: 29 pg (ref 26.0–34.0)
MCHC: 33.7 g/dL (ref 30.0–36.0)
MCV: 86.2 fL (ref 80.0–100.0)
Monocytes Absolute: 1.1 10*3/uL — ABNORMAL HIGH (ref 0.1–1.0)
Monocytes Relative: 11 %
Neutro Abs: 7.6 10*3/uL (ref 1.7–7.7)
Neutrophils Relative %: 76 %
Platelets: 224 10*3/uL (ref 150–400)
RBC: 4.48 MIL/uL (ref 4.22–5.81)
RDW: 13 % (ref 11.5–15.5)
WBC: 9.8 10*3/uL (ref 4.0–10.5)
nRBC: 0 % (ref 0.0–0.2)

## 2019-12-30 LAB — COMPREHENSIVE METABOLIC PANEL
ALT: 55 U/L — ABNORMAL HIGH (ref 0–44)
AST: 48 U/L — ABNORMAL HIGH (ref 15–41)
Albumin: 3.5 g/dL (ref 3.5–5.0)
Alkaline Phosphatase: 29 U/L — ABNORMAL LOW (ref 38–126)
Anion gap: 7 (ref 5–15)
BUN: 20 mg/dL (ref 8–23)
CO2: 27 mmol/L (ref 22–32)
Calcium: 9.2 mg/dL (ref 8.9–10.3)
Chloride: 104 mmol/L (ref 98–111)
Creatinine, Ser: 1.53 mg/dL — ABNORMAL HIGH (ref 0.61–1.24)
GFR, Estimated: 51 mL/min — ABNORMAL LOW (ref >60)
Glucose, Bld: 76 mg/dL (ref 70–99)
Potassium: 3.5 mmol/L (ref 3.5–5.1)
Sodium: 138 mmol/L (ref 135–145)
Total Bilirubin: 1.5 mg/dL — ABNORMAL HIGH (ref 0.3–1.2)
Total Protein: 6.5 g/dL (ref 6.5–8.1)

## 2019-12-30 LAB — CBG MONITORING, ED: Glucose-Capillary: 135 mg/dL — ABNORMAL HIGH (ref 70–99)

## 2019-12-30 NOTE — ED Provider Notes (Signed)
[facility] EMERGENCY DEPARTMENT Provider Note   CSN: [id number] Arrival date & time: 12/30/19  1929     History Chief Complaint  Patient presents with  . Hypoglycemia    Howard Young is a 59 y.o. male.  The history is provided by the patient, medical records and the EMS personnel.  Hypoglycemia  Howard Young is a 59 y.o. male who presents to the Emergency Department complaining of diaphoresis.  Hx is provided by EMS and the patient.  He was driving to work and felt unwell, flushed, diaphoretic.  He pulled to the side of the road and called 911.  While waiting he ate a few ding dongs thinking his blood sugar was low.  On EMS arrival his blood sugar was 58.  He received 15g of D10 by EMS.  On ED arrival he feels well.    Takes trujeo 36 u daily and 1000 mg metformin bid for DM.  He took them around 530 this evening.  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  Had breakfast this morning at 730 (cereal).     Denies fevers, chest pain, sob, nausea, vomiting, diarrhea, dysuria.  No recent changes to medications.      Past Medical History:  Diagnosis Date  . Diabetes mellitus without complication ([place])   . GERD (gastroesophageal reflux disease)   . Hypertension   . Morbid obesity ([place])   . Sleep apnea     Patient Active Problem List   Diagnosis Date Noted  . Diabetic retinopathy ([place]) 02/28/2018  . Fournier gangrene 11/09/2016  . PVC (premature ventricular contraction) 08/16/2015  . Anxiety 09/15/2014  . Elevated CK 09/15/2014  . Essential hypertension 09/15/2014  . Hypertriglyceridemia 09/15/2014  . Hypotestosteronism 09/15/2014  . NASH (nonalcoholic steatohepatitis) 09/15/2014  . PE (pulmonary embolism) 09/15/2014  . Diabetes mellitus with diabetic neuropathy ([place]) 09/15/2014  . Apnea, sleep 09/15/2014  . Male hypogonadism 06/10/2013  . Metabolic syndrome 41/96/2229  . Complication of gastric banding-insufficient weight loss 11/29/2011  .  Obesity-BMI 41 03/31/2011  . Status post gastric banding-APL 2008 03/31/2011  . WPW (Wolff-Parkinson-White syndrome)-cardiac ablation 1993 03/31/2011  . S/P cholecystectomy-2001 03/31/2011    Past Surgical History:  Procedure Laterality Date  . BREATH TEK H PYLORI  04/25/2011   Procedure: BREATH TEK H PYLORI;  Surgeon: Pedro Earls, MD;  Location: [facility];  Service: General;  Laterality: N/A;  . CHOLECYSTECTOMY  2001  . heart ablation  1993  . JOINT REPLACEMENT  2012   right hip  . LAPAROSCOPIC GASTRIC BANDING  2008  . SCROTAL EXPLORATION N/A 11/09/2016   Procedure: SCROTUM EXPLORATION;  Surgeon: Nickie Retort, MD;  Location: [facility] ORS;  Service: Urology;  Laterality: N/A;       Family History  Problem Relation Age of Onset  . Cancer Mother        breast, lung  . Diabetes Mother   . Heart failure Mother   . Cancer Father        colon  . Cancer Sister        breast  . Cancer Sister        ovarian    Social History   Tobacco Use  . Smoking status: Never Smoker  . Smokeless tobacco: Never Used  Vaping Use  . Vaping Use: Never used  Substance Use Topics  . Alcohol use: No  . Drug use: No    Home Medications Prior to Admission medications   Medication Sig  Start Date End Date Taking? Authorizing Provider  Continuous Blood Gluc Sensor (FREESTYLE LIBRE 2 SENSOR) MISC USE 1 KIT EVERY 14 (FOURTEEN) DAYS FOR GLUCOSE MONITORING 05/31/19   [provider]  gabapentin (NEURONTIN) 100 MG capsule Take 1 capsule by mouth 2 (two) times daily. 11/26/17   [provider]  lisinopril-hydrochlorothiazide (ZESTORETIC) 20-12.5 MG tablet Take by mouth. 04/21/19   [provider]  metFORMIN (GLUCOPHAGE) 500 MG tablet Take 500 mg by mouth 2 (two) times daily with a meal.    [provider]  metoprolol succinate (TOPROL-XL) 50 MG 24 hr tablet TAKE 1 TABLET BY MOUTH EVERY DAY WITH OR IMMEDIATELY FOLLOWING FOOD 12/17/19   Birdie Sons, MD    OZEMPIC, 1 MG/DOSE, 2 MG/1.5ML SOPN SMARTSIG:0.75 Milliliter(s) SUB-Q Once a Week 01/04/19   [provider]  tamsulosin (FLOMAX) 0.4 MG CAPS capsule TAKE 1 CAPSULE BY MOUTH EVERY DAY 12/17/19   Birdie Sons, MD  TOUJEO SOLOSTAR 300 UNIT/ML Solostar Pen Inject 50 Units into the skin. 03/20/19   [provider]  insulin aspart (NOVOLOG) 100 UNIT/ML injection Inject 60 Units into the skin at bedtime. Patient taking 30 - 60 units daily   04/25/11  [provider]    Allergies    Metoclopramide hcl and Glipizide  Review of Systems   Review of Systems  All other systems reviewed and are negative.   Physical Exam Updated Vital Signs BP 120/76   Pulse 60   Temp 98 F (36.7 C) (Oral)   Resp 18   SpO2 100%   Physical Exam Vitals and nursing note reviewed.  Constitutional:      Appearance: He is well-developed.  HENT:     Head: Normocephalic and atraumatic.  Cardiovascular:     Rate and Rhythm: Normal rate and regular rhythm.     Heart sounds: No murmur heard.   Pulmonary:     Effort: Pulmonary effort is normal. No respiratory distress.     Breath sounds: Normal breath sounds.  Abdominal:     Palpations: Abdomen is soft.     Tenderness: There is no abdominal tenderness. There is no guarding or rebound.  Musculoskeletal:        General: No swelling or tenderness.  Skin:    General: Skin is warm and dry.  Neurological:     Mental Status: He is alert and oriented to person, place, and time.  Psychiatric:        Behavior: Behavior normal.     ED Results / Procedures / Treatments   Labs (all labs ordered are listed, but only abnormal results are displayed) Labs Reviewed  COMPREHENSIVE METABOLIC PANEL - Abnormal; Notable for the following components:      Result Value   Creatinine, Ser 1.53 (*)    AST 48 (*)    ALT 55 (*)    Alkaline Phosphatase 29 (*)    Total Bilirubin 1.5 (*)    GFR, Estimated 51 (*)    All other components within normal  limits  CBC WITH DIFFERENTIAL/PLATELET - Abnormal; Notable for the following components:   HCT 38.6 (*)    Monocytes Absolute 1.1 (*)    All other components within normal limits  CBG MONITORING, ED - Abnormal; Notable for the following components:   Glucose-Capillary 135 (*)    All other components within normal limits    EKG EKG Interpretation  Date/Time:  Tuesday December 30 2019 20:11:33 EST Ventricular Rate:  65 PR Interval:  QRS Duration: 129 QT Interval:  421 QTC Calculation: 438 R Axis:   -36 Text Interpretation: Sinus rhythm Left bundle branch block Confirmed by Quintella Reichert [phone]) on 12/30/2019 8:15:41 PM   Radiology No results found.  Procedures Procedures (including critical care time)  Medications Ordered in ED Medications - No data to display  ED Course  I have reviewed the triage vital signs and the nursing notes.  Pertinent labs & imaging results that were available during my care of the patient were reviewed by me and considered in my medical decision making (see chart for details).    MDM Rules/Calculators/A&P                         patient here for evaluation following hypoglycemic episode with associated diaphoresis and lightheadedness. His blood sugar improved with EMS intervention. He is asymptomatic during his ED stay. No recurrent hypoglycemia in the emergency department. Labs with stable renal insufficiency. No historical evidence or exam evidence of acute infectious process. Discussed with patient home care for hyperglycemia. Discussed outpatient follow-up and return precautions.  Final Clinical Impression(s) / ED Diagnoses Final diagnoses:  Hypoglycemia    Rx / DC Orders ED Discharge Orders    None       Quintella Reichert, MD 12/30/19 2247

## 2019-12-30 NOTE — ED Notes (Signed)
Discharge instructions provided to patient and family. Verbalized understanding. Alert and oriented. IV lock removed. Ambulated with steady gait out of ED with family.

## 2019-12-30 NOTE — ED Triage Notes (Signed)
Patient arrives via ems with c/o diaphoresis and lightheaded while driving. Hx of DM. Upon ems arrival, BS 58 after patient ate 2 ding dongs. EMS administered 15g D10 with improvement of cbg tro 138. Patient reports relief in symptoms.

## 2019-12-31 LAB — CBG MONITORING, ED: Glucose-Capillary: 96 mg/dL (ref 70–99)

## 2020-01-05 DIAGNOSIS — T8484XA Pain due to internal orthopedic prosthetic devices, implants and grafts, initial encounter: Secondary | ICD-10-CM | POA: Diagnosis not present

## 2020-01-06 NOTE — Progress Notes (Signed)
Established patient visit   Patient: Howard Young   DOB: 1960-06-18   59 y.o. Male  MRN: [id number] Visit Date: 01/07/2020  Today's healthcare provider: Lelon Huh, MD   Chief Complaint  Patient presents with  . Diabetes  . Hypertension  . Follow-up   Subjective    HPI  Hypertension, follow-up  BP Readings from Last 3 Encounters:  01/07/20 (!) 161/84  12/30/19 120/76  06/23/19 124/72   Wt Readings from Last 3 Encounters:  01/07/20 (!) 316 lb (143.3 kg)  06/23/19 (!) 311 lb 12.8 oz (141.4 kg)  03/12/18 290 lb 12.8 oz (131.9 kg)     He was last seen for hypertension 03/12/2018 (seen by Carles Collet, PA-C).   BP at that visit was 135/84. Management since that visit includes continuing same medication.Marland Kitchen  He reports fair compliance with treatment. He is not having side effects.  He is following a Regular diet. He is not exercising. He does not smoke.  Use of agents associated with hypertension: none.   Outside blood pressures are not checked. Symptoms: No chest pain No chest pressure  No palpitations No syncope  No dyspnea No orthopnea  No paroxysmal nocturnal dyspnea No lower extremity edema   Pertinent labs: Lab Results  Component Value Date   CHOL 165 02/04/2018   HDL 58 02/04/2018   LDLCALC 88 02/04/2018   TRIG 97 02/04/2018   CHOLHDL 2.8 02/04/2018   Lab Results  Component Value Date   NA 138 12/30/2019   K 3.5 12/30/2019   CREATININE 1.53 (H) 12/30/2019   GFRNONAA 51 (L) 12/30/2019   GFRAA >60 02/04/2018   GLUCOSE 76 12/30/2019     The 10-year ASCVD risk score Mikey Bussing DC Jr., et al., 2013) is: 22.4%   ---------------------------------------------------------------------------------------------------  Diabetes: Follow by Endocrinology. Dr. Honor Junes. -Patient was recently seen at [facility] ER on 12/30/2019 for Hypoglycemia. Apparently he was commuting when he started feeling weak and diaphoretic. He did not have his glucometer. He  pulled over to eat a sweat snack and called EMS. His sugars was 58 by the time paramedics arrived.  Patients was not able to schedule follow up with Endocrinology until 03/01/2020. Since the ER visit patient denies any further episodes of hypoglycemia. Patient reports his average blood sugars have been around 140.  He states he has been on Ozempic and Toujeo until he stopped Toujeo a few months ago since his sugars were very good. He then stopped Ozempic earlier this month because he didn't think it was making any difference with his blood sugars and not helping him lose weight. But his sugars sugars started getting back up in the upper 100s so he started back on previous daily dose of 34 units of Toujeo about 2 weeks ago. He sugars had been in the low to mid    Patient also states he works third shift and has a hard time staying awake during his work shift. He sleeps fairly well during the day. He was prescribed Nuvigil several years ago which he round to be effective and would like to start back on that medication.   Patient also has history of multiple colon adenomas removed by Dr. Donnella Sham in 2012, but he has not returned for recommended follow up colonoscopy. He is interested in referral to get this up to day.     Medications: Outpatient Medications Prior to Visit  Medication Sig  . Continuous Blood Gluc Sensor (FREESTYLE LIBRE 2 SENSOR) MISC USE 1 KIT  EVERY 14 (FOURTEEN) DAYS FOR GLUCOSE MONITORING  . gabapentin (NEURONTIN) 100 MG capsule Take 1 capsule by mouth 2 (two) times daily.  Marland Kitchen lisinopril-hydrochlorothiazide (ZESTORETIC) 20-12.5 MG tablet Take by mouth.  . metFORMIN (GLUCOPHAGE) 500 MG tablet Take 500 mg by mouth 2 (two) times daily with a meal.  . metoprolol succinate (TOPROL-XL) 50 MG 24 hr tablet TAKE 1 TABLET BY MOUTH EVERY DAY WITH OR IMMEDIATELY FOLLOWING FOOD  . tamsulosin (FLOMAX) 0.4 MG CAPS capsule TAKE 1 CAPSULE BY MOUTH EVERY DAY  . TOUJEO SOLOSTAR 300 UNIT/ML Solostar  Pen Inject 32 Units into the skin.   Marland Kitchen OZEMPIC, 1 MG/DOSE, 2 MG/1.5ML SOPN SMARTSIG:0.75 Milliliter(s) SUB-Q Once a Week (Patient not taking: Reported on 01/07/2020)   No facility-administered medications prior to visit.    Review of Systems  Constitutional: Negative for appetite change, chills and fever.  Respiratory: Negative for chest tightness, shortness of breath and wheezing.   Cardiovascular: Negative for chest pain and palpitations.  Gastrointestinal: Negative for abdominal pain, nausea and vomiting.     Objective    BP (!) 161/84 (BP Location: Right Arm, Patient Position: Sitting, Cuff Size: Large)   Pulse (!) 54   Temp 98.1 F (36.7 C) (Oral)   Resp 16   Wt (!) 316 lb (143.3 kg)   BMI 38.46 kg/m   Physical Exam   General: Appearance:    Obese male in no acute distress  Eyes:    PERRL, conjunctiva/corneas clear, EOM's intact       Lungs:     Clear to auscultation bilaterally, respirations unlabored  Heart:    Bradycardic. Normal rhythm. No murmurs, rubs, or gallops.   MS:   All extremities are intact.   Neurologic:   Awake, alert, oriented x 3. No apparent focal neurological           defect.        Results for orders placed or performed in visit on 01/07/20  POCT HgB A1C  Result Value Ref Range   Hemoglobin A1C 7.6 (A) 4.0 - 5.6 %   Est. average glucose Bld gHb Est-mCnc 171       Assessment & Plan     1. Diabetes mellitus due to underlying condition with diabetic neuropathy, unspecified whether long term insulin use ([place])   2. Diabetes mellitus due to underlying condition with hypoglycemia without coma, with long-term current use of insulin ([place]) Long discussion regarding risk of hypoglycemia on insulin and importance of glucose monitoring and having a consistent diet. He also tends to take it upon himself to stop and start his medications. His A1c is up somewhat, but was off of insulin for several weeks. Will cut back to 28 units and given titration  instructions with goal of keeping fasting sugars 120-200 to reduce hypoglycemia risk. He does not want to start back on Ozempic at this time. He is to keep follow up appt with Dr. Honor Junes in January. Advised that he is due for lipid panel which he wants to have done at Dr. De Burrs appts.   3. History of adenomatous polyp of colon Over due for follow up colonoscopy, previously a patient of Dr. Donnella Sham- Ambulatory referral to gastroenterology for colonoscopy  4. Colon cancer screening  - Ambulatory referral to gastroenterology for colonoscopy  5. Circadian rhythm sleep disorder, shift work type Did well with Nuvigil previously - Armodafinil (NUVIGIL) 150 MG tablet; Take 1 tablet (150 mg total) by mouth daily.  Dispense: 30 tablet; Refill: 3  6.  Need for influenza vaccination  - Flu Vaccine QUAD 36+ mos IM (Fluarix/Fluzone)   7. Fecal urgency This may be related to cholecystectomy status, or metformin, or both. He is going to try OTC powdered fiber supplement. Counseled that we could try cholestyramine, and he could discuss extended release metformin with Dr. Honor Junes.    Addressed extensive list of chronic and acute medical problems today requiring 45 minutes reviewing his medical record, counseling patient regarding his conditions and coordination of care.         The entirety of the information documented in the History of Present Illness, Review of Systems and Physical Exam were personally obtained by me. Portions of this information were initially documented by the CMA and reviewed by me for thoroughness and accuracy.      Lelon Huh, MD  [facility] [phone] (phone) [phone] (fax)  [place]

## 2020-01-07 ENCOUNTER — Other Ambulatory Visit: Payer: Self-pay

## 2020-01-07 ENCOUNTER — Encounter: Payer: Self-pay | Admitting: Family Medicine

## 2020-01-07 ENCOUNTER — Ambulatory Visit (INDEPENDENT_AMBULATORY_CARE_PROVIDER_SITE_OTHER): Payer: BC Managed Care – PPO | Admitting: Family Medicine

## 2020-01-07 VITALS — BP 166/86 | HR 54 | Temp 98.1°F | Resp 16 | Wt 316.0 lb

## 2020-01-07 DIAGNOSIS — Z1211 Encounter for screening for malignant neoplasm of colon: Secondary | ICD-10-CM | POA: Diagnosis not present

## 2020-01-07 DIAGNOSIS — Z23 Encounter for immunization: Secondary | ICD-10-CM | POA: Diagnosis not present

## 2020-01-07 DIAGNOSIS — E08649 Diabetes mellitus due to underlying condition with hypoglycemia without coma: Secondary | ICD-10-CM

## 2020-01-07 DIAGNOSIS — Z86010 Personal history of colonic polyps: Secondary | ICD-10-CM | POA: Diagnosis not present

## 2020-01-07 DIAGNOSIS — E0840 Diabetes mellitus due to underlying condition with diabetic neuropathy, unspecified: Secondary | ICD-10-CM | POA: Diagnosis not present

## 2020-01-07 DIAGNOSIS — G4726 Circadian rhythm sleep disorder, shift work type: Secondary | ICD-10-CM

## 2020-01-07 DIAGNOSIS — R152 Fecal urgency: Secondary | ICD-10-CM

## 2020-01-07 DIAGNOSIS — Z794 Long term (current) use of insulin: Secondary | ICD-10-CM

## 2020-01-07 LAB — POCT GLYCOSYLATED HEMOGLOBIN (HGB A1C)
Est. average glucose Bld gHb Est-mCnc: 171
Hemoglobin A1C: 7.6 % — AB (ref 4.0–5.6)

## 2020-01-07 MED ORDER — ARMODAFINIL 150 MG PO TABS: 150.0000 mg | ORAL_TABLET | Freq: Every day | ORAL | 3 refills | Status: DC

## 2020-01-07 NOTE — Patient Instructions (Addendum)
.   Please review the attached list of medications and notify my office if there are any errors.   . Reduce the Toujeo to 28 units a day. Check your fasting sugars every day. Increase Toujeo by 2 units a day if your fasting sugar is over 200. Increase Toujeo 1 unit per day every day your fasting sugar is between 120 and 199.    You are due for a follow up colonoscopy due to having pre-cancerous colon polyps in the past. You should get a call from [facility] to schedule this in the next few weeks.    Call [facility] at [phone]

## 2020-01-14 ENCOUNTER — Other Ambulatory Visit: Payer: Self-pay

## 2020-01-14 ENCOUNTER — Telehealth (INDEPENDENT_AMBULATORY_CARE_PROVIDER_SITE_OTHER): Payer: Self-pay | Admitting: Gastroenterology

## 2020-01-14 DIAGNOSIS — Z86010 Personal history of colonic polyps: Secondary | ICD-10-CM

## 2020-01-14 MED ORDER — NA SULFATE-K SULFATE-MG SULF 17.5-3.13-1.6 GM/177ML PO SOLN: 1.0000 | Freq: Once | ORAL | 0 refills | Status: AC

## 2020-01-14 NOTE — Progress Notes (Signed)
Gastroenterology Pre-Procedure Review  Request Date: Monday 03/08/20 Requesting Physician: Dr. Marius Ditch  PATIENT REVIEW QUESTIONS: The patient responded to the following health history questions as indicated:    1. Are you having any GI issues? yes (Diarrhea after eating.  Pt states he has discussed with PCP.  States that it may be due to taking Metformin and his intermittend fasting.) 2. Do you have a personal history of Polyps? yes (05/23/10 colonoscopy performed by Dr. Angelica Chessman) 3. Do you have a family history of Colon Cancer or Polyps? yes (father may have had colon cancer) 4. Diabetes Mellitus? yes (type 2) 5. Joint replacements in the past 12 months?no 6. Major health problems in the past 3 months?yes (ER Visit Low Blood Sugar) 7. Any artificial heart valves, MVP, or defibrillator?no    MEDICATIONS & ALLERGIES:    Patient reports the following regarding taking any anticoagulation/antiplatelet therapy:   Plavix, Coumadin, Eliquis, Xarelto, Lovenox, Pradaxa, Brilinta, or Effient? no Aspirin? no  Patient confirms/reports the following medications:  Current Outpatient Medications  Medication Sig Dispense Refill  . Armodafinil (NUVIGIL) 150 MG tablet Take 1 tablet (150 mg total) by mouth daily. 30 tablet 3  . B-D UF III MINI PEN NEEDLES 31G X 5 MM MISC SMARTSIG:1 Each SUB-Q Twice Daily    . Continuous Blood Gluc Sensor (FREESTYLE LIBRE 2 SENSOR) MISC USE 1 KIT EVERY 14 (FOURTEEN) DAYS FOR GLUCOSE MONITORING    . Insulin Pen Needle (B-D UF III MINI PEN NEEDLES) 31G X 5 MM MISC See admin instructions.    Marland Kitchen lisinopril-hydrochlorothiazide (ZESTORETIC) 20-12.5 MG tablet Take by mouth.    . metFORMIN (GLUCOPHAGE) 500 MG tablet Take 500 mg by mouth 2 (two) times daily with a meal.    . metoprolol succinate (TOPROL-XL) 50 MG 24 hr tablet TAKE 1 TABLET BY MOUTH EVERY DAY WITH OR IMMEDIATELY FOLLOWING FOOD 90 tablet 0  . tamsulosin (FLOMAX) 0.4 MG CAPS capsule TAKE 1 CAPSULE BY MOUTH EVERY DAY 90  capsule 0  . TOUJEO SOLOSTAR 300 UNIT/ML Solostar Pen Inject 32 Units into the skin.     Marland Kitchen gabapentin (NEURONTIN) 100 MG capsule Take 1 capsule by mouth 2 (two) times daily. (Patient not taking: Reported on 01/14/2020)     No current facility-administered medications for this visit.    Patient confirms/reports the following allergies:  Allergies  Allergen Reactions  . Metoclopramide Hcl Shortness Of Breath and Anxiety  . Glipizide     Profound weigh gain    No orders of the defined types were placed in this encounter.   AUTHORIZATION INFORMATION Primary Insurance: 1D#: Group #:  Secondary Insurance: 1D#: Group #:  SCHEDULE INFORMATION: Date:  Monday 03/08/20 Time: Location:[facility]

## 2020-01-21 ENCOUNTER — Ambulatory Visit: Payer: BC Managed Care – PPO | Admitting: Physician Assistant

## 2020-01-21 ENCOUNTER — Encounter: Payer: Self-pay | Admitting: Physician Assistant

## 2020-01-21 ENCOUNTER — Other Ambulatory Visit: Payer: Self-pay

## 2020-01-21 VITALS — BP 143/76 | HR 66 | Temp 97.6°F | Wt 309.6 lb

## 2020-01-21 DIAGNOSIS — Z23 Encounter for immunization: Secondary | ICD-10-CM | POA: Diagnosis not present

## 2020-01-21 DIAGNOSIS — B379 Candidiasis, unspecified: Secondary | ICD-10-CM

## 2020-01-21 MED ORDER — CLOTRIMAZOLE 1 % EX CREA: 1.0000 "application " | TOPICAL_CREAM | Freq: Two times a day (BID) | CUTANEOUS | 1 refills | Status: DC

## 2020-01-21 NOTE — Progress Notes (Signed)
Established patient visit   Patient: Howard Young   DOB: 19-Jun-1960   59 y.o. Male  MRN: [id number] Visit Date: 01/21/2020  Today's healthcare provider: Trinna Post, PA-C   Chief Complaint  Patient presents with  . Rash   Subjective    Rash This is a new problem. The current episode started in the past 7 days. The problem is unchanged. The affected locations include the abdomen and groin. The rash is characterized by itchiness, pain, redness, burning and draining. He was exposed to nothing. Past treatments include anti-itch cream and moisturizer. The treatment provided mild relief.         Medications: Outpatient Medications Prior to Visit  Medication Sig  . Armodafinil (NUVIGIL) 150 MG tablet Take 1 tablet (150 mg total) by mouth daily.  . B-D UF III MINI PEN NEEDLES 31G X 5 MM MISC SMARTSIG:1 Each SUB-Q Twice Daily  . Continuous Blood Gluc Sensor (FREESTYLE LIBRE 2 SENSOR) MISC USE 1 KIT EVERY 14 (FOURTEEN) DAYS FOR GLUCOSE MONITORING  . gabapentin (NEURONTIN) 100 MG capsule Take 1 capsule by mouth 2 (two) times daily.   . Insulin Pen Needle (B-D UF III MINI PEN NEEDLES) 31G X 5 MM MISC See admin instructions.  Marland Kitchen lisinopril-hydrochlorothiazide (ZESTORETIC) 20-12.5 MG tablet Take by mouth.  . metFORMIN (GLUCOPHAGE) 500 MG tablet Take 500 mg by mouth 2 (two) times daily with a meal.  . metoprolol succinate (TOPROL-XL) 50 MG 24 hr tablet TAKE 1 TABLET BY MOUTH EVERY DAY WITH OR IMMEDIATELY FOLLOWING FOOD  . tamsulosin (FLOMAX) 0.4 MG CAPS capsule TAKE 1 CAPSULE BY MOUTH EVERY DAY  . TOUJEO SOLOSTAR 300 UNIT/ML Solostar Pen Inject 32 Units into the skin.    No facility-administered medications prior to visit.    Review of Systems  Skin: Positive for rash.      Objective    BP (!) 143/76 (BP Location: Left Arm, Patient Position: Sitting, Cuff Size: Large)   Pulse 66   Temp 97.6 F (36.4 C) (Oral)   Wt (!) 309 lb 9.6 oz (140.4 kg)   SpO2 100%   BMI 37.69  kg/m    Physical Exam Constitutional:      Appearance: Normal appearance.  Cardiovascular:     Rate and Rhythm: Normal rate.  Pulmonary:     Effort: Pulmonary effort is normal.  Skin:    General: Skin is warm and dry.     Findings: Rash present.       Neurological:     Mental Status: He is alert and oriented to person, place, and time. Mental status is at baseline.  Psychiatric:        Mood and Affect: Mood normal.        Behavior: Behavior normal.       No results found for any visits on 01/21/20.  Assessment & Plan    1. Candida infection  Apply cream twice daily for a week. May need to proceed with cream for a few weeks. May need to consider diflucan as the rash does appear more severe.   - clotrimazole (LOTRIMIN) 1 % cream; Apply 1 application topically 2 (two) times daily.  Dispense: 113 g; Refill: 1   No follow-ups on file.      ITrinna Post, PA-C, have reviewed all documentation for this visit. The documentation on 01/21/20 for the exam, diagnosis, procedures, and orders are all accurate and complete.  The entirety of the information documented in the History  of Present Illness, Review of Systems and Physical Exam were personally obtained by me. Portions of this information were initially documented by [facility] and reviewed by me for thoroughness and accuracy.     Paulene Floor  [facility] [phone] (phone) [phone] (fax)  [place]

## 2020-01-21 NOTE — Patient Instructions (Signed)

## 2020-01-26 DIAGNOSIS — B372 Candidiasis of skin and nail: Secondary | ICD-10-CM | POA: Diagnosis not present

## 2020-02-22 ENCOUNTER — Encounter: Payer: Self-pay | Admitting: Family Medicine

## 2020-03-02 DIAGNOSIS — B372 Candidiasis of skin and nail: Secondary | ICD-10-CM | POA: Diagnosis not present

## 2020-03-04 ENCOUNTER — Other Ambulatory Visit: Payer: Self-pay

## 2020-03-04 ENCOUNTER — Other Ambulatory Visit
Admission: RE | Admit: 2020-03-04 | Discharge: 2020-03-04 | Disposition: A | Discharge status: Home / Self Care | Payer: BC Managed Care – PPO | Source: Ambulatory Visit | Attending: Gastroenterology | Admitting: Gastroenterology

## 2020-03-04 DIAGNOSIS — Z01812 Encounter for preprocedural laboratory examination: Secondary | ICD-10-CM | POA: Insufficient documentation

## 2020-03-04 DIAGNOSIS — Z20822 Contact with and (suspected) exposure to covid-19: Secondary | ICD-10-CM | POA: Insufficient documentation

## 2020-03-04 LAB — SARS CORONAVIRUS 2 (TAT 6-24 HRS): SARS Coronavirus 2: NEGATIVE

## 2020-03-08 ENCOUNTER — Ambulatory Visit: Payer: BC Managed Care – PPO | Admitting: Anesthesiology

## 2020-03-08 ENCOUNTER — Ambulatory Visit
Admission: RE | Admit: 2020-03-08 | Discharge: 2020-03-08 | Disposition: A | Discharge status: Home / Self Care | Payer: BC Managed Care – PPO | Attending: Gastroenterology | Admitting: Gastroenterology

## 2020-03-08 ENCOUNTER — Encounter
Admission: RE | Disposition: A | Discharge status: Home / Self Care | Payer: Self-pay | Source: Home / Self Care | Attending: Gastroenterology

## 2020-03-08 ENCOUNTER — Other Ambulatory Visit: Payer: Self-pay

## 2020-03-08 DIAGNOSIS — D122 Benign neoplasm of ascending colon: Secondary | ICD-10-CM | POA: Diagnosis not present

## 2020-03-08 DIAGNOSIS — K2930 Chronic superficial gastritis without bleeding: Secondary | ICD-10-CM | POA: Diagnosis not present

## 2020-03-08 DIAGNOSIS — D124 Benign neoplasm of descending colon: Secondary | ICD-10-CM | POA: Insufficient documentation

## 2020-03-08 DIAGNOSIS — Z79899 Other long term (current) drug therapy: Secondary | ICD-10-CM | POA: Diagnosis not present

## 2020-03-08 DIAGNOSIS — K2270 Barrett's esophagus without dysplasia: Secondary | ICD-10-CM | POA: Diagnosis not present

## 2020-03-08 DIAGNOSIS — Z8719 Personal history of other diseases of the digestive system: Secondary | ICD-10-CM | POA: Diagnosis not present

## 2020-03-08 DIAGNOSIS — K3189 Other diseases of stomach and duodenum: Secondary | ICD-10-CM | POA: Insufficient documentation

## 2020-03-08 DIAGNOSIS — Z86010 Personal history of colonic polyps: Secondary | ICD-10-CM | POA: Insufficient documentation

## 2020-03-08 DIAGNOSIS — Z1211 Encounter for screening for malignant neoplasm of colon: Secondary | ICD-10-CM | POA: Insufficient documentation

## 2020-03-08 DIAGNOSIS — D128 Benign neoplasm of rectum: Secondary | ICD-10-CM | POA: Diagnosis not present

## 2020-03-08 DIAGNOSIS — K635 Polyp of colon: Secondary | ICD-10-CM

## 2020-03-08 DIAGNOSIS — Z8041 Family history of malignant neoplasm of ovary: Secondary | ICD-10-CM | POA: Diagnosis not present

## 2020-03-08 DIAGNOSIS — K621 Rectal polyp: Secondary | ICD-10-CM | POA: Diagnosis not present

## 2020-03-08 DIAGNOSIS — Z794 Long term (current) use of insulin: Secondary | ICD-10-CM | POA: Insufficient documentation

## 2020-03-08 DIAGNOSIS — Z803 Family history of malignant neoplasm of breast: Secondary | ICD-10-CM | POA: Insufficient documentation

## 2020-03-08 DIAGNOSIS — Z96641 Presence of right artificial hip joint: Secondary | ICD-10-CM | POA: Diagnosis not present

## 2020-03-08 DIAGNOSIS — K2950 Unspecified chronic gastritis without bleeding: Secondary | ICD-10-CM | POA: Diagnosis not present

## 2020-03-08 DIAGNOSIS — Z13810 Encounter for screening for upper gastrointestinal disorder: Secondary | ICD-10-CM | POA: Insufficient documentation

## 2020-03-08 DIAGNOSIS — Z801 Family history of malignant neoplasm of trachea, bronchus and lung: Secondary | ICD-10-CM | POA: Diagnosis not present

## 2020-03-08 HISTORY — PX: COLONOSCOPY WITH PROPOFOL: SHX5780

## 2020-03-08 HISTORY — PX: ESOPHAGOGASTRODUODENOSCOPY (EGD) WITH PROPOFOL: SHX5813

## 2020-03-08 LAB — GLUCOSE, CAPILLARY: Glucose-Capillary: 141 mg/dL — ABNORMAL HIGH (ref 70–99)

## 2020-03-08 SURGERY — COLONOSCOPY WITH PROPOFOL
Anesthesia: General

## 2020-03-08 MED ORDER — GLYCOPYRROLATE 0.2 MG/ML IJ SOLN
INTRAMUSCULAR | Status: DC | PRN
  Administered 2020-03-08: .2 mg via INTRAVENOUS

## 2020-03-08 MED ORDER — LABETALOL HCL 5 MG/ML IV SOLN
INTRAVENOUS | Status: DC | PRN
  Administered 2020-03-08: 10 mg via INTRAVENOUS

## 2020-03-08 MED ORDER — LIDOCAINE HCL (PF) 2 % IJ SOLN
INTRAMUSCULAR | Status: AC
  Filled 2020-03-08: qty 5

## 2020-03-08 MED ORDER — PROPOFOL 10 MG/ML IV BOLUS
INTRAVENOUS | Status: DC | PRN
  Administered 2020-03-08: 100 mg via INTRAVENOUS

## 2020-03-08 MED ORDER — PROPOFOL 500 MG/50ML IV EMUL
INTRAVENOUS | Status: DC | PRN
  Administered 2020-03-08: 200 ug/kg/min via INTRAVENOUS

## 2020-03-08 MED ORDER — LIDOCAINE 2% (20 MG/ML) 5 ML SYRINGE
INTRAMUSCULAR | Status: DC | PRN
  Administered 2020-03-08: 100 mg via INTRAVENOUS

## 2020-03-08 MED ORDER — SODIUM CHLORIDE 0.9 % IV SOLN
INTRAVENOUS | Status: DC
  Administered 2020-03-08: 20 mL/h via INTRAVENOUS

## 2020-03-08 MED ORDER — GLYCOPYRROLATE 0.2 MG/ML IJ SOLN
INTRAMUSCULAR | Status: AC
  Filled 2020-03-08: qty 1

## 2020-03-08 MED ORDER — PROPOFOL 500 MG/50ML IV EMUL
INTRAVENOUS | Status: AC
  Filled 2020-03-08: qty 50

## 2020-03-08 NOTE — Op Note (Signed)
[facility] Gastroenterology Patient Name: Howard Young Procedure Date: 03/08/2020 8:23 AM MRN: [id number] Account #: [id number] Date of Birth: 07-19-1960 Admit Type: Outpatient Age: 60 Room: [facility] ENDO ROOM 2 Gender: Male Note Status: Finalized Procedure:             Upper GI endoscopy Indications:           Screening for Barrett's esophagus, Screening for                         Barrett's esophagus in patient at risk for this                         condition, Surveillance procedure Providers:             Lin Landsman MD, MD Medicines:             General Anesthesia Complications:         No immediate complications. Estimated blood loss: None. Procedure:             Pre-Anesthesia Assessment:                        - Prior to the procedure, a History and Physical was                         performed, and patient medications and allergies were                         reviewed. The patient is competent. The risks and                         benefits of the procedure and the sedation options and                         risks were discussed with the patient. All questions                         were answered and informed consent was obtained.                         Patient identification and proposed procedure were                         verified by the physician, the nurse, the                         anesthesiologist, the anesthetist and the technician                         in the pre-procedure area in the procedure room in the                         endoscopy suite. Mental Status Examination: alert and                         oriented. Airway Examination: normal oropharyngeal                         airway and neck mobility. Respiratory  Examination:                         clear to auscultation. CV Examination: normal.                         Prophylactic Antibiotics: The patient does not require                         prophylactic antibiotics.  Prior Anticoagulants: The                         patient has taken no previous anticoagulant or                         antiplatelet agents. ASA Grade Assessment: III - A                         patient with severe systemic disease. After reviewing                         the risks and benefits, the patient was deemed in                         satisfactory condition to undergo the procedure. The                         anesthesia plan was to use general anesthesia.                         Immediately prior to administration of medications,                         the patient was re-assessed for adequacy to receive                         sedatives. The heart rate, respiratory rate, oxygen                         saturations, blood pressure, adequacy of pulmonary                         ventilation, and response to care were monitored                         throughout the procedure. The physical status of the                         patient was re-assessed after the procedure.                        After obtaining informed consent, the endoscope was                         passed under direct vision. Throughout the procedure,                         the patient's blood pressure, pulse, and oxygen  saturations were monitored continuously. The Endoscope                         was introduced through the mouth, and advanced to the                         second part of duodenum. The upper GI endoscopy was                         accomplished without difficulty. The patient tolerated                         the procedure well. Findings:      The duodenal bulb and second portion of the duodenum were normal.      Patchy moderately erythematous mucosa without bleeding was found in the       gastric body. Biopsies were taken with a cold forceps for Helicobacter       pylori testing.      The incisura and gastric antrum were normal. Biopsies were taken with a       cold  forceps for Helicobacter pylori testing.      The cardia and gastric fundus were normal on retroflexion.      There were esophageal mucosal changes secondary to established       short-segment Barrett's disease present in the lower third of the       esophagus. The maximum longitudinal extent of these mucosal changes was       1 cm in length. Mucosa was biopsied with a cold forceps for histology.       One specimen bottle was sent to pathology.      Esophagogastric landmarks were identified: the gastroesophageal junction       was found at 45 cm from the incisors. Impression:            - Normal duodenal bulb and second portion of the                         duodenum.                        - Erythematous mucosa in the gastric body. Biopsied.                        - Normal incisura and antrum. Biopsied.                        - Esophageal mucosal changes secondary to established                         short-segment Barrett's disease. Biopsied.                        - Esophagogastric landmarks identified. Recommendation:        - Follow an antireflux regimen for the rest of the                         patient's life.                        - Await pathology results.                        -  Use Prilosec (omeprazole) 20 mg PO daily                         indefinitely.                        - Proceed with colonoscopy as scheduled                        See colonoscopy report Procedure Code(s):     --- Professional ---                        [phone], Esophagogastroduodenoscopy, flexible,                         transoral; with biopsy, single or multiple Diagnosis Code(s):     --- Professional ---                        K22.70, Barrett's esophagus without dysplasia                        K31.89, Other diseases of stomach and duodenum                        Z13.810, Encounter for screening for upper                         gastrointestinal disorder CPT copyright 2019 American Medical  Association. All rights reserved. The codes documented in this report are preliminary and upon coder review may  be revised to meet current compliance requirements. Dr. Ulyess Mort Lin Landsman MD, MD 03/08/2020 8:49:46 AM This report has been signed electronically. Number of Addenda: 0 Note Initiated On: 03/08/2020 8:23 AM Estimated Blood Loss:  Estimated blood loss: none.      [facility]

## 2020-03-08 NOTE — H&P (Signed)
Cephas Darby, MD [address]  [place]  [place], [place] [address]  Main: [phone]  Fax: [phone] Pager: [phone]  Primary Care Physician:  Birdie Sons, MD Primary Gastroenterologist:  Dr. Cephas Darby  Pre-Procedure History & Physical: HPI:  Howard Young is a 60 y.o. male is here for an endoscopy and colonoscopy.   Past Medical History:  Diagnosis Date  . Diabetes mellitus without complication ([place])   . GERD (gastroesophageal reflux disease)   . Hypertension   . Morbid obesity ([place])   . Sleep apnea     Past Surgical History:  Procedure Laterality Date  . BREATH TEK H PYLORI  04/25/2011   Procedure: BREATH TEK H PYLORI;  Surgeon: Pedro Earls, MD;  Location: [facility];  Service: General;  Laterality: N/A;  . CHOLECYSTECTOMY  2001  . heart ablation  1993  . JOINT REPLACEMENT  2012   right hip  . LAPAROSCOPIC GASTRIC BANDING  2008  . SCROTAL EXPLORATION N/A 11/09/2016   Procedure: SCROTUM EXPLORATION;  Surgeon: Nickie Retort, MD;  Location: [facility] ORS;  Service: Urology;  Laterality: N/A;    Prior to Admission medications   Medication Sig Start Date End Date Taking? Authorizing Provider  Armodafinil (NUVIGIL) 150 MG tablet Take 1 tablet (150 mg total) by mouth daily. 01/07/20  Yes Birdie Sons, MD  B-D UF III MINI PEN NEEDLES 31G X 5 MM MISC SMARTSIG:1 Each SUB-Q Twice Daily 08/29/19  Yes [provider]  clotrimazole (LOTRIMIN) 1 % cream Apply 1 application topically 2 (two) times daily. 01/21/20  Yes Pollak, Fabio Bering M, PA-C  Continuous Blood Gluc Sensor (FREESTYLE LIBRE 2 SENSOR) MISC USE 1 KIT EVERY 14 (FOURTEEN) DAYS FOR GLUCOSE MONITORING 05/31/19  Yes [provider]  gabapentin (NEURONTIN) 100 MG capsule Take 1 capsule by mouth 2 (two) times daily.  11/26/17  Yes [provider]  Insulin Pen Needle (B-D UF III MINI PEN NEEDLES) 31G X 5 MM MISC See admin instructions. 07/11/19  Yes [provider]  lisinopril-hydrochlorothiazide (ZESTORETIC) 20-12.5 MG tablet Take by mouth. 04/21/19  Yes [provider]  metFORMIN (GLUCOPHAGE) 500 MG tablet Take 500 mg by mouth 2 (two) times daily with a meal.   Yes [provider]  metoprolol succinate (TOPROL-XL) 50 MG 24 hr tablet TAKE 1 TABLET BY MOUTH EVERY DAY WITH OR IMMEDIATELY FOLLOWING FOOD 12/17/19  Yes Birdie Sons, MD  TOUJEO SOLOSTAR 300 UNIT/ML Solostar Pen Inject 32 Units into the skin.  03/20/19  Yes [provider]  tamsulosin (FLOMAX) 0.4 MG CAPS capsule TAKE 1 CAPSULE BY MOUTH EVERY DAY 12/17/19   Birdie Sons, MD  insulin aspart (NOVOLOG) 100 UNIT/ML injection Inject 60 Units into the skin at bedtime. Patient taking 30 - 60 units daily   04/25/11  [provider]    Allergies as of 01/14/2020 - Review Complete 01/14/2020  Allergen Reaction Noted  . Metoclopramide hcl Shortness Of Breath and Anxiety 03/31/2011  . Glipizide  02/28/2016    Family History  Problem Relation Age of Onset  . Cancer Mother        breast, lung  . Diabetes Mother   . Heart failure Mother   . Cancer Father        colon  . Cancer Sister        breast  . Cancer Sister        ovarian    Social History   Socioeconomic History  .  Marital status: Married    Spouse name: Not on file  . Number of children: Not on file  . Years of education: Not on file  . Highest education level: Not on file  Occupational History  . Not on file  Tobacco Use  . Smoking status: Never Smoker  . Smokeless tobacco: Never Used  Vaping Use  . Vaping Use: Never used  Substance and Sexual Activity  . Alcohol use: No  . Drug use: No  . Sexual activity: Not on file  Other Topics Concern  . Not on file  Social History Narrative  . Not on file   Social Determinants of Health   Financial Resource Strain: Not on file  Food Insecurity: Not on file  Transportation Needs: Not on file  Physical Activity: Not on file   Stress: Not on file  Social Connections: Not on file  Intimate Partner Violence: Not on file    Review of Systems: See HPI, otherwise negative ROS  Physical Exam: BP (!) 207/113   Pulse 62   Temp (!) 97.4 F (36.3 C) (Temporal)   Resp 20   Ht 6' 4" (1.93 m)   Wt 136.1 kg   SpO2 100%   BMI 36.52 kg/m  General:   Alert,  pleasant and cooperative in NAD Head:  Normocephalic and atraumatic. Neck:  Supple; no masses or thyromegaly. Lungs:  Clear throughout to auscultation.    Heart:  Regular rate and rhythm. Abdomen:  Soft, nontender and nondistended. Normal bowel sounds, without guarding, and without rebound.   Neurologic:  Alert and  oriented x4;  grossly normal neurologically.  Impression/Plan: Howard Young is here for an endoscopy and colonoscopy to be performed for h/o barrett's and h/o colon polyps  Risks, benefits, limitations, and alternatives regarding  endoscopy and colonoscopy have been reviewed with the patient.  Questions have been answered.  All parties agreeable.   Rohini Vanga, MD  03/08/2020, 8:31 AM 

## 2020-03-08 NOTE — Transfer of Care (Signed)
Immediate Anesthesia Transfer of Care Note  Patient: ANDRAE CLAUNCH  Procedure(s) Performed: COLONOSCOPY WITH PROPOFOL (N/A ) ESOPHAGOGASTRODUODENOSCOPY (EGD) WITH PROPOFOL  Patient Location: Endoscopy Unit  Anesthesia Type:General  Level of Consciousness: drowsy  Airway & Oxygen Therapy: Patient connected to nasal cannula oxygen  Post-op Assessment: Post -op Vital signs reviewed and stable  Post vital signs: stable  Last Vitals:  Vitals Value Taken Time  BP 152/85 03/08/20 0929  Temp    Pulse 67 03/08/20 0930  Resp 16 03/08/20 0930  SpO2 96 % 03/08/20 0930  Vitals shown include unvalidated device data.  Last Pain:  Vitals:   03/08/20 0736  TempSrc: Temporal  PainSc: 0-No pain         Complications: No complications documented.

## 2020-03-09 ENCOUNTER — Encounter: Payer: Self-pay | Admitting: Gastroenterology

## 2020-03-09 NOTE — Anesthesia Postprocedure Evaluation (Signed)
Anesthesia Post Note  Patient: Howard Young  Procedure(s) Performed: COLONOSCOPY WITH PROPOFOL (N/A ) ESOPHAGOGASTRODUODENOSCOPY (EGD) WITH PROPOFOL  Patient location during evaluation: PACU Anesthesia Type: General Level of consciousness: awake and alert Pain management: pain level controlled Vital Signs Assessment: post-procedure vital signs reviewed and stable Respiratory status: spontaneous breathing, nonlabored ventilation and respiratory function stable Cardiovascular status: blood pressure returned to baseline and stable Postop Assessment: no apparent nausea or vomiting Anesthetic complications: no   No complications documented.   Last Vitals:  Vitals:   03/08/20 0939 03/08/20 0949  BP: (!) 159/85 (!) 156/86  Pulse:  70  Resp:  14  Temp:    SpO2: 95% 98%    Last Pain:  Vitals:   03/08/20 0949  TempSrc:   PainSc: 0-No pain                 Tera Mater

## 2020-03-09 NOTE — Op Note (Signed)
[facility] Patient Name: Howard Young Procedure Date: 03/08/2020 7:58 AM MRN: [id number] Account [id number] Date of Birth: September 07, 1960 Admit Type: Outpatient Age: 60 Room: [facility] ENDO ROOM 2 Gender: Male Note Status: Finalized Procedure:             Colonoscopy Indications:           Surveillance: Personal history of adenomatous polyps                         on last colonoscopy > 5 years ago, Last colonoscopy:                         April 2012 Providers:             Lin Landsman MD, MD Referring MD:          Kirstie Peri. Caryn Section, MD (Referring MD) Medicines:             General Anesthesia Complications:         No immediate complications. Estimated blood loss: None. Procedure:             Pre-Anesthesia Assessment:                        - Prior to the procedure, a History and Physical was                         performed, and patient medications and allergies were                         reviewed. The patient is competent. The risks and                         benefits of the procedure and the sedation options and                         risks were discussed with the patient. All questions                         were answered and informed consent was obtained.                         Patient identification and proposed procedure were                         verified by the physician, the nurse, the                         anesthesiologist, the anesthetist and the technician                         in the pre-procedure area in the procedure room in the                         endoscopy suite. Mental Status Examination: alert and                         oriented. Airway Examination: normal oropharyngeal  airway and neck mobility. Respiratory Examination:                         clear to auscultation. CV Examination: normal.                         Prophylactic Antibiotics: The patient does not require                          prophylactic antibiotics. Prior Anticoagulants: The                         patient has taken no previous anticoagulant or                         antiplatelet agents. ASA Grade Assessment: III - A                         patient with severe systemic disease. After reviewing                         the risks and benefits, the patient was deemed in                         satisfactory condition to undergo the procedure. The                         anesthesia plan was to use general anesthesia.                         Immediately prior to administration of medications,                         the patient was re-assessed for adequacy to receive                         sedatives. The heart rate, respiratory rate, oxygen                         saturations, blood pressure, adequacy of pulmonary                         ventilation, and response to care were monitored                         throughout the procedure. The physical status of the                         patient was re-assessed after the procedure.                        After obtaining informed consent, the colonoscope was                         passed under direct vision. Throughout the procedure,                         the patient's blood pressure, pulse, and oxygen  saturations were monitored continuously. The                         Colonoscope was introduced through the anus and                         advanced to the the cecum, identified by appendiceal                         orifice and ileocecal valve. The colonoscopy was                         performed without difficulty. The patient tolerated                         the procedure well. The quality of the bowel                         preparation was evaluated using the BBPS [facility] Bowel                         Preparation Scale) with scores of: Right Colon = 3,                         Transverse Colon = 3 and Left Colon = 3 (entire mucosa                          seen well with no residual staining, small fragments                         of stool or opaque liquid). The total BBPS score                         equals 9. Findings:      The perianal and digital rectal examinations were normal. Pertinent       negatives include normal sphincter tone and no palpable rectal lesions.      Seven sessile polyps were found in the rectum 1, descending colon 4 and       ascending colon 2. The polyps were 4 to 8 mm in size. These polyps were       removed with a cold snare. Resection and retrieval were complete.       Estimated blood loss was minimal.      The retroflexed view of the distal rectum and anal verge was normal and       showed no anal or rectal abnormalities. Impression:            - Seven 4 to 8 mm polyps in the rectum, in the                         descending colon and in the ascending colon, removed                         with a cold snare. Resected and retrieved.                        - The distal rectum and anal verge are normal on  retroflexion view. Recommendation:        - Discharge patient to home (with escort).                        - Resume previous diet today.                        - Await pathology results.                        - Repeat colonoscopy in 3 - 5 years for surveillance. Procedure Code(s):     --- Professional ---                        [phone], Colonoscopy, flexible; with removal of                         tumor(s), polyp(s), or other lesion(s) by snare                         technique Diagnosis Code(s):     --- Professional ---                        Z86.010, Personal history of colonic polyps                        K62.1, Rectal polyp                        K63.5, Polyp of colon CPT copyright 2019 American Medical Association. All rights reserved. The codes documented in this report are preliminary and upon coder review may  be revised to meet current compliance  requirements. Dr. Ulyess Mort Lin Landsman MD, MD 03/08/2020 9:26:32 AM This report has been signed electronically. Number of Addenda: 0 Note Initiated On: 03/08/2020 7:58 AM Scope Withdrawal Time: 0 hours 28 minutes 28 seconds  Total Procedure Duration: 0 hours 31 minutes 37 seconds  Estimated Blood Loss:  Estimated blood loss: none.      [facility]

## 2020-03-09 NOTE — Anesthesia Preprocedure Evaluation (Addendum)
Anesthesia Evaluation  Patient identified by MRN, date of birth, ID band Patient awake    Reviewed: Allergy & Precautions, H&P , NPO status , Patient's Chart, lab work & pertinent test results  History of Anesthesia Complications Negative for: history of anesthetic complications  Airway Mallampati: III  TM Distance: >3 FB     Dental   Pulmonary sleep apnea , neg COPD,    breath sounds clear to auscultation       Cardiovascular hypertension, (-) angina(-) Past MI and (-) Cardiac Stents (-) dysrhythmias  Rhythm:regular Rate:Normal     Neuro/Psych Anxiety negative neurological ROS  negative psych ROS   GI/Hepatic Neg liver ROS, GERD  Controlled,  Endo/Other  diabetes  Renal/GU negative Renal ROS  negative genitourinary   Musculoskeletal   Abdominal   Peds  Hematology negative hematology ROS (+)   Anesthesia Other Findings Past Medical History: No date: Diabetes mellitus without complication (HCC) No date: GERD (gastroesophageal reflux disease) No date: Hypertension No date: Morbid obesity ([place]) No date: Sleep apnea  Past Surgical History: 04/25/2011: BREATH TEK H PYLORI     Comment:  Procedure: BREATH TEK H PYLORI;  Surgeon: Pedro Earls, MD;  Location: [facility];  Service: General;                Laterality: N/A; 2001: CHOLECYSTECTOMY 03/08/2020: COLONOSCOPY WITH PROPOFOL; N/A     Comment:  Procedure: COLONOSCOPY WITH PROPOFOL;  Surgeon: Lin Landsman, MD;  Location: [facility] ENDOSCOPY;  Service:               Gastroenterology;  Laterality: N/A; 03/08/2020: ESOPHAGOGASTRODUODENOSCOPY (EGD) WITH PROPOFOL     Comment:  Procedure: ESOPHAGOGASTRODUODENOSCOPY (EGD) WITH               PROPOFOL;  Surgeon: Lin Landsman, MD;  Location:               [facility] ENDOSCOPY;  Service: Gastroenterology;; 1993: heart ablation 2012: JOINT REPLACEMENT     Comment:  right hip 2008:  [place] 11/09/2016: [place]; N/A     Comment:  Procedure: SCROTUM EXPLORATION;  Surgeon: Nickie Retort, MD;  Location: [facility] ORS;  Service: Urology;                Laterality: N/A;  BMI    Body Mass Index: 36.52 kg/m      Reproductive/Obstetrics negative OB ROS                            Anesthesia Physical Anesthesia Plan  ASA: III  Anesthesia Plan: General   Post-op Pain Management:    Induction:   PONV Risk Score and Plan: Propofol infusion and TIVA  Airway Management Planned:   Additional Equipment:   Intra-op Plan:   Post-operative Plan:   Informed Consent: I have reviewed the patients History and Physical, chart, labs and discussed the procedure including the risks, benefits and alternatives for the proposed anesthesia with the patient or authorized representative who has indicated his/her understanding and acceptance.     Dental Advisory Given  Plan Discussed with: Anesthesiologist, CRNA and Surgeon  Anesthesia Plan Comments:         Anesthesia Quick Evaluation

## 2020-03-11 ENCOUNTER — Encounter: Payer: Self-pay | Admitting: Gastroenterology

## 2020-03-11 LAB — SURGICAL PATHOLOGY

## 2020-04-06 ENCOUNTER — Other Ambulatory Visit: Payer: Self-pay

## 2020-04-06 ENCOUNTER — Emergency Department
Admission: EM | Admit: 2020-04-06 | Discharge: 2020-04-07 | Disposition: A | Discharge status: Home / Self Care | Payer: BC Managed Care – PPO | Attending: Emergency Medicine | Admitting: Emergency Medicine

## 2020-04-06 DIAGNOSIS — Z79899 Other long term (current) drug therapy: Secondary | ICD-10-CM | POA: Insufficient documentation

## 2020-04-06 DIAGNOSIS — Z96641 Presence of right artificial hip joint: Secondary | ICD-10-CM | POA: Insufficient documentation

## 2020-04-06 DIAGNOSIS — D1809 Hemangioma of other sites: Secondary | ICD-10-CM

## 2020-04-06 DIAGNOSIS — I10 Essential (primary) hypertension: Secondary | ICD-10-CM | POA: Diagnosis not present

## 2020-04-06 DIAGNOSIS — Z794 Long term (current) use of insulin: Secondary | ICD-10-CM | POA: Insufficient documentation

## 2020-04-06 DIAGNOSIS — E119 Type 2 diabetes mellitus without complications: Secondary | ICD-10-CM | POA: Diagnosis not present

## 2020-04-06 DIAGNOSIS — D1803 Hemangioma of intra-abdominal structures: Secondary | ICD-10-CM | POA: Insufficient documentation

## 2020-04-06 DIAGNOSIS — Z7984 Long term (current) use of oral hypoglycemic drugs: Secondary | ICD-10-CM | POA: Insufficient documentation

## 2020-04-06 LAB — COMPREHENSIVE METABOLIC PANEL
ALT: 39 U/L (ref 0–44)
AST: 43 U/L — ABNORMAL HIGH (ref 15–41)
Albumin: 4 g/dL (ref 3.5–5.0)
Alkaline Phosphatase: 42 U/L (ref 38–126)
Anion gap: 8 (ref 5–15)
BUN: 25 mg/dL — ABNORMAL HIGH (ref 8–23)
CO2: 24 mmol/L (ref 22–32)
Calcium: 9.2 mg/dL (ref 8.9–10.3)
Chloride: 99 mmol/L (ref 98–111)
Creatinine, Ser: 1.43 mg/dL — ABNORMAL HIGH (ref 0.61–1.24)
GFR, Estimated: 56 mL/min — ABNORMAL LOW (ref >60)
Glucose, Bld: 261 mg/dL — ABNORMAL HIGH (ref 70–99)
Potassium: 3.9 mmol/L (ref 3.5–5.1)
Sodium: 131 mmol/L — ABNORMAL LOW (ref 135–145)
Total Bilirubin: 1.4 mg/dL — ABNORMAL HIGH (ref 0.3–1.2)
Total Protein: 7.6 g/dL (ref 6.5–8.1)

## 2020-04-06 LAB — CBC WITH DIFFERENTIAL/PLATELET
Abs Immature Granulocytes: 0.04 10*3/uL (ref 0.00–0.07)
Basophils Absolute: 0.1 10*3/uL (ref 0.0–0.1)
Basophils Relative: 1 %
Eosinophils Absolute: 0.1 10*3/uL (ref 0.0–0.5)
Eosinophils Relative: 1 %
HCT: 44.6 % (ref 39.0–52.0)
Hemoglobin: 15.5 g/dL (ref 13.0–17.0)
Immature Granulocytes: 1 %
Lymphocytes Relative: 21 %
Lymphs Abs: 1.9 10*3/uL (ref 0.7–4.0)
MCH: 29.2 pg (ref 26.0–34.0)
MCHC: 34.8 g/dL (ref 30.0–36.0)
MCV: 84 fL (ref 80.0–100.0)
Monocytes Absolute: 1.1 10*3/uL — ABNORMAL HIGH (ref 0.1–1.0)
Monocytes Relative: 12 %
Neutro Abs: 5.7 10*3/uL (ref 1.7–7.7)
Neutrophils Relative %: 64 %
Platelets: 225 10*3/uL (ref 150–400)
RBC: 5.31 MIL/uL (ref 4.22–5.81)
RDW: 12.4 % (ref 11.5–15.5)
WBC: 8.9 10*3/uL (ref 4.0–10.5)
nRBC: 0 % (ref 0.0–0.2)

## 2020-04-06 NOTE — ED Triage Notes (Signed)
Pt states he has a cyst on his lower abd that has been there for months, pt states that is occasionally bleeds, but tonight pt states when he got up to shower the cyst started bleeding and hasnt stopped since 5pm. Pt states it is "pouring blood". Blood noted to outside of pts clothes at this time. Pt states minimal pain to area. Has been seen by PCP and Dermatologist who was told he needs to follow up with vascular dr.

## 2020-04-07 ENCOUNTER — Other Ambulatory Visit (INDEPENDENT_AMBULATORY_CARE_PROVIDER_SITE_OTHER): Payer: Self-pay | Admitting: Nurse Practitioner

## 2020-04-07 NOTE — ED Provider Notes (Signed)
[facility] Emergency Department Provider Note   ____________________________________________   Event Date/Time   First MD Initiated Contact with Patient 04/07/20 0000     (approximate)  I have reviewed the triage vital signs and the nursing notes.   HISTORY  Chief Complaint No chief complaint on file.    HPI Howard Young is a 60 y.o. male with a stated past medical history of type 2 diabetes, hypertension, and morbid obesity presents for a an area to his suprapubic area that has been bleeding since 5 PM.  Patient says that he has a small cyst at this point of his abdomen that occasionally bleeds when it is unroofed.  Patient states that he has seen a dermatologist for this and was told that he needed to see a vascular doctor but patient has not been able to follow-up yet.  Patient denies any pain over the site.  Patient denies any other complaints at this time.  Patient currently denies any vision changes, tinnitus, difficulty speaking, facial droop, sore throat, chest pain, shortness of breath, abdominal pain, nausea/vomiting/diarrhea, dysuria, or weakness/numbness/paresthesias in any extremity         Past Medical History:  Diagnosis Date  . Diabetes mellitus without complication ([place])   . GERD (gastroesophageal reflux disease)   . Hypertension   . Morbid obesity ([place])   . Sleep apnea     Patient Active Problem List   Diagnosis Date Noted  . Barrett's esophagus without dysplasia   . History of adenomatous polyp of colon 01/07/2020  . Loss of memory 04/10/2018  . Diabetic retinopathy ([place]) 02/28/2018  . Fournier gangrene 11/09/2016  . PVC (premature ventricular contraction) 08/16/2015  . Anxiety 09/15/2014  . Elevated CK 09/15/2014  . Essential hypertension 09/15/2014  . Hypertriglyceridemia 09/15/2014  . Hypotestosteronism 09/15/2014  . NASH (nonalcoholic steatohepatitis) 09/15/2014  . PE (pulmonary embolism) 09/15/2014  . Diabetes  mellitus with diabetic neuropathy ([place]) 09/15/2014  . Apnea, sleep 09/15/2014  . Male hypogonadism 06/10/2013  . Metabolic syndrome 50/35/4656  . Obesity-BMI 41 03/31/2011  . Status post gastric banding-APL 2008 03/31/2011  . WPW (Wolff-Parkinson-White syndrome)-cardiac ablation 1993 03/31/2011  . S/P cholecystectomy-2001 03/31/2011    Past Surgical History:  Procedure Laterality Date  . BREATH TEK H PYLORI  04/25/2011   Procedure: BREATH TEK H PYLORI;  Surgeon: Pedro Earls, MD;  Location: [facility];  Service: General;  Laterality: N/A;  . CHOLECYSTECTOMY  2001  . COLONOSCOPY WITH PROPOFOL N/A 03/08/2020   Procedure: COLONOSCOPY WITH PROPOFOL;  Surgeon: Lin Landsman, MD;  Location: [facility] ENDOSCOPY;  Service: Gastroenterology;  Laterality: N/A;  . ESOPHAGOGASTRODUODENOSCOPY (EGD) WITH PROPOFOL  03/08/2020   Procedure: ESOPHAGOGASTRODUODENOSCOPY (EGD) WITH PROPOFOL;  Surgeon: Lin Landsman, MD;  Location: [facility] ENDOSCOPY;  Service: Gastroenterology;;  . heart ablation  1993  . JOINT REPLACEMENT  2012   right hip  . LAPAROSCOPIC GASTRIC BANDING  2008  . SCROTAL EXPLORATION N/A 11/09/2016   Procedure: SCROTUM EXPLORATION;  Surgeon: Nickie Retort, MD;  Location: [facility] ORS;  Service: Urology;  Laterality: N/A;    Prior to Admission medications   Medication Sig Start Date End Date Taking? Authorizing Provider  Armodafinil (NUVIGIL) 150 MG tablet Take 1 tablet (150 mg total) by mouth daily. 01/07/20   Birdie Sons, MD  B-D UF III MINI PEN NEEDLES 31G X 5 MM MISC SMARTSIG:1 Each SUB-Q Twice Daily 08/29/19   [provider]  clotrimazole (LOTRIMIN) 1 % cream Apply 1 application  topically 2 (two) times daily. 01/21/20   Trinna Post, PA-C  Continuous Blood Gluc Sensor (FREESTYLE LIBRE 2 SENSOR) MISC USE 1 KIT EVERY 14 (FOURTEEN) DAYS FOR GLUCOSE MONITORING 05/31/19   [provider]  gabapentin (NEURONTIN) 100 MG capsule Take 1 capsule by mouth 2  (two) times daily.  11/26/17   [provider]  Insulin Pen Needle (B-D UF III MINI PEN NEEDLES) 31G X 5 MM MISC See admin instructions. 07/11/19   [provider]  lisinopril-hydrochlorothiazide (ZESTORETIC) 20-12.5 MG tablet Take by mouth. 04/21/19   [provider]  metFORMIN (GLUCOPHAGE) 500 MG tablet Take 500 mg by mouth 2 (two) times daily with a meal.    [provider]  metoprolol succinate (TOPROL-XL) 50 MG 24 hr tablet TAKE 1 TABLET BY MOUTH EVERY DAY WITH OR IMMEDIATELY FOLLOWING FOOD 12/17/19   Birdie Sons, MD  tamsulosin (FLOMAX) 0.4 MG CAPS capsule TAKE 1 CAPSULE BY MOUTH EVERY DAY 12/17/19   Birdie Sons, MD  TOUJEO SOLOSTAR 300 UNIT/ML Solostar Pen Inject 32 Units into the skin.  03/20/19   [provider]  insulin aspart (NOVOLOG) 100 UNIT/ML injection Inject 60 Units into the skin at bedtime. Patient taking 30 - 60 units daily   04/25/11  [provider]    Allergies Metoclopramide hcl and Glipizide  Family History  Problem Relation Age of Onset  . Cancer Mother        breast, lung  . Diabetes Mother   . Heart failure Mother   . Cancer Father        colon  . Cancer Sister        breast  . Cancer Sister        ovarian    Social History Social History   Tobacco Use  . Smoking status: Never Smoker  . Smokeless tobacco: Never Used  Vaping Use  . Vaping Use: Never used  Substance Use Topics  . Alcohol use: No  . Drug use: No    Review of Systems Constitutional: No fever/chills Eyes: No visual changes. ENT: No sore throat. Cardiovascular: Denies chest pain. Respiratory: Denies shortness of breath. Gastrointestinal: No abdominal pain.  No nausea, no vomiting.  No diarrhea. Genitourinary: Negative for dysuria. Musculoskeletal: Negative for acute arthralgias Skin: Endorses bleeding abdominal lesion Neurological: Negative for headaches, weakness/numbness/paresthesias in any extremity Psychiatric:  Negative for suicidal ideation/homicidal ideation   ____________________________________________   PHYSICAL EXAM:  VITAL SIGNS: ED Triage Vitals  Enc Vitals Group     BP 04/06/20 2143 (!) 178/95     Pulse Rate 04/06/20 2143 70     Resp 04/06/20 2143 19     Temp 04/06/20 2143 98.3 F (36.8 C)     Temp Source 04/06/20 2143 Oral     SpO2 04/06/20 2143 98 %     Weight 04/06/20 2141 (!) 310 lb (140.6 kg)     Height 04/06/20 2141 6' 4"  (1.93 m)     Head Circumference --      Peak Flow --      Pain Score 04/06/20 2141 2     Pain Loc --      Pain Edu? --      Excl. in [place]? --    Constitutional: Alert and oriented. Well appearing and in no acute distress. Eyes: Conjunctivae are normal. PERRL. Head: Atraumatic. Nose: No congestion/rhinnorhea. Mouth/Throat: Mucous membranes are moist. Neck: No stridor Cardiovascular: Grossly normal heart sounds.  Good peripheral circulation. Respiratory: Normal respiratory  effort.  No retractions. Gastrointestinal: Soft and nontender. No distention. Musculoskeletal: No obvious deformities Neurologic:  Normal speech and language. No gross focal neurologic deficits are appreciated. Skin:  Skin is warm and dry.  Small 2 cm diameter area to the mid suprapubic region that is raised, cyanotic, and with a small area of bleeding at the 7:00 region Psychiatric: Mood and affect are normal. Speech and behavior are normal.  ____________________________________________   LABS (all labs ordered are listed, but only abnormal results are displayed)  Labs Reviewed  CBC WITH DIFFERENTIAL/PLATELET - Abnormal; Notable for the following components:      Result Value   Monocytes Absolute 1.1 (*)    All other components within normal limits  COMPREHENSIVE METABOLIC PANEL - Abnormal; Notable for the following components:   Sodium 131 (*)    Glucose, Bld 261 (*)    BUN 25 (*)    Creatinine, Ser 1.43 (*)    AST 43 (*)    Total Bilirubin 1.4 (*)    GFR, Estimated  56 (*)    All other components within normal limits    PROCEDURES  Procedure(s) performed (including Critical Care):  Procedures   ____________________________________________   INITIAL IMPRESSION / ASSESSMENT AND PLAN / ED COURSE  As part of my medical decision making, I reviewed the following data within the [facility] notes reviewed and incorporated, Old chart reviewed, and Notes from prior ED visits reviewed and incorporated        Patient is a 60 year old male with a abdominal wall lesion that began bleeding approximately 5 PM today.  Bleeding was well controlled with Surgicel and overlying 2 x 2 gauze.  Patient was given supplies for redressing at home as well as follow-up information with the vascular surgeons as this is likely a bleeding hemangioma or AVM that will need to be definitively managed.  This wound is still hemostatic after observation.  The patient has been reexamined and is ready to be discharged.  All diagnostic results have been reviewed and discussed with the patient/family.  Care plan has been outlined and the patient/family understands all current diagnoses, results, and treatment plans.  There are no new complaints, changes, or physical findings at this time.  All questions have been addressed and answered.  Patient was instructed to, and agrees to follow-up with their primary care physician as well as return to the emergency department if any new or worsening symptoms develop.      ____________________________________________   FINAL CLINICAL IMPRESSION(S) / ED DIAGNOSES  Final diagnoses:  Hemangioma of abdominal wall     ED Discharge Orders    None       Note:  This document was prepared using Dragon voice recognition software and may include unintentional dictation errors.   Naaman Plummer, MD 04/07/20 0100

## 2020-04-08 ENCOUNTER — Ambulatory Visit (INDEPENDENT_AMBULATORY_CARE_PROVIDER_SITE_OTHER): Payer: BC Managed Care – PPO | Admitting: Vascular Surgery

## 2020-04-08 ENCOUNTER — Other Ambulatory Visit: Payer: Self-pay

## 2020-04-08 ENCOUNTER — Encounter (INDEPENDENT_AMBULATORY_CARE_PROVIDER_SITE_OTHER): Payer: Self-pay | Admitting: Vascular Surgery

## 2020-04-08 VITALS — BP 166/89 | HR 68 | Resp 16 | Ht 76.0 in | Wt 320.0 lb

## 2020-04-08 DIAGNOSIS — I10 Essential (primary) hypertension: Secondary | ICD-10-CM | POA: Diagnosis not present

## 2020-04-08 DIAGNOSIS — E781 Pure hyperglyceridemia: Secondary | ICD-10-CM | POA: Diagnosis not present

## 2020-04-08 DIAGNOSIS — L02211 Cutaneous abscess of abdominal wall: Secondary | ICD-10-CM | POA: Diagnosis not present

## 2020-04-08 DIAGNOSIS — E0840 Diabetes mellitus due to underlying condition with diabetic neuropathy, unspecified: Secondary | ICD-10-CM | POA: Diagnosis not present

## 2020-04-08 MED ORDER — DOXYCYCLINE HYCLATE 100 MG PO CAPS: 100.0000 mg | ORAL_CAPSULE | Freq: Two times a day (BID) | ORAL | 0 refills | Status: DC

## 2020-04-08 NOTE — Progress Notes (Signed)
MRN : [id number]  Howard Young is a 60 y.o. ([birth year]) male who presents with chief complaint of  Chief Complaint  Patient presents with  . New Patient (Initial Visit)    Post ED visit hemangioma of abdominal wall  .  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 Armodafinil (NUVIGIL) 150 MG tablet Take 1 tablet (150 mg total) by mouth daily.  . B-D UF III MINI PEN NEEDLES 31G X 5 MM MISC SMARTSIG:1 Each SUB-Q Twice Daily  . clotrimazole (LOTRIMIN) 1 % cream Apply 1 application topically 2 (two) times daily.  . Continuous Blood Gluc Sensor (FREESTYLE LIBRE 2 SENSOR) MISC USE 1 KIT EVERY 14 (FOURTEEN) DAYS FOR GLUCOSE MONITORING  . doxycycline (VIBRAMYCIN) 100 MG capsule Take 1 capsule (100 mg total) by mouth 2 (two) times daily.  Marland Kitchen gabapentin (NEURONTIN) 100 MG capsule Take 1 capsule by mouth 2 (two) times daily.   . Insulin Pen Needle (B-D UF III MINI PEN NEEDLES) 31G X 5 MM MISC See admin instructions.  Marland Kitchen lisinopril-hydrochlorothiazide (ZESTORETIC) 20-12.5 MG tablet Take by mouth.  . metFORMIN (GLUCOPHAGE) 500 MG tablet Take 500 mg by mouth 2 (two) times daily with a meal.  . metoprolol succinate (TOPROL-XL) 50 MG 24 hr tablet TAKE 1 TABLET BY MOUTH EVERY DAY WITH OR IMMEDIATELY FOLLOWING FOOD  . tamsulosin (FLOMAX) 0.4 MG CAPS capsule TAKE 1 CAPSULE BY MOUTH EVERY DAY  . TOUJEO SOLOSTAR 300 UNIT/ML Solostar Pen Inject 32 Units into the skin.     Past Medical History:  Diagnosis Date  . Diabetes mellitus without complication ([place])   . GERD (gastroesophageal reflux disease)   .  Hypertension   . Morbid obesity ([place])   . Sleep apnea     Past Surgical History:  Procedure Laterality Date  . BREATH TEK H PYLORI  04/25/2011   Procedure: BREATH TEK H PYLORI;  Surgeon: Pedro Earls, MD;  Location: [facility];  Service: General;  Laterality: N/A;  . CHOLECYSTECTOMY  2001  . COLONOSCOPY WITH PROPOFOL N/A 03/08/2020   Procedure: COLONOSCOPY WITH PROPOFOL;  Surgeon: Lin Landsman, MD;  Location: [facility]COPY;  Service: Gastroenterology;  Laterality: N/A;  . ESOPHAGOGASTRODUODENOSCOPY (EGD) WITH PROPOFOL  03/08/2020   Procedure: ESOPHAGOGASTRODUODENOSCOPY (EGD) WITH PROPOFOL;  Surgeon: Lin Landsman, MD;  Location: [facility];  Service: Gastroenterology;;  . heart ablation  1993  . JOINT REPLACEMENT  2012   right hip  . LAPAROSCOPIC GASTRIC BANDING  2008  . SCROTAL EXPLORATION N/A 11/09/2016   Procedure: SCROTUM EXPLORATION;  Surgeon: Nickie Retort, MD;  Location: [facility] ORS;  Service: Urology;  Laterality: N/A;    Social History Social History   Tobacco Use  . Smoking status: Never Smoker  . Smokeless tobacco: Never Used  Vaping Use  . Vaping Use: Never used  Substance Use Topics  . Alcohol use: No  . Drug use: No    Family History Family History  Problem Relation Age of Onset  . Cancer Mother        breast, lung  . Diabetes Mother   . Heart failure Mother   .  Cancer Father        colon  . Cancer Sister        breast  . Cancer Sister        ovarian  No family history of bleeding/clotting disorders, porphyria or autoimmune disease   Allergies  Allergen Reactions  . Metoclopramide Hcl Shortness Of Breath and Anxiety  . Glipizide     Profound weigh gain     REVIEW OF SYSTEMS (Negative unless checked)  Constitutional: [] Weight loss  [] Fever  [] Chills Cardiac: [] Chest pain   [] Chest pressure   [] Palpitations   [] Shortness of breath when laying flat   [] Shortness of breath with exertion. Vascular:  [] Pain in legs with  walking   [] Pain in legs at rest  [] History of DVT   [] Phlebitis   [] Swelling in legs   [] Varicose veins   [] Non-healing ulcers Pulmonary:   [] Uses home oxygen   [] Productive cough   [] Hemoptysis   [] Wheeze  [] COPD   [] Asthma Neurologic:  [] Dizziness   [] Seizures   [] History of stroke   [] History of TIA  [] Aphasia   [] Vissual changes   [] Weakness or numbness in arm   [] Weakness or numbness in leg Musculoskeletal:   [] Joint swelling   [] Joint pain   [] Low back pain Hematologic:  [] Easy bruising  [] Easy bleeding   [] Hypercoagulable state   [] Anemic Gastrointestinal:  [] Diarrhea   [] Vomiting  [] Gastroesophageal reflux/heartburn   [] Difficulty swallowing. Genitourinary:  [] Chronic kidney disease   [] Difficult urination  [] Frequent urination   [] Blood in urine Skin:  [] Rashes   [] Ulcers  Psychological:  [] History of anxiety   []  History of major depression.  Physical Examination  Vitals:   04/08/20 1035  BP: (!) 166/89  Pulse: 68  Resp: 16  Weight: (!) 320 lb (145.2 kg)  Height: 6' 4"  (1.93 m)   Body mass index is 38.95 kg/m. Gen: WD/WN, NAD Head: [place]/AT, No temporalis wasting.  Ear/Nose/Throat: Hearing grossly intact, nares w/o erythema or drainage, poor dentition Eyes: PER, EOMI, sclera nonicteric.  Neck: Supple, no masses.  No bruit or JVD.  Pulmonary:  Good air movement, clear to auscultation bilaterally, no use of accessory muscles.  Cardiac: RRR, normal S1, S2, no Murmurs. Vascular: bleeding from what looks like an infected cyst pus noted erythremia  Of surounding skin Vessel Right Left  Radial Palpable Palpable  Gastrointestinal: soft, non-distended. No guarding/no peritoneal signs.  Musculoskeletal: M/S 5/5 throughout.  No deformity or atrophy.  Neurologic: CN 2-12 intact. Pain and light touch intact in extremities.  Symmetrical.  Speech is fluent. Motor exam as listed above. Psychiatric: Judgment intact, Mood & affect appropriate for pt's clinical situation. Dermatologic: No  rashes or ulcers noted.  No changes consistent with cellulitis.   CBC Lab Results  Component Value Date   WBC 8.9 04/06/2020   HGB 15.5 04/06/2020   HCT 44.6 04/06/2020   MCV 84.0 04/06/2020   PLT 225 04/06/2020    BMET    Component Value Date/Time   NA 131 (L) 04/06/2020 2151   NA 135 06/12/2014 1204   K 3.9 04/06/2020 2151   K 4.0 06/12/2014 1204   CL 99 04/06/2020 2151   CL 101 06/12/2014 1204   CO2 24 04/06/2020 2151   CO2 27 06/12/2014 1204   GLUCOSE 261 (H) 04/06/2020 2151   GLUCOSE 135 (H) 06/12/2014 1204   BUN 25 (H) 04/06/2020 2151   BUN 16 06/12/2014 1204   CREATININE 1.43 (H) 04/06/2020 2151   CREATININE 1.38 (H)  06/12/2014 1204   CALCIUM 9.2 04/06/2020 2151   CALCIUM 8.9 06/12/2014 1204   GFRNONAA 56 (L) 04/06/2020 2151   GFRNONAA 57 (L) 06/12/2014 1204   GFRAA >60 02/04/2018 1131   GFRAA >60 06/12/2014 1204   Estimated Creatinine Clearance: 84.6 mL/min (A) (by C-G formula based on SCr of 1.43 mg/dL (H)).  COAG No results found for: INR, PROTIME  Radiology No results found.   Assessment/Plan 1. Abscess of abdominal wall Start Doxycycline Re-evaluate on Monday may need excision  2. Essential hypertension Continue antihypertensive medications as already ordered, these medications have been reviewed and there are no changes at this time.   3. Diabetes mellitus due to underlying condition with diabetic neuropathy, unspecified whether long term insulin use ([place]) Continue hypoglycemic medications as already ordered, these medications have been reviewed and there are no changes at this time.  Hgb A1C to be monitored as already arranged by primary service   4. Hypertriglyceridemia Continue statin as ordered and reviewed, no changes at this time     Hortencia Pilar, MD  04/08/2020 11:18 AM

## 2020-04-11 NOTE — Progress Notes (Signed)
MRN : [id number]  Howard Young is a 60 y.o. ([birth year]) male who presents with chief complaint of No chief complaint on file. Marland Kitchen  History of Present Illness:   Seen for follow up regarding his abdominal wall abcess.  He reports that it is less tender.  He has not had any further bleeding.  He denies fever chills.  No outpatient medications have been marked as taking for the 04/12/20 encounter (Appointment) with Delana Meyer, Dolores Lory, MD.    Past Medical History:  Diagnosis Date  . Diabetes mellitus without complication ([place])   . GERD (gastroesophageal reflux disease)   . Hypertension   . Morbid obesity ([place])   . Sleep apnea     Past Surgical History:  Procedure Laterality Date  . BREATH TEK H PYLORI  04/25/2011   Procedure: BREATH TEK H PYLORI;  Surgeon: Pedro Earls, MD;  Location: [facility];  Service: General;  Laterality: N/A;  . CHOLECYSTECTOMY  2001  . COLONOSCOPY WITH PROPOFOL N/A 03/08/2020   Procedure: COLONOSCOPY WITH PROPOFOL;  Surgeon: Lin Landsman, MD;  Location: [facility] ENDOSCOPY;  Service: Gastroenterology;  Laterality: N/A;  . ESOPHAGOGASTRODUODENOSCOPY (EGD) WITH PROPOFOL  03/08/2020   Procedure: ESOPHAGOGASTRODUODENOSCOPY (EGD) WITH PROPOFOL;  Surgeon: Lin Landsman, MD;  Location: [facility];  Service: Gastroenterology;;  . heart ablation  1993  . JOINT REPLACEMENT  2012   right hip  . LAPAROSCOPIC GASTRIC BANDING  2008  . SCROTAL EXPLORATION N/A 11/09/2016   Procedure: SCROTUM EXPLORATION;  Surgeon: Nickie Retort, MD;  Location: [facility] ORS;  Service: Urology;  Laterality: N/A;    Social History Social History   Tobacco Use  . Smoking status: Never Smoker  . Smokeless tobacco: Never Used  Vaping Use  . Vaping Use: Never used  Substance Use Topics  . Alcohol use: No  . Drug use: No    Family History Family History  Problem Relation Age of Onset  . Cancer Mother        breast, lung  . Diabetes Mother   . Heart failure  Mother   . Cancer Father        colon  . Cancer Sister        breast  . Cancer Sister        ovarian  No family history of bleeding/clotting disorders, porphyria or autoimmune disease   Allergies  Allergen Reactions  . Metoclopramide Hcl Shortness Of Breath and Anxiety  . Glipizide     Profound weigh gain     REVIEW OF SYSTEMS (Negative unless checked)  Constitutional: [] Weight loss  [] Fever  [] Chills Cardiac: [] Chest pain   [] Chest pressure   [] Palpitations   [] Shortness of breath when laying flat   [] Shortness of breath with exertion. Vascular:  [] Pain in legs with walking   [] Pain in legs at rest  [] History of DVT   [] Phlebitis   [x] Swelling in legs   [] Varicose veins   [] Non-healing ulcers Pulmonary:   [] Uses home oxygen   [] Productive cough   [] Hemoptysis   [] Wheeze  [] COPD   [] Asthma Neurologic:  [] Dizziness   [] Seizures   [] History of stroke   [] History of TIA  [] Aphasia   [] Vissual changes   [] Weakness or numbness in arm   [x] Weakness or numbness in leg Musculoskeletal:   [] Joint swelling   [x] Joint pain   [x] Low back pain Hematologic:  [] Easy bruising  [] Easy bleeding   [] Hypercoagulable state   [] Anemic Gastrointestinal:  [] Diarrhea   [] Vomiting  []   Gastroesophageal reflux/heartburn   [] Difficulty swallowing. Genitourinary:  [] Chronic kidney disease   [] Difficult urination  [] Frequent urination   [] Blood in urine Skin:  [] Rashes   [] Ulcers  Psychological:  [] History of anxiety   []  History of major depression.  Physical Examination  There were no vitals filed for this visit. There is no height or weight on file to calculate BMI. Gen: WD/WN, NAD Head: [place]/AT, No temporalis wasting.  Ear/Nose/Throat: Hearing grossly intact, nares w/o erythema or drainage, poor dentition Eyes: PER, EOMI, sclera nonicteric.  Neck: Supple, no masses.  No bruit or JVD.  Pulmonary:  Good air movement, clear to auscultation bilaterally, no use of accessory muscles.  Cardiac: RRR, normal S1,  S2, no Murmurs. Vascular: The lesion is no longer draining any pus.  It appears to be a dark scabbed area approximately 1 cm and circular in diameter.  The surrounding skin is no longer erythematous indurated or tender.  Upon removing the bandage it is bleeding slightly. Vessel Right Left  Radial Palpable Palpable  Gastrointestinal: soft, non-distended. No guarding/no peritoneal signs.  Musculoskeletal: M/S 5/5 throughout.  No deformity or atrophy.  Neurologic: CN 2-12 intact. Pain and light touch intact in extremities.  Symmetrical.  Speech is fluent. Motor exam as listed above. Psychiatric: Judgment intact, Mood & affect appropriate for pt's clinical situation. Dermatologic: No rashes or ulcers noted.  No changes consistent with cellulitis. Lymph : No Cervical lymphadenopathy, no lichenification or skin changes of chronic lymphedema.  CBC Lab Results  Component Value Date   WBC 8.9 04/06/2020   HGB 15.5 04/06/2020   HCT 44.6 04/06/2020   MCV 84.0 04/06/2020   PLT 225 04/06/2020    BMET    Component Value Date/Time   NA 131 (L) 04/06/2020 2151   NA 135 06/12/2014 1204   K 3.9 04/06/2020 2151   K 4.0 06/12/2014 1204   CL 99 04/06/2020 2151   CL 101 06/12/2014 1204   CO2 24 04/06/2020 2151   CO2 27 06/12/2014 1204   GLUCOSE 261 (H) 04/06/2020 2151   GLUCOSE 135 (H) 06/12/2014 1204   BUN 25 (H) 04/06/2020 2151   BUN 16 06/12/2014 1204   CREATININE 1.43 (H) 04/06/2020 2151   CREATININE 1.38 (H) 06/12/2014 1204   CALCIUM 9.2 04/06/2020 2151   CALCIUM 8.9 06/12/2014 1204   GFRNONAA 56 (L) 04/06/2020 2151   GFRNONAA 57 (L) 06/12/2014 1204   GFRAA >60 02/04/2018 1131   GFRAA >60 06/12/2014 1204   Estimated Creatinine Clearance: 84.6 mL/min (A) (by C-G formula based on SCr of 1.43 mg/dL (H)).  COAG No results found for: INR, PROTIME  Radiology No results found.   Assessment/Plan 1. Abscess of abdominal wall At this point given the appearance I recommend excision.  The  surrounding cellulitis appears to be well treated with the doxycycline and therefore I believe it is appropriate to move forward.  We will schedule excision of this mass/abscess in the operating room.  The risks and benefits were reviewed all questions answered patient agrees to proceed.  2. Diabetes mellitus due to underlying condition with diabetic neuropathy, unspecified whether long term insulin use ([place]) Continue hypoglycemic medications as already ordered, these medications have been reviewed and there are no changes at this time.  Hgb A1C to be monitored as already arranged by primary service   3. Essential hypertension Continue antihypertensive medications as already ordered, these medications have been reviewed and there are no changes at this time.   4. Hypertriglyceridemia Continue statin  as ordered and reviewed, no changes at this time     Hortencia Pilar, MD  04/11/2020 5:09 PM

## 2020-04-11 NOTE — H&P (View-Only) (Signed)
MRN : [id number]  Howard Young is a 60 y.o. (1961/01/07) male who presents with chief complaint of No chief complaint on file. Marland Kitchen  History of Present Illness:   Seen for follow up regarding his abdominal wall abcess.  He reports that it is less tender.  He has not had any further bleeding.  He denies fever chills.  No outpatient medications have been marked as taking for the 04/12/20 encounter (Appointment) with Delana Meyer, Dolores Lory, MD.    Past Medical History:  Diagnosis Date  . Diabetes mellitus without complication ([place])   . GERD (gastroesophageal reflux disease)   . Hypertension   . Morbid obesity ([place])   . Sleep apnea     Past Surgical History:  Procedure Laterality Date  . BREATH TEK H PYLORI  04/25/2011   Procedure: BREATH TEK H PYLORI;  Surgeon: Pedro Earls, MD;  Location: [facility];  Service: General;  Laterality: N/A;  . CHOLECYSTECTOMY  2001  . COLONOSCOPY WITH PROPOFOL N/A 03/08/2020   Procedure: COLONOSCOPY WITH PROPOFOL;  Surgeon: Lin Landsman, MD;  Location: [facility] ENDOSCOPY;  Service: Gastroenterology;  Laterality: N/A;  . ESOPHAGOGASTRODUODENOSCOPY (EGD) WITH PROPOFOL  03/08/2020   Procedure: ESOPHAGOGASTRODUODENOSCOPY (EGD) WITH PROPOFOL;  Surgeon: Lin Landsman, MD;  Location: [facility]OSCOPY;  Service: Gastroenterology;;  . heart ablation  1993  . JOINT REPLACEMENT  2012   right hip  . LAPAROSCOPIC GASTRIC BANDING  2008  . SCROTAL EXPLORATION N/A 11/09/2016   Procedure: SCROTUM EXPLORATION;  Surgeon: Nickie Retort, MD;  Location: [facility] ORS;  Service: Urology;  Laterality: N/A;    Social History Social History   Tobacco Use  . Smoking status: Never Smoker  . Smokeless tobacco: Never Used  Vaping Use  . Vaping Use: Never used  Substance Use Topics  . Alcohol use: No  . Drug use: No    Family History Family History  Problem Relation Age of Onset  . Cancer Mother        breast, lung  . Diabetes Mother   . Heart failure  Mother   . Cancer Father        colon  . Cancer Sister        breast  . Cancer Sister        ovarian  No family history of bleeding/clotting disorders, porphyria or autoimmune disease   Allergies  Allergen Reactions  . Metoclopramide Hcl Shortness Of Breath and Anxiety  . Glipizide     Profound weigh gain     REVIEW OF SYSTEMS (Negative unless checked)  Constitutional: [] Weight loss  [] Fever  [] Chills Cardiac: [] Chest pain   [] Chest pressure   [] Palpitations   [] Shortness of breath when laying flat   [] Shortness of breath with exertion. Vascular:  [] Pain in legs with walking   [] Pain in legs at rest  [] History of DVT   [] Phlebitis   [x] Swelling in legs   [] Varicose veins   [] Non-healing ulcers Pulmonary:   [] Uses home oxygen   [] Productive cough   [] Hemoptysis   [] Wheeze  [] COPD   [] Asthma Neurologic:  [] Dizziness   [] Seizures   [] History of stroke   [] History of TIA  [] Aphasia   [] Vissual changes   [] Weakness or numbness in arm   [x] Weakness or numbness in leg Musculoskeletal:   [] Joint swelling   [x] Joint pain   [x] Low back pain Hematologic:  [] Easy bruising  [] Easy bleeding   [] Hypercoagulable state   [] Anemic Gastrointestinal:  [] Diarrhea   [] Vomiting  []   Gastroesophageal reflux/heartburn   [] Difficulty swallowing. Genitourinary:  [] Chronic kidney disease   [] Difficult urination  [] Frequent urination   [] Blood in urine Skin:  [] Rashes   [] Ulcers  Psychological:  [] History of anxiety   []  History of major depression.  Physical Examination  There were no vitals filed for this visit. There is no height or weight on file to calculate BMI. Gen: WD/WN, NAD Head: [place]/AT, No temporalis wasting.  Ear/Nose/Throat: Hearing grossly intact, nares w/o erythema or drainage, poor dentition Eyes: PER, EOMI, sclera nonicteric.  Neck: Supple, no masses.  No bruit or JVD.  Pulmonary:  Good air movement, clear to auscultation bilaterally, no use of accessory muscles.  Cardiac: RRR, normal S1,  S2, no Murmurs. Vascular: The lesion is no longer draining any pus.  It appears to be a dark scabbed area approximately 1 cm and circular in diameter.  The surrounding skin is no longer erythematous indurated or tender.  Upon removing the bandage it is bleeding slightly. Vessel Right Left  Radial Palpable Palpable  Gastrointestinal: soft, non-distended. No guarding/no peritoneal signs.  Musculoskeletal: M/S 5/5 throughout.  No deformity or atrophy.  Neurologic: CN 2-12 intact. Pain and light touch intact in extremities.  Symmetrical.  Speech is fluent. Motor exam as listed above. Psychiatric: Judgment intact, Mood & affect appropriate for pt's clinical situation. Dermatologic: No rashes or ulcers noted.  No changes consistent with cellulitis. Lymph : No Cervical lymphadenopathy, no lichenification or skin changes of chronic lymphedema.  CBC Lab Results  Component Value Date   WBC 8.9 04/06/2020   HGB 15.5 04/06/2020   HCT 44.6 04/06/2020   MCV 84.0 04/06/2020   PLT 225 04/06/2020    BMET    Component Value Date/Time   NA 131 (L) 04/06/2020 2151   NA 135 06/12/2014 1204   K 3.9 04/06/2020 2151   K 4.0 06/12/2014 1204   CL 99 04/06/2020 2151   CL 101 06/12/2014 1204   CO2 24 04/06/2020 2151   CO2 27 06/12/2014 1204   GLUCOSE 261 (H) 04/06/2020 2151   GLUCOSE 135 (H) 06/12/2014 1204   BUN 25 (H) 04/06/2020 2151   BUN 16 06/12/2014 1204   CREATININE 1.43 (H) 04/06/2020 2151   CREATININE 1.38 (H) 06/12/2014 1204   CALCIUM 9.2 04/06/2020 2151   CALCIUM 8.9 06/12/2014 1204   GFRNONAA 56 (L) 04/06/2020 2151   GFRNONAA 57 (L) 06/12/2014 1204   GFRAA >60 02/04/2018 1131   GFRAA >60 06/12/2014 1204   Estimated Creatinine Clearance: 84.6 mL/min (A) (by C-G formula based on SCr of 1.43 mg/dL (H)).  COAG No results found for: INR, PROTIME  Radiology No results found.   Assessment/Plan 1. Abscess of abdominal wall At this point given the appearance I recommend excision.  The  surrounding cellulitis appears to be well treated with the doxycycline and therefore I believe it is appropriate to move forward.  We will schedule excision of this mass/abscess in the operating room.  The risks and benefits were reviewed all questions answered patient agrees to proceed.  2. Diabetes mellitus due to underlying condition with diabetic neuropathy, unspecified whether long term insulin use ([place]) Continue hypoglycemic medications as already ordered, these medications have been reviewed and there are no changes at this time.  Hgb A1C to be monitored as already arranged by primary service   3. Essential hypertension Continue antihypertensive medications as already ordered, these medications have been reviewed and there are no changes at this time.   4. Hypertriglyceridemia Continue statin  as ordered and reviewed, no changes at this time     Hortencia Pilar, MD  04/11/2020 5:09 PM

## 2020-04-12 ENCOUNTER — Ambulatory Visit (INDEPENDENT_AMBULATORY_CARE_PROVIDER_SITE_OTHER): Payer: BC Managed Care – PPO | Admitting: Vascular Surgery

## 2020-04-12 ENCOUNTER — Other Ambulatory Visit: Payer: Self-pay

## 2020-04-12 ENCOUNTER — Inpatient Hospital Stay
Admission: RE | Admit: 2020-04-12 | Discharge: 2020-04-12 | Disposition: A | Discharge status: Home / Self Care | Payer: BC Managed Care – PPO | Source: Ambulatory Visit

## 2020-04-12 ENCOUNTER — Other Ambulatory Visit (INDEPENDENT_AMBULATORY_CARE_PROVIDER_SITE_OTHER): Payer: Self-pay | Admitting: Nurse Practitioner

## 2020-04-12 ENCOUNTER — Telehealth (INDEPENDENT_AMBULATORY_CARE_PROVIDER_SITE_OTHER): Payer: Self-pay

## 2020-04-12 ENCOUNTER — Encounter (INDEPENDENT_AMBULATORY_CARE_PROVIDER_SITE_OTHER): Payer: Self-pay | Admitting: Vascular Surgery

## 2020-04-12 VITALS — BP 176/85 | HR 75 | Resp 16 | Wt 319.0 lb

## 2020-04-12 DIAGNOSIS — I10 Essential (primary) hypertension: Secondary | ICD-10-CM

## 2020-04-12 DIAGNOSIS — E781 Pure hyperglyceridemia: Secondary | ICD-10-CM

## 2020-04-12 DIAGNOSIS — E0840 Diabetes mellitus due to underlying condition with diabetic neuropathy, unspecified: Secondary | ICD-10-CM

## 2020-04-12 DIAGNOSIS — L02211 Cutaneous abscess of abdominal wall: Secondary | ICD-10-CM | POA: Diagnosis not present

## 2020-04-12 HISTORY — DX: Pneumonia, unspecified organism: J18.9

## 2020-04-12 NOTE — Patient Instructions (Addendum)
Your procedure is scheduled on: 04/14/2020 Report to the Registration Desk on the 1st floor of the [facility]. To find out your arrival time, please call [phone] between 1PM - 3PM on: 04/13/2020  REMEMBER: Instructions that are not followed completely may result in serious medical risk, up to and including death; or upon the discretion of your surgeon and anesthesiologist your surgery may need to be rescheduled.  Do not eat food after midnight the night before surgery.  No gum chewing, lozengers or hard candies.  You may however, drink CLEAR liquids up to 2 hours before you are scheduled to arrive for your surgery. Do not drink anything within 2 hours of your scheduled arrival time, Type 1 and Type 2 diabetics should only drink water.   TAKE THESE MEDICATIONS THE MORNING OF SURGERY WITH A SIP OF WATER: - metoprolol succinate (TOPROL-XL) 50 MG 24 hr tablet  (take one the night before and one on the morning of surgery - helps to prevent nausea after surgery.)  Stop Metformin 2 days prior to surgery. Do not take 02/28, 03/01, and do not take the morning of surgery.  Take 1/2 of usual insulin dose the night before surgery and none on the morning of surgery.  One week prior to surgery: Stop Anti-inflammatories (NSAIDS) such as Advil, Aleve, Ibuprofen, Motrin, Naproxen, Naprosyn and Aspirin based products such as Excedrin, Goodys Powder, BC Powder.  Stop ANY OVER THE COUNTER supplements until after surgery.  No Alcohol for 24 hours before or after surgery.  No Smoking including e-cigarettes for 24 hours prior to surgery.  No chewable tobacco products for at least 6 hours prior to surgery.  No nicotine patches on the day of surgery.  Do not use any "recreational" drugs for at least a week prior to your surgery.  Please be advised that the combination of cocaine and anesthesia may have negative outcomes, up to and including death. If you test positive for cocaine, your surgery  will be cancelled.  On the morning of surgery brush your teeth with toothpaste and water, you may rinse your mouth with mouthwash if you wish. Do not swallow any toothpaste or mouthwash.  Do not wear jewelry, make-up, hairpins, clips or nail polish.  Do not wear lotions, powders, or perfumes.   Do not shave body from the neck down 48 hours prior to surgery just in case you cut yourself which could leave a site for infection.  Also, freshly shaved skin may become irritated if using the CHG soap.  Contact lenses, hearing aids and dentures may not be worn into surgery.  Do not bring valuables to the hospital. [facility] is not responsible for any missing/lost belongings or valuables.   Use CHG Soap or wipes as directed on instruction sheet.  Bring your C-PAP to the hospital with you in case you may have to spend the night.   Notify your doctor if there is any change in your medical condition (cold, fever, infection).  Wear comfortable clothing (specific to your surgery type) to the hospital.  Plan for stool softeners for home use; pain medications have a tendency to cause constipation. You can also help prevent constipation by eating foods high in fiber such as fruits and vegetables and drinking plenty of fluids as your diet allows.  After surgery, you can help prevent lung complications by doing breathing exercises.  Take deep breaths and cough every 1-2 hours. Your doctor may order a device called an Incentive Spirometer to help you  take deep breaths. When coughing or sneezing, hold a pillow firmly against your incision with both hands. This is called "splinting." Doing this helps protect your incision. It also decreases belly discomfort.  If you are being admitted to the hospital overnight, leave your suitcase in the car. After surgery it may be brought to your room.  If you are being discharged the day of surgery, you will not be allowed to drive home. You will need a responsible  adult (18 years or older) to drive you home and stay with you that night.   If you are taking public transportation, you will need to have a responsible adult (18 years or older) with you. Please confirm with your physician that it is acceptable to use public transportation.   Please call the [facility] Dept. at [phone] if you have any questions about these instructions.  Surgery Visitation Policy:  Patients undergoing a surgery or procedure may have one family member or support person with them as long as that person is not COVID-19 positive or experiencing its symptoms.  That person may remain in the waiting area during the procedure.  Inpatient Visitation:    Visiting hours are 7 a.m. to 8 p.m. Inpatients will be allowed two visitors daily. The visitors may change each day during the patient's stay. No visitors under the age of 52. Any visitor under the age of 83 must be accompanied by an adult. The visitor must pass COVID-19 screenings, use hand sanitizer when entering and exiting the patient's room and wear a mask at all times, including in the patient's room. Patients must also wear a mask when staff or their visitor are in the room. Masking is required regardless of vaccination status.

## 2020-04-12 NOTE — Telephone Encounter (Signed)
Spoke with the patient and he is scheduled with Dr. Delana Meyer for a excision lower abdomen AV malformation on 04/14/20 at the [facility]. Phone call pre-op today and covid testing on 04/13/20 between 8-10 am at the [facility]. Pre-surgical instructions were discussed.

## 2020-04-13 ENCOUNTER — Encounter
Admission: RE | Admit: 2020-04-13 | Discharge: 2020-04-13 | Disposition: A | Discharge status: Home / Self Care | Payer: BC Managed Care – PPO | Source: Ambulatory Visit | Attending: Vascular Surgery | Admitting: Vascular Surgery

## 2020-04-13 ENCOUNTER — Encounter (INDEPENDENT_AMBULATORY_CARE_PROVIDER_SITE_OTHER): Payer: Self-pay | Admitting: Vascular Surgery

## 2020-04-13 ENCOUNTER — Telehealth: Payer: Self-pay

## 2020-04-13 ENCOUNTER — Other Ambulatory Visit: Admission: RE | Admit: 2020-04-13 | Payer: BC Managed Care – PPO | Source: Ambulatory Visit

## 2020-04-13 DIAGNOSIS — Z01812 Encounter for preprocedural laboratory examination: Secondary | ICD-10-CM | POA: Insufficient documentation

## 2020-04-13 DIAGNOSIS — E781 Pure hyperglyceridemia: Secondary | ICD-10-CM | POA: Diagnosis not present

## 2020-04-13 DIAGNOSIS — I10 Essential (primary) hypertension: Secondary | ICD-10-CM | POA: Diagnosis not present

## 2020-04-13 DIAGNOSIS — Z888 Allergy status to other drugs, medicaments and biological substances status: Secondary | ICD-10-CM | POA: Diagnosis not present

## 2020-04-13 DIAGNOSIS — E1140 Type 2 diabetes mellitus with diabetic neuropathy, unspecified: Secondary | ICD-10-CM | POA: Diagnosis not present

## 2020-04-13 DIAGNOSIS — Z20822 Contact with and (suspected) exposure to covid-19: Secondary | ICD-10-CM | POA: Insufficient documentation

## 2020-04-13 DIAGNOSIS — L02211 Cutaneous abscess of abdominal wall: Secondary | ICD-10-CM | POA: Diagnosis not present

## 2020-04-13 DIAGNOSIS — D1809 Hemangioma of other sites: Secondary | ICD-10-CM | POA: Diagnosis not present

## 2020-04-13 HISTORY — DX: Barrett's esophagus without dysplasia: K22.70

## 2020-04-13 LAB — CBC WITH DIFFERENTIAL/PLATELET
Abs Immature Granulocytes: 0.02 10*3/uL (ref 0.00–0.07)
Basophils Absolute: 0.1 10*3/uL (ref 0.0–0.1)
Basophils Relative: 1 %
Eosinophils Absolute: 0.1 10*3/uL (ref 0.0–0.5)
Eosinophils Relative: 1 %
HCT: 42 % (ref 39.0–52.0)
Hemoglobin: 14.7 g/dL (ref 13.0–17.0)
Immature Granulocytes: 0 %
Lymphocytes Relative: 24 %
Lymphs Abs: 1.8 10*3/uL (ref 0.7–4.0)
MCH: 29.1 pg (ref 26.0–34.0)
MCHC: 35 g/dL (ref 30.0–36.0)
MCV: 83 fL (ref 80.0–100.0)
Monocytes Absolute: 1.1 10*3/uL — ABNORMAL HIGH (ref 0.1–1.0)
Monocytes Relative: 15 %
Neutro Abs: 4.4 10*3/uL (ref 1.7–7.7)
Neutrophils Relative %: 59 %
Platelets: 250 10*3/uL (ref 150–400)
RBC: 5.06 MIL/uL (ref 4.22–5.81)
RDW: 12.7 % (ref 11.5–15.5)
WBC: 7.5 10*3/uL (ref 4.0–10.5)
nRBC: 0 % (ref 0.0–0.2)

## 2020-04-13 LAB — APTT: aPTT: 33 seconds (ref 24–36)

## 2020-04-13 LAB — TYPE AND SCREEN
ABO/RH(D): A POS
Antibody Screen: NEGATIVE

## 2020-04-13 LAB — PROTIME-INR
INR: 1.1 (ref 0.8–1.2)
Prothrombin Time: 13.3 seconds (ref 11.4–15.2)

## 2020-04-13 NOTE — Telephone Encounter (Signed)
Howard Young is FU after they faxed over clearance in PreAdmit back...pls Fax [phone] Wanting to get form over to Preadmitting as procedure is tomorrow. FU

## 2020-04-13 NOTE — Telephone Encounter (Signed)
Copied from [place] [phone]. Topic: General - Other >> Apr 12, 2020  2:56 PM Lennox Solders wrote: Reason for CRM: sherry with [facility] pre-admitting testing is calling to let dr Caryn Section know she is faxing medical clearance for over

## 2020-04-13 NOTE — Telephone Encounter (Signed)
Completed and taken to medical records to fax.

## 2020-04-13 NOTE — Patient Instructions (Signed)
Your procedure is scheduled on:  Wednesday, March 2 Report to the Registration Desk on the 1st floor of the [facility]. To find out your arrival time, please call [phone] between 1PM - 3PM on: Tuesday, March 1  REMEMBER: Instructions that are not followed completely may result in serious medical risk, up to and including death; or upon the discretion of your surgeon and anesthesiologist your surgery may need to be rescheduled.  Do not eat food after midnight the night before surgery.  No gum chewing, lozengers or hard candies.  You may however, drink water up to 2 hours before you are scheduled to arrive for your surgery. Do not drink anything within 2 hours of your scheduled arrival time.  DO NOT TAKE ANY MEDICATIONS THE MORNING OF SURGERY  Stop Metformin 2 days prior to surgery.   Take 1/2 of usual insulin dose the night before surgery and none on the morning of surgery. ONLY TAKE 35 UNITS OF TOUJEO THE NIGHT BEFORE SURGERY.  One week prior to surgery: Stop Anti-inflammatories (NSAIDS) such as Advil, Aleve, Ibuprofen, Motrin, Naproxen, Naprosyn and Aspirin based products such as Excedrin, Goodys Powder, BC Powder. Stop ANY OVER THE COUNTER supplements until after surgery.  No Alcohol for 24 hours before or after surgery.  No Smoking including e-cigarettes for 24 hours prior to surgery.  No chewable tobacco products for at least 6 hours prior to surgery.  No nicotine patches on the day of surgery.  Do not use any "recreational" drugs for at least a week prior to your surgery.  Please be advised that the combination of cocaine and anesthesia may have negative outcomes, up to and including death. If you test positive for cocaine, your surgery will be cancelled.  On the morning of surgery brush your teeth with toothpaste and water, you may rinse your mouth with mouthwash if you wish. Do not swallow any toothpaste or mouthwash.  Do not wear jewelry, make-up, hairpins, clips  or nail polish.  Do not wear lotions, powders, or perfumes.   Do not shave body from the neck down 48 hours prior to surgery just in case you cut yourself which could leave a site for infection.  Also, freshly shaved skin may become irritated if using the CHG soap.  Contact lenses, hearing aids and dentures may not be worn into surgery.  Do not bring valuables to the hospital. [facility] is not responsible for any missing/lost belongings or valuables.   Use CHG Soap as directed on instruction sheet.  Bring your C-PAP to the hospital with you in case you may have to spend the night.   Notify your doctor if there is any change in your medical condition (cold, fever, infection).  Wear comfortable clothing (specific to your surgery type) to the hospital.  Plan for stool softeners for home use; pain medications have a tendency to cause constipation. You can also help prevent constipation by eating foods high in fiber such as fruits and vegetables and drinking plenty of fluids as your diet allows.  After surgery, you can help prevent lung complications by doing breathing exercises.  Take deep breaths and cough every 1-2 hours. Your doctor may order a device called an Incentive Spirometer to help you take deep breaths. When coughing or sneezing, hold a pillow firmly against your incision with both hands. This is called "splinting." Doing this helps protect your incision. It also decreases belly discomfort.  If you are being discharged the day of surgery, you will  not be allowed to drive home. You will need a responsible adult (18 years or older) to drive you home and stay with you that night.   If you are taking public transportation, you will need to have a responsible adult (18 years or older) with you. Please confirm with your physician that it is acceptable to use public transportation.   Please call the [facility] Dept. at [phone] if you have any questions about  these instructions.  Surgery Visitation Policy:  Patients undergoing a surgery or procedure may have one family member or support person with them as long as that person is not COVID-19 positive or experiencing its symptoms.  That person may remain in the waiting area during the procedure.

## 2020-04-14 ENCOUNTER — Ambulatory Visit: Payer: BC Managed Care – PPO | Admitting: Anesthesiology

## 2020-04-14 ENCOUNTER — Encounter
Admission: RE | Disposition: A | Discharge status: Home / Self Care | Payer: Self-pay | Source: Ambulatory Visit | Attending: Vascular Surgery

## 2020-04-14 ENCOUNTER — Encounter: Payer: Self-pay | Admitting: Vascular Surgery

## 2020-04-14 ENCOUNTER — Ambulatory Visit
Admission: RE | Admit: 2020-04-14 | Discharge: 2020-04-14 | Disposition: A | Discharge status: Home / Self Care | Payer: BC Managed Care – PPO | Source: Ambulatory Visit | Attending: Vascular Surgery | Admitting: Vascular Surgery

## 2020-04-14 DIAGNOSIS — Z888 Allergy status to other drugs, medicaments and biological substances status: Secondary | ICD-10-CM | POA: Diagnosis not present

## 2020-04-14 DIAGNOSIS — D1809 Hemangioma of other sites: Secondary | ICD-10-CM | POA: Diagnosis not present

## 2020-04-14 DIAGNOSIS — E1140 Type 2 diabetes mellitus with diabetic neuropathy, unspecified: Secondary | ICD-10-CM | POA: Insufficient documentation

## 2020-04-14 DIAGNOSIS — G4730 Sleep apnea, unspecified: Secondary | ICD-10-CM | POA: Diagnosis not present

## 2020-04-14 DIAGNOSIS — L02211 Cutaneous abscess of abdominal wall: Secondary | ICD-10-CM | POA: Diagnosis not present

## 2020-04-14 DIAGNOSIS — I10 Essential (primary) hypertension: Secondary | ICD-10-CM | POA: Diagnosis not present

## 2020-04-14 DIAGNOSIS — Z20822 Contact with and (suspected) exposure to covid-19: Secondary | ICD-10-CM | POA: Insufficient documentation

## 2020-04-14 DIAGNOSIS — E781 Pure hyperglyceridemia: Secondary | ICD-10-CM | POA: Insufficient documentation

## 2020-04-14 DIAGNOSIS — E7800 Pure hypercholesterolemia, unspecified: Secondary | ICD-10-CM | POA: Diagnosis not present

## 2020-04-14 DIAGNOSIS — D1800 Hemangioma unspecified site: Secondary | ICD-10-CM | POA: Diagnosis not present

## 2020-04-14 DIAGNOSIS — R1909 Other intra-abdominal and pelvic swelling, mass and lump: Secondary | ICD-10-CM | POA: Diagnosis not present

## 2020-04-14 DIAGNOSIS — Q2733 Arteriovenous malformation of digestive system vessel: Secondary | ICD-10-CM | POA: Diagnosis not present

## 2020-04-14 HISTORY — PX: MASS EXCISION: SHX2000

## 2020-04-14 LAB — ABO/RH: ABO/RH(D): A POS

## 2020-04-14 LAB — GLUCOSE, CAPILLARY
Glucose-Capillary: 149 mg/dL — ABNORMAL HIGH (ref 70–99)
Glucose-Capillary: 145 mg/dL — ABNORMAL HIGH (ref 70–99)

## 2020-04-14 LAB — SARS CORONAVIRUS 2 (TAT 6-24 HRS): SARS Coronavirus 2: NEGATIVE

## 2020-04-14 SURGERY — EXCISION MASS
Anesthesia: General | Site: Abdomen

## 2020-04-14 MED ORDER — ONDANSETRON HCL 4 MG/2ML IJ SOLN
INTRAMUSCULAR | Status: DC | PRN
Start: 2020-04-14 — End: 2020-04-14
  Administered 2020-04-14: 4 mg via INTRAVENOUS

## 2020-04-14 MED ORDER — ONDANSETRON HCL 4 MG/2ML IJ SOLN: 4.0000 mg | Freq: Once | INTRAMUSCULAR | Status: DC | PRN

## 2020-04-14 MED ORDER — CEFAZOLIN SODIUM-DEXTROSE 2-4 GM/100ML-% IV SOLN
2.0000 g | INTRAVENOUS | Status: AC
  Administered 2020-04-14: 3 g via INTRAVENOUS

## 2020-04-14 MED ORDER — CEFAZOLIN SODIUM-DEXTROSE 2-4 GM/100ML-% IV SOLN
INTRAVENOUS | Status: AC
  Filled 2020-04-14: qty 100

## 2020-04-14 MED ORDER — FENTANYL CITRATE (PF) 100 MCG/2ML IJ SOLN
INTRAMUSCULAR | Status: AC
  Filled 2020-04-14: qty 2

## 2020-04-14 MED ORDER — FENTANYL CITRATE (PF) 100 MCG/2ML IJ SOLN
INTRAMUSCULAR | Status: DC | PRN
  Administered 2020-04-14 (×3): 50 ug via INTRAVENOUS

## 2020-04-14 MED ORDER — PROPOFOL 10 MG/ML IV BOLUS
INTRAVENOUS | Status: AC
  Filled 2020-04-14: qty 20

## 2020-04-14 MED ORDER — CHLORHEXIDINE GLUCONATE CLOTH 2 % EX PADS
6.0000 | MEDICATED_PAD | Freq: Once | CUTANEOUS | Status: AC
  Administered 2020-04-14: 6 via TOPICAL

## 2020-04-14 MED ORDER — CHLORHEXIDINE GLUCONATE CLOTH 2 % EX PADS: 6.0000 | MEDICATED_PAD | Freq: Once | CUTANEOUS | Status: DC

## 2020-04-14 MED ORDER — BUPIVACAINE HCL (PF) 0.5 % IJ SOLN
INTRAMUSCULAR | Status: AC
  Filled 2020-04-14: qty 30

## 2020-04-14 MED ORDER — MIDAZOLAM HCL 2 MG/2ML IJ SOLN
INTRAMUSCULAR | Status: DC | PRN
  Administered 2020-04-14: 2 mg via INTRAVENOUS

## 2020-04-14 MED ORDER — SUCCINYLCHOLINE CHLORIDE 20 MG/ML IJ SOLN
INTRAMUSCULAR | Status: DC | PRN
  Administered 2020-04-14: 180 mg via INTRAVENOUS

## 2020-04-14 MED ORDER — LIDOCAINE HCL (CARDIAC) PF 100 MG/5ML IV SOSY
PREFILLED_SYRINGE | INTRAVENOUS | Status: DC | PRN
  Administered 2020-04-14: 100 mg via INTRAVENOUS

## 2020-04-14 MED ORDER — FENTANYL CITRATE (PF) 100 MCG/2ML IJ SOLN: 25.0000 ug | INTRAMUSCULAR | Status: DC | PRN

## 2020-04-14 MED ORDER — PROPOFOL 10 MG/ML IV BOLUS
INTRAVENOUS | Status: DC | PRN
  Administered 2020-04-14 (×2): 250 mg via INTRAVENOUS

## 2020-04-14 MED ORDER — HYDROCODONE-ACETAMINOPHEN 5-325 MG PO TABS: 1.0000 | ORAL_TABLET | Freq: Four times a day (QID) | ORAL | 0 refills | Status: DC | PRN

## 2020-04-14 MED ORDER — SODIUM CHLORIDE 0.9 % IV SOLN: INTRAVENOUS | Status: DC

## 2020-04-14 MED ORDER — CHLORHEXIDINE GLUCONATE 0.12 % MT SOLN: 15.0000 mL | Freq: Once | OROMUCOSAL | Status: AC

## 2020-04-14 MED ORDER — FAMOTIDINE 20 MG PO TABS: 20.0000 mg | ORAL_TABLET | Freq: Once | ORAL | Status: AC

## 2020-04-14 MED ORDER — PHENYLEPHRINE HCL (PRESSORS) 10 MG/ML IV SOLN
INTRAVENOUS | Status: DC | PRN
  Administered 2020-04-14 (×2): 100 ug via INTRAVENOUS

## 2020-04-14 MED ORDER — FAMOTIDINE 20 MG PO TABS
ORAL_TABLET | ORAL | Status: AC
  Administered 2020-04-14: 20 mg via ORAL
  Filled 2020-04-14: qty 1

## 2020-04-14 MED ORDER — ROCURONIUM BROMIDE 100 MG/10ML IV SOLN
INTRAVENOUS | Status: DC | PRN
  Administered 2020-04-14: 10 mg via INTRAVENOUS

## 2020-04-14 MED ORDER — DEXAMETHASONE SODIUM PHOSPHATE 10 MG/ML IJ SOLN
INTRAMUSCULAR | Status: DC | PRN
  Administered 2020-04-14: 10 mg via INTRAVENOUS

## 2020-04-14 MED ORDER — BUPIVACAINE LIPOSOME 1.3 % IJ SUSP
INTRAMUSCULAR | Status: AC
  Filled 2020-04-14: qty 20

## 2020-04-14 MED ORDER — ORAL CARE MOUTH RINSE: 15.0000 mL | Freq: Once | OROMUCOSAL | Status: AC

## 2020-04-14 MED ORDER — CHLORHEXIDINE GLUCONATE 0.12 % MT SOLN
OROMUCOSAL | Status: AC
  Administered 2020-04-14: 15 mL via OROMUCOSAL
  Filled 2020-04-14: qty 15

## 2020-04-14 MED ORDER — BUPIVACAINE LIPOSOME 1.3 % IJ SUSP
INTRAMUSCULAR | Status: DC | PRN
  Administered 2020-04-14: 50 mL

## 2020-04-14 MED ORDER — MIDAZOLAM HCL 2 MG/2ML IJ SOLN
INTRAMUSCULAR | Status: AC
  Filled 2020-04-14: qty 2

## 2020-04-14 SURGICAL SUPPLY — 45 items
ADH SKN CLS APL DERMABOND .7 (GAUZE/BANDAGES/DRESSINGS) ×1
APL PRP STRL LF DISP 70% ISPRP (MISCELLANEOUS) ×1
BLADE SURG 15 STRL LF DISP TIS (BLADE) ×1 IMPLANT
BLADE SURG 15 STRL SS (BLADE) ×2
CANISTER SUCT 1200ML W/VALVE (MISCELLANEOUS) ×2 IMPLANT
CANISTER WOUND CARE 500ML ATS (WOUND CARE) IMPLANT
CHLORAPREP W/TINT 26 (MISCELLANEOUS) ×2 IMPLANT
COVER WAND RF STERILE (DRAPES) ×2 IMPLANT
DERMABOND ADVANCED (GAUZE/BANDAGES/DRESSINGS) ×1
DERMABOND ADVANCED .7 DNX12 (GAUZE/BANDAGES/DRESSINGS) ×1 IMPLANT
DRAPE LAPAROTOMY 100X77 ABD (DRAPES) ×2 IMPLANT
DRSG OPSITE POSTOP 4X6 (GAUZE/BANDAGES/DRESSINGS) ×2 IMPLANT
DRSG VAC ATS MED SENSATRAC (GAUZE/BANDAGES/DRESSINGS) IMPLANT
ELECT REM PT RETURN 9FT ADLT (ELECTROSURGICAL) ×2
ELECTRODE REM PT RTRN 9FT ADLT (ELECTROSURGICAL) ×1 IMPLANT
GAUZE SPONGE 4X4 12PLY STRL (GAUZE/BANDAGES/DRESSINGS) ×2 IMPLANT
GLOVE SURG ENC MOIS LTX SZ7.5 (GLOVE) ×2 IMPLANT
GLOVE SURG SYN 8.0 (GLOVE) ×2 IMPLANT
GLOVE SURG UNDER LTX SZ7.5 (GLOVE) ×2 IMPLANT
GOWN STRL REUS W/ TWL LRG LVL3 (GOWN DISPOSABLE) ×1 IMPLANT
GOWN STRL REUS W/ TWL XL LVL3 (GOWN DISPOSABLE) ×2 IMPLANT
GOWN STRL REUS W/TWL LRG LVL3 (GOWN DISPOSABLE) ×2
GOWN STRL REUS W/TWL XL LVL3 (GOWN DISPOSABLE) ×4
KIT TURNOVER KIT A (KITS) ×2 IMPLANT
LABEL OR SOLS (LABEL) ×2 IMPLANT
MANIFOLD NEPTUNE II (INSTRUMENTS) ×2 IMPLANT
NEEDLE HYPO 22GX1.5 SAFETY (NEEDLE) IMPLANT
NS IRRIG 500ML POUR BTL (IV SOLUTION) ×2 IMPLANT
PACK BASIN MINOR ARMC (MISCELLANEOUS) ×2 IMPLANT
SOL PREP PVP 2OZ (MISCELLANEOUS)
SOLUTION PREP PVP 2OZ (MISCELLANEOUS) IMPLANT
SPONGE LAP 18X18 RF (DISPOSABLE) ×2 IMPLANT
SUT ETHILON 3-0 FS-10 30 BLK (SUTURE)
SUT MNCRL+ 5-0 UNDYED PC-3 (SUTURE) IMPLANT
SUT MONOCRYL 5-0 (SUTURE)
SUT VIC AB 2-0 CT1 27 (SUTURE) ×2
SUT VIC AB 2-0 CT1 TAPERPNT 27 (SUTURE) ×1 IMPLANT
SUT VIC AB 2-0 SH 27 (SUTURE) ×2
SUT VIC AB 2-0 SH 27XBRD (SUTURE) ×1 IMPLANT
SUT VIC AB 3-0 SH 27 (SUTURE) ×4
SUT VIC AB 3-0 SH 27X BRD (SUTURE) ×2 IMPLANT
SUTURE EHLN 3-0 FS-10 30 BLK (SUTURE) IMPLANT
SWAB CULTURE AMIES ANAERIB BLU (MISCELLANEOUS) ×2 IMPLANT
SYR 20ML LL LF (SYRINGE) IMPLANT
SYR BULB IRRIG 60ML STRL (SYRINGE) ×2 IMPLANT

## 2020-04-14 NOTE — Interval H&P Note (Signed)
History and Physical Interval Note:  04/14/2020 10:07 AM  Howard Young  has presented today for surgery, with the diagnosis of AV MALFORMATION WITH HEMORRHAGE.  The various methods of treatment have been discussed with the patient and family. After consideration of risks, benefits and other options for treatment, the patient has consented to  Procedure(s): EXCISION MASS (EXCISION OF ABDOMINAL WALL MASS- AV MALFORMATION ) (N/A) as a surgical intervention.  The patient's history has been reviewed, patient examined, no change in status, stable for surgery.  I have reviewed the patient's chart and labs.  Questions were answered to the patient's satisfaction.     Hortencia Pilar

## 2020-04-14 NOTE — Anesthesia Procedure Notes (Signed)
Procedure Name: Intubation Date/Time: 04/14/2020 10:39 AM Performed by: Nelda Marseille, CRNA Pre-anesthesia Checklist: Patient identified, Patient being monitored, Timeout performed, Emergency Drugs available and Suction available Patient Re-evaluated:Patient Re-evaluated prior to induction Oxygen Delivery Method: Circle system utilized Preoxygenation: Pre-oxygenation with 100% oxygen Induction Type: IV induction Ventilation: Mask ventilation without difficulty Laryngoscope Size: Mac, 3 and McGraph Grade View: Grade I Tube type: Oral Tube size: 7.5 mm Number of attempts: 1 Airway Equipment and Method: Stylet Placement Confirmation: ETT inserted through vocal cords under direct vision,  positive ETCO2 and breath sounds checked- equal and bilateral Secured at: 21 cm Tube secured with: Tape Dental Injury: Teeth and Oropharynx as per pre-operative assessment

## 2020-04-14 NOTE — Progress Notes (Signed)
Patient was actively bleeding from area under gauze on right abdominal area.  Saturated gauze replaced, secured with adhesive strip.  OR nurse informed prior to transport.

## 2020-04-14 NOTE — Discharge Instructions (Signed)

## 2020-04-14 NOTE — Anesthesia Postprocedure Evaluation (Signed)
Anesthesia Post Note  Patient: Howard Young  Procedure(s) Performed: EXCISION MASS (EXCISION OF ABDOMINAL WALL MASS- AV MALFORMATION ) (N/A Abdomen)  Patient location during evaluation: PACU Anesthesia Type: General Level of consciousness: awake and alert Pain management: pain level controlled Vital Signs Assessment: post-procedure vital signs reviewed and stable Respiratory status: spontaneous breathing, nonlabored ventilation, respiratory function stable and patient connected to nasal cannula oxygen Cardiovascular status: blood pressure returned to baseline and stable Postop Assessment: no apparent nausea or vomiting Anesthetic complications: no   No complications documented.   Last Vitals:  Vitals:   04/14/20 1230 04/14/20 1251  BP: (!) 158/86 (!) 169/80  Pulse: 64 (!) 56  Resp: 16 18  Temp: (!) 36.3 C (!) 36.1 C  SpO2: 99% 99%    Last Pain:  Vitals:   04/14/20 1251  TempSrc: Temporal  PainSc: 0-No pain                 Arita Miss

## 2020-04-14 NOTE — Transfer of Care (Signed)
Immediate Anesthesia Transfer of Care Note  Patient: Howard Young  Procedure(s) Performed: EXCISION MASS (EXCISION OF ABDOMINAL WALL MASS- AV MALFORMATION ) (N/A Abdomen)  Patient Location: PACU  Anesthesia Type:General  Level of Consciousness: sedated  Airway & Oxygen Therapy: Patient Spontanous Breathing and Patient connected to face mask oxygen  Post-op Assessment: Report given to RN and Post -op Vital signs reviewed and stable  Post vital signs: Reviewed and stable  Last Vitals:  Vitals Value Taken Time  BP 117/68 04/14/20 1116  Temp    Pulse 58 04/14/20 1121  Resp 13 04/14/20 1121  SpO2 99 % 04/14/20 1121  Vitals shown include unvalidated device data.  Last Pain:  Vitals:   04/14/20 0948  TempSrc: Tympanic  PainSc: 0-No pain         Complications: No complications documented.

## 2020-04-14 NOTE — Anesthesia Preprocedure Evaluation (Addendum)
Anesthesia Evaluation  Patient identified by MRN, date of birth, ID band Patient awake    Reviewed: Allergy & Precautions, H&P , NPO status , Patient's Chart, lab work & pertinent test results  History of Anesthesia Complications Negative for: history of anesthetic complications  Airway Mallampati: II  TM Distance: >3 FB Neck ROM: Full    Dental  (+) Teeth Intact   Pulmonary sleep apnea and Continuous Positive Airway Pressure Ventilation , neg COPD,    breath sounds clear to auscultation       Cardiovascular hypertension, (-) angina(-) Past MI and (-) Cardiac Stents (-) dysrhythmias  Rhythm:regular Rate:Normal     Neuro/Psych PSYCHIATRIC DISORDERS Anxiety negative neurological ROS     GI/Hepatic neg GERD  ,(+) Hepatitis -, Unspecified  Endo/Other  diabetesMorbid obesity  Renal/GU negative Renal ROS  negative genitourinary   Musculoskeletal   Abdominal (+) + obese,   Peds negative pediatric ROS (+)  Hematology negative hematology ROS (+)   Anesthesia Other Findings Past Medical History: No date: Diabetes mellitus without complication (HCC) No date: GERD (gastroesophageal reflux disease) No date: Hypertension No date: Morbid obesity ([place]) No date: Sleep apnea  Past Surgical History: 04/25/2011: BREATH TEK H PYLORI     Comment:  Procedure: BREATH TEK H PYLORI;  Surgeon: Pedro Earls, MD;  Location: [facility];  Service: General;                Laterality: N/A; 2001: CHOLECYSTECTOMY 03/08/2020: COLONOSCOPY WITH PROPOFOL; N/A     Comment:  Procedure: COLONOSCOPY WITH PROPOFOL;  Surgeon: Lin Landsman, MD;  Location: [facility] END[facility];  Service:               Gastroenterology;  Laterality: N/A; 03/08/2020: ESOPHAGOGASTRODUODENOSCOPY (EGD) WITH PROPOFOL     Comment:  Procedure: ESOPHAGOGASTRODUODENOSCOPY (EGD) WITH               PROPOFOL;  Surgeon: Lin Landsman, MD;   Location:               [facility];  Service: Gastroenterology;; 1993: heart ablation 2012: JOINT REPLACEMENT     Comment:  right hip 2008: [place] 11/09/2016: [place]; N/A     Comment:  Procedure: SCROTUM EXPLORATION;  Surgeon: Nickie Retort, MD;  Location: [facility] ORS;  Service: Urology;                Laterality: N/A;  BMI    Body Mass Index: 36.52 kg/m      Reproductive/Obstetrics negative OB ROS                            Anesthesia Physical  Anesthesia Plan  ASA: III  Anesthesia Plan: General   Post-op Pain Management:    Induction: Intravenous  PONV Risk Score and Plan: 2 and Ondansetron, Dexamethasone and Midazolam  Airway Management Planned: Oral ETT  Additional Equipment: None  Intra-op Plan:   Post-operative Plan: Extubation in OR  Informed Consent: I have reviewed the patients History and Physical, chart, labs and discussed the procedure including the risks, benefits and alternatives for the proposed anesthesia with the patient or authorized representative who has indicated his/her understanding and acceptance.  Dental Advisory Given  Plan Discussed with: Anesthesiologist, CRNA and Surgeon  Anesthesia Plan Comments: (Discussed risks of anesthesia with patient, including PONV, sore throat, lip/dental damage. Rare risks discussed as well, such as cardiorespiratory and neurological sequelae. Patient understands.)       Anesthesia Quick Evaluation

## 2020-04-14 NOTE — Op Note (Signed)
    OPERATIVE NOTE   PROCEDURE: Excision abdominal wall mass  PRE-OPERATIVE DIAGNOSIS: Abdominal wall abscess/abdominal wall AV malformation  POST-OPERATIVE DIAGNOSIS: Same  SURGEON: Hortencia Pilar  ASSISTANT(S): Ms. Hezzie Bump  ANESTHESIA: general  ESTIMATED BLOOD LOSS: Less than 10 cc  FINDING(S): Mass approximately 2 cm circular not adherent to the fascia below 1 dominant bleeding vessel identified and ligated  SPECIMEN(S): Abdominal wall mass to pathology for permanent section  INDICATIONS:   Howard Young is a 60 y.o. male who presents with a bleeding mass below the level of the umbilicus.  At the time of initial evaluation I also identified purulence and surrounding cellulitis.  He had been placed on doxycycline with significant improvement.  He is continued to ooze and bleed and therefore today it is being excised.  Risks and benefits were reviewed all questions been answered patient has agreed to proceed  DESCRIPTION: After full informed written consent was obtained from the patient, the patient was brought back to the operating room and placed supine upon the operating table.  Prior to induction, the patient received IV antibiotics.   After obtaining adequate anesthesia, the patient was then prepped and draped in the standard fashion for a excision of the abdominal wall mass..  Using a surgical marker an elliptical incision is mapped surrounding the mass in keeping with the lines of Langer hands.  Incision is made with a 15 blade scalpel and carried through the skin into the subcutaneous tissues.  The mass is then excised using Bovie cautery.  A dominant feeding artery is encountered toward the base.  This is initially controlled with Bovie cautery and then ligated with a interrupted figure-of-eight 2-0 Vicryl.  Exparel and Marcaine is then infiltrated into the skin edge and the soft tissues.  The wound is then closed in layers using 2-0 Vicryl followed by 3-0  Vicryl followed by 4-0 Monocryl subcuticular and Dermabond.  Once the Dermabond is dried a honeycomb dressing is applied.     The patient tolerated this procedure well.   COMPLICATIONS: None  CONDITION: [name]  Office: [phone]   04/14/2020, 11:04 AM

## 2020-04-15 ENCOUNTER — Encounter: Payer: Self-pay | Admitting: Vascular Surgery

## 2020-04-15 LAB — SURGICAL PATHOLOGY

## 2020-04-22 ENCOUNTER — Ambulatory Visit (INDEPENDENT_AMBULATORY_CARE_PROVIDER_SITE_OTHER): Payer: BC Managed Care – PPO | Admitting: Nurse Practitioner

## 2020-04-22 ENCOUNTER — Encounter (INDEPENDENT_AMBULATORY_CARE_PROVIDER_SITE_OTHER): Payer: Self-pay | Admitting: Vascular Surgery

## 2020-04-22 ENCOUNTER — Other Ambulatory Visit: Payer: Self-pay

## 2020-04-22 VITALS — BP 163/79 | HR 61 | Resp 16 | Wt 318.4 lb

## 2020-04-22 DIAGNOSIS — E0840 Diabetes mellitus due to underlying condition with diabetic neuropathy, unspecified: Secondary | ICD-10-CM

## 2020-04-22 DIAGNOSIS — L02211 Cutaneous abscess of abdominal wall: Secondary | ICD-10-CM

## 2020-05-09 ENCOUNTER — Encounter (INDEPENDENT_AMBULATORY_CARE_PROVIDER_SITE_OTHER): Payer: Self-pay | Admitting: Nurse Practitioner

## 2020-05-09 NOTE — Progress Notes (Signed)
Subjective:    Patient ID: Howard Young, male    DOB: 11/13/1960, 60 y.o.   MRN: [id number] Chief Complaint  Patient presents with  . 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  The patient notes that he has done well following the intervention.  The patient still has his postoperative dressing.  Upon removal of the Dermabond is intact with no evidence of obvious dehiscence or bleeding.  Overall the patient has done well post procedure.   Review of Systems  Skin: Positive for wound.  Hematological: Does not bruise/bleed easily.  All other systems reviewed and are negative.      Objective:   Physical Exam Vitals reviewed.  HENT:     Head: Normocephalic.  Cardiovascular:     Rate and Rhythm: Normal rate.  Pulmonary:     Effort: Pulmonary effort is normal.  Neurological:     Mental Status: He is alert and oriented to person, place, and time.  Psychiatric:        Mood and Affect: Mood normal.        Behavior: Behavior normal.        Thought Content: Thought content normal.        Judgment: Judgment normal.     BP (!) 163/79 (BP Location: Right Arm)   Pulse 61   Resp 16   Wt (!) 318 lb 6.4 oz (144.4 kg)   BMI 38.76 kg/m   Past Medical History:  Diagnosis Date  . Barrett esophagus   . Diabetes mellitus without complication ([place])   . GERD (gastroesophageal reflux disease)   . Hypertension   . Morbid obesity ([place])   . Pneumonia   . Sleep apnea     Social History   Socioeconomic History  . Marital status: Married    Spouse name: Barbera Setters  . Number of children: Not on file  . Years of education: Not on file  . Highest education level: Not on file  Occupational History  . Not on file  Tobacco Use  . Smoking status: Never Smoker  . Smokeless tobacco: Never Used  Vaping Use  . Vaping Use: Never used   Substance and Sexual Activity  . Alcohol use: No  . Drug use: No  . Sexual activity: Not on file  Other Topics Concern  . Not on file  Social History Narrative  . Not on file   Social Determinants of Health   Financial Resource Strain: Not on file  Food Insecurity: Not on file  Transportation Needs: Not on file  Physical Activity: Not on file  Stress: Not on file  Social Connections: Not on file  Intimate Partner Violence: Not on file    Past Surgical History:  Procedure Laterality Date  . BREATH TEK H PYLORI  04/25/2011   Procedure: BREATH TEK H PYLORI;  Surgeon: Pedro Earls, MD;  Location: [facility];  Service: General;  Laterality: N/A;  . CATARACT EXTRACTION, BILATERAL  2002  . CHOLECYSTECTOMY  2001  . COLONOSCOPY WITH PROPOFOL N/A 03/08/2020   Procedure: COLONOSCOPY WITH PROPOFOL;  Surgeon: Lin Landsman, MD;  Location[facility] END[facility];  Service: Gastroenterology;  Laterality: N/A;  . ESOPHAGOGASTRODUODENOSCOPY (EGD) WITH PROPOFOL  03/08/2020   Procedure: ESOPHAGOGASTRODUODENOSCOPY (EGD) WITH PROPOFOL;  Surgeon: Lin Landsman, MD;  Location: [facility] ENDOSCOPY;  Service: Gastroenterology;;  . heart  ablation  1993  . JOINT REPLACEMENT  2012   right hip  . LAPAROSCOPIC GASTRIC BANDING  2008  . MASS EXCISION N/A 04/14/2020   Procedure: EXCISION MASS (EXCISION OF ABDOMINAL WALL MASS- AV MALFORMATION );  Surgeon: Katha Cabal, MD;  Location: [facility] ORS;  Service: Vascular;  Laterality: N/A;  . SCROTAL EXPLORATION N/A 11/09/2016   Procedure: SCROTUM EXPLORATION;  Surgeon: Nickie Retort, MD;  Location: [facility] ORS;  Service: Urology;  Laterality: N/A;    Family History  Problem Relation Age of Onset  . Cancer Mother        breast, lung  . Diabetes Mother   . Heart failure Mother   . Cancer Father        colon  . Cancer Sister        breast  . Cancer Sister        ovarian    Allergies  Allergen Reactions  . Metoclopramide Hcl Shortness Of Breath  and Anxiety  . Glipizide     Profound weigh gain    CBC Latest Ref Rng & Units 04/13/2020 04/06/2020 12/30/2019  WBC 4.0 - 10.5 K/uL 7.5 8.9 9.8  Hemoglobin 13.0 - 17.0 g/dL 14.7 15.5 13.0  Hematocrit 39.0 - 52.0 % 42.0 44.6 38.6(L)  Platelets 150 - 400 K/uL 250 225 224      CMP     Component Value Date/Time   NA 131 (L) 04/06/2020 2151   NA 135 06/12/2014 1204   K 3.9 04/06/2020 2151   K 4.0 06/12/2014 1204   CL 99 04/06/2020 2151   CL 101 06/12/2014 1204   CO2 24 04/06/2020 2151   CO2 27 06/12/2014 1204   GLUCOSE 261 (H) 04/06/2020 2151   GLUCOSE 135 (H) 06/12/2014 1204   BUN 25 (H) 04/06/2020 2151   BUN 16 06/12/2014 1204   CREATININE 1.43 (H) 04/06/2020 2151   CREATININE 1.38 (H) 06/12/2014 1204   CALCIUM 9.2 04/06/2020 2151   CALCIUM 8.9 06/12/2014 1204   PROT 7.6 04/06/2020 2151   PROT 7.5 06/12/2014 1204   ALBUMIN 4.0 04/06/2020 2151   ALBUMIN 4.0 06/12/2014 1204   AST 43 (H) 04/06/2020 2151   AST 72 (H) 06/12/2014 1204   ALT 39 04/06/2020 2151   ALT 63 06/12/2014 1204   ALKPHOS 42 04/06/2020 2151   ALKPHOS 39 06/12/2014 1204   BILITOT 1.4 (H) 04/06/2020 2151   BILITOT 1.3 (H) 06/12/2014 1204   GFRNONAA 56 (L) 04/06/2020 2151   GFRNONAA 57 (L) 06/12/2014 1204   GFRAA >60 02/04/2018 1131   GFRAA >60 06/12/2014 1204     No results found.     Assessment & Plan:   1. Abscess of abdominal wall Currently the patient is doing well postoperatively.  The wound is clean dry and intact.  The patient is instructed that no further dressing is needed over the wound.  The glue should come off in time as the skin heals from below.  Patient will follow up with [place] on an as-needed basis.  2. Diabetes mellitus due to underlying condition with diabetic neuropathy, unspecified whether long term insulin use ([place]) Continue hypoglycemic medications as already ordered, these medications have been reviewed and there are no changes at this time.  Hgb A1C to be monitored as  already arranged by primary service    Current Outpatient Medications on File Prior to Visit  Medication Sig Dispense Refill  . Armodafinil (NUVIGIL) 150 MG tablet Take 1 tablet (150 mg total) by  mouth daily. 30 tablet 3  . B-D UF III MINI PEN NEEDLES 31G X 5 MM MISC SMARTSIG:1 Each SUB-Q Twice Daily    . doxycycline (VIBRAMYCIN) 100 MG capsule Take 1 capsule (100 mg total) by mouth 2 (two) times daily. 28 capsule 0  . HYDROcodone-acetaminophen (NORCO) 5-325 MG tablet Take 1-2 tablets by mouth every 6 (six) hours as needed. 50 tablet 0  . Insulin Pen Needle (B-D UF III MINI PEN NEEDLES) 31G X 5 MM MISC See admin instructions.    Marland Kitchen lisinopril-hydrochlorothiazide (ZESTORETIC) 20-12.5 MG tablet Take 2 tablets by mouth daily.    . metFORMIN (GLUCOPHAGE-XR) 500 MG 24 hr tablet Take 1,000 mg by mouth 2 (two) times daily.    . metoprolol succinate (TOPROL-XL) 50 MG 24 hr tablet TAKE 1 TABLET BY MOUTH EVERY DAY WITH OR IMMEDIATELY FOLLOWING FOOD (Patient taking differently: Take 50 mg by mouth at bedtime.) 90 tablet 0  . Multiple Vitamin (MULTIVITAMIN WITH MINERALS) TABS tablet Take 1 tablet by mouth daily.    . tamsulosin (FLOMAX) 0.4 MG CAPS capsule TAKE 1 CAPSULE BY MOUTH EVERY DAY (Patient taking differently: Take 0.4 mg by mouth daily.) 90 capsule 0  . TOUJEO SOLOSTAR 300 UNIT/ML Solostar Pen Inject 70 Units into the skin at bedtime.    . Continuous Blood Gluc Sensor (FREESTYLE LIBRE 2 SENSOR) MISC USE 1 KIT EVERY 14 (FOURTEEN) DAYS FOR GLUCOSE MONITORING (Patient not taking: No sig reported)    . [DISCONTINUED] insulin aspart (NOVOLOG) 100 UNIT/ML injection Inject 60 Units into the skin at bedtime. Patient taking 30 - 60 units daily      No current facility-administered medications on file prior to visit.    There are no Patient Instructions on file for this visit. No follow-ups on file.   Kris Hartmann, NP

## 2020-05-17 DIAGNOSIS — R790 Abnormal level of blood mineral: Secondary | ICD-10-CM | POA: Diagnosis not present

## 2020-05-17 DIAGNOSIS — Z794 Long term (current) use of insulin: Secondary | ICD-10-CM | POA: Diagnosis not present

## 2020-05-17 DIAGNOSIS — E1142 Type 2 diabetes mellitus with diabetic polyneuropathy: Secondary | ICD-10-CM | POA: Diagnosis not present

## 2020-05-17 DIAGNOSIS — I152 Hypertension secondary to endocrine disorders: Secondary | ICD-10-CM | POA: Diagnosis not present

## 2020-05-17 DIAGNOSIS — E785 Hyperlipidemia, unspecified: Secondary | ICD-10-CM | POA: Diagnosis not present

## 2020-05-17 DIAGNOSIS — E1169 Type 2 diabetes mellitus with other specified complication: Secondary | ICD-10-CM | POA: Diagnosis not present

## 2020-05-17 DIAGNOSIS — E1159 Type 2 diabetes mellitus with other circulatory complications: Secondary | ICD-10-CM | POA: Diagnosis not present

## 2020-05-17 LAB — MICROALBUMIN / CREATININE URINE RATIO: Microalb Creat Ratio: 9.3

## 2020-08-13 ENCOUNTER — Other Ambulatory Visit: Payer: Self-pay | Admitting: Family Medicine

## 2020-08-13 NOTE — Telephone Encounter (Signed)
  Notes to clinic:  script requested has expired  Review for continued use and refill    Requested Prescriptions  Pending Prescriptions Disp Refills   tamsulosin (FLOMAX) 0.4 MG CAPS capsule [Pharmacy Med Name: TAMSULOSIN HCL 0.4 MG CAPSULE] 90 capsule 0    Sig: TAKE 1 CAPSULE BY MOUTH EVERY DAY      Urology: Alpha-Adrenergic Blocker Failed - 08/13/2020  8:35 AM      Failed - Last BP in normal range    BP Readings from Last 1 Encounters:  04/22/20 (!) 163/79          Passed - Valid encounter within last 12 months    Recent Outpatient Visits           6 months ago Candida infection   [place], [place], PA-C   7 months ago Diabetes mellitus due to underlying condition with diabetic neuropathy, unspecified whether long term insulin use [facility])   [facility] Birdie Sons, MD   1 year ago Periumbilical mass   [facility] [place], [place], [place]   2 years ago Need for shingles vaccine   [facility] Birdie Sons, MD   2 years ago Diabetes mellitus due to underlying condition with diabetic neuropathy, unspecified whether long term insulin use [facility])   [place], [place], [place]

## 2020-09-11 ENCOUNTER — Other Ambulatory Visit: Payer: Self-pay | Admitting: Family Medicine

## 2020-09-11 NOTE — Telephone Encounter (Signed)
Last RF 08/13/20 # 30 courtesy RF. Pt due for office visit.sent pt message via MyChart. Requested Prescriptions  Pending Prescriptions Disp Refills   tamsulosin (FLOMAX) 0.4 MG CAPS capsule [Pharmacy Med Name: TAMSULOSIN HCL 0.4 MG CAPSULE] 30 capsule 0    Sig: TAKE 1 CAPSULE BY MOUTH EVERY DAY      Urology: Alpha-Adrenergic Blocker Failed - 09/11/2020  8:32 AM      Failed - Last BP in normal range    BP Readings from Last 1 Encounters:  04/22/20 (!) 163/79          Passed - Valid encounter within last 12 months    Recent Outpatient Visits           7 months ago Candida infection   [place], [place], PA-C   8 months ago Diabetes mellitus due to underlying condition with diabetic neuropathy, unspecified whether long term insulin use [facility])   [facility] Birdie Sons, MD   1 year ago Periumbilical mass   [facility] [place], [place], [place]   2 years ago Need for shingles vaccine   [facility] Birdie Sons, MD   2 years ago Diabetes mellitus due to underlying condition with diabetic neuropathy, unspecified whether long term insulin use [facility])   [place], [place], [place]

## 2020-10-10 ENCOUNTER — Other Ambulatory Visit: Payer: Self-pay | Admitting: Family Medicine

## 2020-10-10 NOTE — Telephone Encounter (Signed)
Requested medication (s) are due for refill today: yes  Requested medication (s) are on the active medication list: yes  Last refill:  09/12/20  Future visit scheduled: no  Notes to clinic:  attempted to call pt and received the message " this mailbox invalid"  no other numbers listed in chart.   Requested Prescriptions  Pending Prescriptions Disp Refills   tamsulosin (FLOMAX) 0.4 MG CAPS capsule [Pharmacy Med Name: TAMSULOSIN HCL 0.4 MG CAPSULE] 30 capsule 0    Sig: TAKE 1 CAPSULE BY MOUTH EVERY DAY     Urology: Alpha-Adrenergic Blocker Failed - 10/10/2020 12:32 PM      Failed - Last BP in normal range    BP Readings from Last 1 Encounters:  04/22/20 (!) 163/79          Passed - Valid encounter within last 12 months    Recent Outpatient Visits           8 months ago Candida infection   [place], [place], PA-C   9 months ago Diabetes mellitus due to underlying condition with diabetic neuropathy, unspecified whether long term insulin use [facility])   [facility] Birdie Sons, MD   1 year ago Periumbilical mass   [facility] [place], [place], [place]   2 years ago Need for shingles vaccine   [facility] Birdie Sons, MD   2 years ago Diabetes mellitus due to underlying condition with diabetic neuropathy, unspecified whether long term insulin use [facility])   [place], [place], [place]

## 2020-10-28 ENCOUNTER — Other Ambulatory Visit: Payer: Self-pay

## 2020-10-28 ENCOUNTER — Ambulatory Visit: Payer: No Typology Code available for payment source | Admitting: Podiatry

## 2020-11-05 ENCOUNTER — Telehealth: Payer: Self-pay

## 2020-11-05 MED ORDER — TAMSULOSIN HCL 0.4 MG PO CAPS: 0.4000 mg | ORAL_CAPSULE | Freq: Every day | ORAL | 0 refills | Status: DC

## 2020-11-05 NOTE — Telephone Encounter (Signed)
[facility] faxed refill request for the following medications:  tamsulosin (FLOMAX) 0.4 MG CAPS capsule   Please advise.

## 2020-11-16 ENCOUNTER — Other Ambulatory Visit: Payer: Self-pay

## 2020-11-16 ENCOUNTER — Ambulatory Visit: Payer: No Typology Code available for payment source | Admitting: Podiatry

## 2020-11-16 ENCOUNTER — Encounter: Payer: Self-pay | Admitting: Podiatry

## 2020-11-16 DIAGNOSIS — M79675 Pain in left toe(s): Secondary | ICD-10-CM

## 2020-11-16 DIAGNOSIS — B351 Tinea unguium: Secondary | ICD-10-CM

## 2020-11-16 DIAGNOSIS — M79674 Pain in right toe(s): Secondary | ICD-10-CM

## 2020-11-16 DIAGNOSIS — E0840 Diabetes mellitus due to underlying condition with diabetic neuropathy, unspecified: Secondary | ICD-10-CM | POA: Diagnosis not present

## 2020-11-16 NOTE — Progress Notes (Signed)
  Subjective:  Patient ID: Howard Young, male    DOB: Jul 08, 1960,  MRN: [id number]  Chief Complaint  Patient presents with   Nail Problem   Diabetes    Nail trim [facility]  No complaints today   60 y.o. male returns for the above complaint.  Patient presents with thickened elongated dystrophic toenails x10.  Patient is a diabetic since 2001.  He states is not able to debride his nail down.  He wanted get it debrided down with ambulation he denies any other acute complaints.  Objective:  There were no vitals filed for this visit. Podiatric Exam: Vascular: dorsalis pedis and posterior tibial pulses are palpable bilateral. Capillary return is immediate. Temperature gradient is WNL. Skin turgor WNL  Sensorium: Normal Semmes Weinstein monofilament test. Normal tactile sensation bilaterally. Nail Exam: Pt has thick disfigured discolored nails with subungual debris noted bilateral entire nail hallux through fifth toenails.  Pain on palpation to the nails. Ulcer Exam: There is no evidence of ulcer or pre-ulcerative changes or infection. Orthopedic Exam: Muscle tone and strength are WNL. No limitations in general ROM. No crepitus or effusions noted.  Skin: No Porokeratosis. No infection or ulcers    Assessment & Plan:   1. Pain due to onychomycosis of toenails of both feet   2. Diabetes mellitus due to underlying condition with diabetic neuropathy, unspecified whether long term insulin use ([place])     Patient was evaluated and treated and all questions answered.  Onychomycosis with pain  -Nails palliatively debrided as below. -Educated on self-care  Procedure: Nail Debridement Rationale: pain  Type of Debridement: manual, sharp debridement. Instrumentation: Nail nipper, rotary burr. Number of Nails: 10  Procedures and Treatment: Consent by patient was obtained for treatment procedures. The patient understood the discussion of treatment and procedures well. All questions were answered  thoroughly reviewed. Debridement of mycotic and hypertrophic toenails, 1 through 5 bilateral and clearing of subungual debris. No ulceration, no infection noted.  Return Visit-Office Procedure: Patient instructed to return to the office for a follow up visit 3 months for continued evaluation and treatment.  Boneta Lucks, DPM    No follow-ups on file.

## 2020-11-23 ENCOUNTER — Ambulatory Visit: Payer: No Typology Code available for payment source | Admitting: Podiatry

## 2020-11-27 ENCOUNTER — Other Ambulatory Visit: Payer: Self-pay | Admitting: Family Medicine

## 2020-11-27 NOTE — Telephone Encounter (Signed)
Requested medications are due for refill today yes  Requested medications are on the active medication list yes  Last refill 11/05/20  Future visit scheduled 12/27  Notes to clinic had curtesy refill, upcoming appt not until 02/08/21, please assess.  Requested Prescriptions  Pending Prescriptions Disp Refills   tamsulosin (FLOMAX) 0.4 MG CAPS capsule [Pharmacy Med Name: Tamsulosin HCl 0.4 MG Oral Capsule] 30 capsule 11    Sig: TAKE 1 CAPSULE BY MOUTH  DAILY     Urology: Alpha-Adrenergic Blocker Failed - 11/27/2020  5:59 AM      Failed - Last BP in normal range    BP Readings from Last 1 Encounters:  04/22/20 (!) 163/79          Passed - Valid encounter within last 12 months    Recent Outpatient Visits           10 months ago Candida infection   [place], [place], PA-C   10 months ago Diabetes mellitus due to underlying condition with diabetic neuropathy, unspecified whether long term insulin use [facility])   [facility] Birdie Sons, MD   1 year ago Periumbilical mass   [facility] [place], [place], [place]   2 years ago Need for shingles vaccine   [facility] Birdie Sons, MD   2 years ago Diabetes mellitus due to underlying condition with diabetic neuropathy, unspecified whether long term insulin use [facility])   [place], [place], [place]       Future Appointments             In 2 months Fisher, Kirstie Peri, MD [facility], [place]

## 2020-12-30 ENCOUNTER — Encounter: Payer: Self-pay | Admitting: Emergency Medicine

## 2020-12-30 ENCOUNTER — Other Ambulatory Visit: Payer: Self-pay

## 2020-12-30 ENCOUNTER — Ambulatory Visit
Admission: EM | Admit: 2020-12-30 | Discharge: 2020-12-30 | Disposition: A | Discharge status: Home / Self Care | Payer: No Typology Code available for payment source

## 2020-12-30 DIAGNOSIS — S8992XA Unspecified injury of left lower leg, initial encounter: Secondary | ICD-10-CM

## 2020-12-30 NOTE — ED Provider Notes (Signed)
Chief Complaint   Chief Complaint  Patient presents with   Leg Injury     Subjective, HPI  OVAL CAVAZOS is a 60 y.o. male who presents with left lower leg injury that happened about 2 weeks ago.  Patient reports falling at the gym and hit his left shin on a weight.  Patient reports that the area produced a knot that has reduced in size but is still sore.  Patient does not report any warm or cool compresses applied to the area.  No shortness of breath, fever, chills, pus drainage reported.  No difficulty ambulating reported.  History obtained from patient.  Patient's problem list, past medical and social history, medications, and allergies were reviewed by me and updated in Epic.    ROS  See HPI.  Objective   Vitals:   12/30/20 0941  BP: (!) 144/82  Pulse: 79  Temp: 98.1 F (36.7 C)  SpO2: 96%    Vital signs and nursing note reviewed.   General: Appears well-developed and well-nourished. No acute distress.  Head: Normocephalic and atraumatic.   Neck: Normal range of motion, neck is supple.  Cardiovascular: Normal rate. Pulm/Chest: No respiratory distress.  Musculoskeletal: Left lower leg: Mild TTP noted to left shin with swelling of about 2cm x 2cm without drainage.  Area is soft. 5/5 strength, full sensation. Neurological: Alert and oriented to person, place, and time.  Skin: Skin is warm and dry.   Psychiatric: Normal mood, affect, behavior, and thought content.    Data  No results found for any visits on 12/30/20.   Imaging Offered. Patient declined.  Assessment & Plan  1. Injury of left lower leg, initial encounter  60 y.o. male presents with left lower leg injury that happened about 2 weeks ago.  Patient reports falling at the gym and hit his left shin on a weight.  Patient reports that the area produced a knot that has reduced in size but is still sore.  Patient does not report any warm or cool compresses to the area.  No shortness of breath, fever, chills,  pus drainage reported.  No difficulty ambulating reported.  Given symptoms along with assessment findings, likely swelling secondary to injury which is localized.  Advised at home treatment and care to include warm and cool compresses.  Did offer x-ray, patient declined.  Very low suspicion for any bony injury as the patient is not reporting any difficulty ambulating.  Pain is very localized area of swelling.  Also no red streaking or pus drainage concerning for infection.  Area should heal well on its own, if you do not notice significant improvement over the next 1 to 2 weeks please follow-up with your PCP for recheck.  Plan:   Discharge Instructions      Warm compress in the morning and at night with cool compresses throughout the day.  Follow-up with PCP in the next 1 to 2 weeks for recheck if area does not heal as expected.           Serafina Royals, [place] 12/30/20 1021

## 2020-12-30 NOTE — ED Triage Notes (Signed)
Pt fell at the gym 2 weeks ago and hit left shin on a weight. He has a knot that has reduced in size, but the area is still sore.

## 2020-12-30 NOTE — Discharge Instructions (Addendum)
Warm compress in the morning and at night with cool compresses throughout the day.  Follow-up with PCP in the next 1 to 2 weeks for recheck if area does not heal as expected.

## 2021-01-10 ENCOUNTER — Encounter: Payer: BC Managed Care – PPO | Admitting: Family Medicine

## 2021-01-17 LAB — HEMOGLOBIN A1C: Hemoglobin A1C: 6.4

## 2021-02-08 ENCOUNTER — Other Ambulatory Visit: Payer: Self-pay

## 2021-02-08 ENCOUNTER — Encounter: Payer: Self-pay | Admitting: Family Medicine

## 2021-02-08 ENCOUNTER — Ambulatory Visit (INDEPENDENT_AMBULATORY_CARE_PROVIDER_SITE_OTHER): Payer: No Typology Code available for payment source | Admitting: Family Medicine

## 2021-02-08 VITALS — BP 130/82 | HR 65 | Resp 16 | Ht 76.0 in | Wt 303.1 lb

## 2021-02-08 DIAGNOSIS — G4726 Circadian rhythm sleep disorder, shift work type: Secondary | ICD-10-CM

## 2021-02-08 DIAGNOSIS — Z6841 Body Mass Index (BMI) 40.0 and over, adult: Secondary | ICD-10-CM

## 2021-02-08 DIAGNOSIS — Z23 Encounter for immunization: Secondary | ICD-10-CM

## 2021-02-08 DIAGNOSIS — E0840 Diabetes mellitus due to underlying condition with diabetic neuropathy, unspecified: Secondary | ICD-10-CM

## 2021-02-08 DIAGNOSIS — Z0000 Encounter for general adult medical examination without abnormal findings: Secondary | ICD-10-CM | POA: Diagnosis not present

## 2021-02-08 DIAGNOSIS — Z125 Encounter for screening for malignant neoplasm of prostate: Secondary | ICD-10-CM | POA: Diagnosis not present

## 2021-02-08 DIAGNOSIS — Z1159 Encounter for screening for other viral diseases: Secondary | ICD-10-CM

## 2021-02-08 MED ORDER — ARMODAFINIL 150 MG PO TABS: 150.0000 mg | ORAL_TABLET | Freq: Every day | ORAL | 5 refills | Status: DC

## 2021-02-08 NOTE — Patient Instructions (Addendum)
Please review the attached list of medications and notify my office if there are any errors.   Please bring all of your medications to every appointment so we can make sure that our medication list is the same as yours.   I strongly recommend a Covid bivalent (omicron) booster if you have not yet had one. These are availably at most local pharmacies

## 2021-02-08 NOTE — Progress Notes (Signed)
Complete physical exam   Patient: Howard Young   DOB: [birth year]   60 y.o. Male  MRN: [id number] Visit Date: 02/08/2021  Today's healthcare provider: Lelon Huh, MD   Chief Complaint  Patient presents with   Annual Exam   Subjective    Howard Young is a 60 y.o. male who presents today for a complete physical exam.  He reports consuming a  regular, does intermittent fasting  diet. Home exercise routine includes walking. He generally feels well. He reports sleeping fairly well. He does not have additional problems to discuss today.  HPI  Per patient got flu shot a few weeks ago at work. He continues to follow up with Dr. Honor Junes for diabetes, with last a1c of 6.4 on 01/17/2021. He does request refill for Nuvigil which he has been out of for several months but was working well and was well tolerated.   Past Medical History:  Diagnosis Date   Barrett esophagus    Diabetes mellitus without complication (HCC)    GERD (gastroesophageal reflux disease)    Hypertension    Morbid obesity ([place])    Pneumonia    Sleep apnea    Past Surgical History:  Procedure Laterality Date   BREATH TEK H PYLORI  04/25/2011   Procedure: BREATH TEK H PYLORI;  Surgeon: Pedro Earls, MD;  Location: [facility];  Service: General;  Laterality: N/A;   CATARACT EXTRACTION, BILATERAL  2002   CHOLECYSTECTOMY  2001   COLONOSCOPY WITH PROPOFOL N/A 03/08/2020   Procedure: COLONOSCOPY WITH PROPOFOL;  Surgeon: Lin Landsman, MD;  Location: [facility];  Service: Gastroenterology;  Laterality: N/A;   ESOPHAGOGASTRODUODENOSCOPY (EGD) WITH PROPOFOL  03/08/2020   Procedure: ESOPHAGOGASTRODUODENOSCOPY (EGD) WITH PROPOFOL;  Surgeon: Lin Landsman, MD;  Location: [facility];  Service: Gastroenterology;;   heart ablation  [place]  2012   right hip   LAPAROSCOPIC GASTRIC BANDING  2008   MASS EXCISION N/A 04/14/2020   Procedure: EXCISION MASS (EXCISION OF ABDOMINAL  WALL MASS- AV MALFORMATION );  Surgeon: Katha Cabal, MD;  Location: [facility];  Service: Vascular;  Laterality: N/A;   SCROTAL EXPLORATION N/A 11/09/2016   Procedure: SCROTUM EXPLORATION;  Surgeon: Nickie Retort, MD;  Location: [facility];  Service: Urology;  Laterality: N/A;   Social History   Socioeconomic History   Marital status: Married    Spouse name: Sheri   Number of children: Not on file   Years of education: Not on file   Highest education level: Not on file  Occupational History   Not on file  Tobacco Use   Smoking status: Never   Smokeless tobacco: Never  Vaping Use   Vaping Use: Never used  Substance and Sexual Activity   Alcohol use: No   Drug use: No   Sexual activity: Not on file  Other Topics Concern   Not on file  Social History Narrative   Not on file   Social Determinants of Health   Financial Resource Strain: Not on file  Food Insecurity: Not on file  Transportation Needs: Not on file  Physical Activity: Not on file  Stress: Not on file  Social Connections: Not on file  Intimate Partner Violence: Not on file   Family Status  Relation Name Status   Mother  Deceased at age 29   Father  Deceased at age 4       suicide   Sister  Alive  Sister  Alive   Sister  Alive   Family History  Problem Relation Age of Onset   Cancer Mother        breast, lung   Diabetes Mother    Heart failure Mother    Cancer Father        colon   Cancer Sister        breast   Cancer Sister        ovarian   Allergies  Allergen Reactions   Metoclopramide Hcl Shortness Of Breath and Anxiety   Glipizide     Profound weigh gain    Patient Care Team: Birdie Sons, MD as PCP - General (Family Medicine) Lonia Farber, MD as Consulting Physician (Internal Medicine) Lin Landsman, MD as Consulting Physician (Gastroenterology)   Medications: Outpatient Medications Prior to Visit  Medication Sig   B-D UF III MINI PEN NEEDLES 31G X 5 MM  MISC SMARTSIG:1 Each SUB-Q Twice Daily   Continuous Blood Gluc Transmit (DEXCOM G6 TRANSMITTER) MISC USE TO MONITOR BLOOD SUGAR. REPLACE EVERY 3 MONTHS   gabapentin (NEURONTIN) 100 MG capsule Take by mouth.   Insulin Pen Needle (B-D UF III MINI PEN NEEDLES) 31G X 5 MM MISC See admin instructions.   lisinopril-hydrochlorothiazide (ZESTORETIC) 20-12.5 MG tablet Take 2 tablets by mouth daily.   metFORMIN (GLUCOPHAGE-XR) 500 MG 24 hr tablet Take 1,000 mg by mouth 2 (two) times daily.   Multiple Vitamin (MULTIVITAMIN WITH MINERALS) TABS tablet Take 1 tablet by mouth daily.   OZEMPIC, 0.25 OR 0.5 MG/DOSE, 2 MG/1.5ML SOPN Inject into the skin.   tamsulosin (FLOMAX) 0.4 MG CAPS capsule TAKE 1 CAPSULE BY MOUTH  DAILY   TOUJEO SOLOSTAR 300 UNIT/ML Solostar Pen Inject 70 Units into the skin at bedtime.   [DISCONTINUED] Armodafinil (NUVIGIL) 150 MG tablet Take 1 tablet (150 mg total) by mouth daily.   [DISCONTINUED] Continuous Blood Gluc Sensor (FREESTYLE LIBRE 2 SENSOR) MISC    No facility-administered medications prior to visit.    Review of Systems  Constitutional: Negative.   HENT:  Positive for tinnitus.   Eyes: Negative.   Respiratory: Negative.    Cardiovascular: Negative.   Gastrointestinal: Negative.   Genitourinary: Negative.   Musculoskeletal: Negative.   Skin: Negative.   Allergic/Immunologic: Negative.   Neurological:  Positive for dizziness.  Hematological: Negative.   Psychiatric/Behavioral: Negative.       Objective    BP (!) 150/82 (BP Location: Right Arm, Patient Position: Sitting, Cuff Size: Large)    Pulse 65    Resp 16    Ht 6\' 4"  (1.93 m)    Wt (!) 303 lb 1.6 oz (137.5 kg)    BMI 36.89 kg/m    Physical Exam   General Appearance:    Obese male. Alert, cooperative, in no acute distress, appears stated age  Head:    Normocephalic, without obvious abnormality, atraumatic  Eyes:    PERRL, conjunctiva/corneas clear, EOM's intact, fundi    benign, both eyes       Ears:     Normal TM's and external ear canals, both ears  Nose:   Nares normal, septum midline, mucosa normal, no drainage   or sinus tenderness  Back:     Symmetric, no curvature, ROM normal, no CVA tenderness  Lungs:     Clear to auscultation bilaterally, respirations unlabored  Chest wall:    No tenderness or deformity  Heart:    Normal heart rate. Normal rhythm. No murmurs, rubs, or  gallops.  S1 and S2 normal  Abdomen:     Soft, non-tender, bowel sounds active all four quadrants,    no masses, no organomegaly  Genitalia:    deferred  Rectal:    deferred  Extremities:   All extremities are intact. No cyanosis or edema  Pulses:   2+ and symmetric all extremities  Skin:   Skin color, texture, turgor normal, no rashes or lesions  Lymph nodes:   Cervical, supraclavicular, and axillary nodes normal  Neurologic:   CNII-XII intact. Normal strength, sensation and reflexes      throughout     Last depression screening scores PHQ 2/9 Scores 02/08/2021 01/21/2020 01/07/2020  PHQ - 2 Score 0 0 0  PHQ- 9 Score - 0 0   Last fall risk screening Fall Risk  01/21/2020  Falls in the past year? 0  Number falls in past yr: 0  Injury with Fall? 0  Risk for fall due to : No Fall Risks  Follow up Falls evaluation completed   Last Audit-C alcohol use screening Alcohol Use Disorder Test (AUDIT) 02/08/2021  1. How often do you have a drink containing alcohol? 0  2. How many drinks containing alcohol do you have on a typical day when you are drinking? 0  3. How often do you have six or more drinks on one occasion? 0  AUDIT-C Score 0   A score of 3 or more in women, and 4 or more in men indicates increased risk for alcohol abuse, EXCEPT if all of the points are from question 1   No results found for any visits on 02/08/21.  Assessment & Plan    Routine Health Maintenance and Physical Exam  Exercise Activities and Dietary recommendations  Goals   None     Immunization History  Administered Date(s)  Administered   Influenza Inj Mdck Quad Pf 12/01/2017   Influenza,inj,Quad PF,6+ Mos 11/28/2012, 02/23/2014, 11/22/2015, 10/23/2016, 01/07/2020   Influenza-Unspecified 11/14/2014, 11/09/2016   PFIZER(Purple Top)SARS-COV-2 Vaccination 04/20/2019, 05/13/2019, 01/21/2020   Pneumococcal Polysaccharide-23 11/28/2012   Zoster Recombinat (Shingrix) 02/04/2018, 04/08/2018    Health Maintenance  Topic Date Due   FOOT EXAM  Never done   Hepatitis C Screening  Never done   TETANUS/TDAP  Never done   Pneumococcal Vaccine 71-9 Years old (2 - PCV) 11/28/2013   OPHTHALMOLOGY EXAM  02/28/2019   COVID-19 Vaccine (4 - Booster for Pfizer series) 03/17/2020   HEMOGLOBIN A1C  07/06/2020   INFLUENZA VACCINE  09/13/2020   COLONOSCOPY (Pts 45-75yrs Insurance coverage will need to be confirmed)  03/09/2023   HIV Screening  Completed   Zoster Vaccines- Shingrix  Completed   HPV VACCINES  Aged Out    Discussed health benefits of physical activity, and encouraged him to engage in regular exercise appropriate for his age and condition.  ,1. Need for tetanus, diphtheria, and acellular pertussis (Tdap) vaccine in patient of adolescent age or older  - Administer Tetanus-diphtheria-acellular pertussis (Tdap) vaccine  Recommended covid vaccine booster from pharmacy. He reports he has already had flu vaccine.   2. Prostate cancer screening  - PSA Total (Reflex To Free) ([facility] only)  3. Need for hepatitis C screening test  - Hepatitis C antibody  4. Circadian rhythm sleep disorder, shift work type refill- Armodafinil (NUVIGIL) 150 MG tablet; Take 1 tablet (150 mg total) by mouth daily.  Dispense: 30 tablet; Refill: 5  5. Class 3 severe obesity with serious comorbidity and body mass index (BMI) of 40.0 to  44.9 in adult, unspecified obesity type ([place]) Encouraged healthy diet and regular exercis.e   6. Diabetes mellitus due to underlying condition with diabetic neuropathy, unspecified whether long term  insulin use ([place]) Continue routine follow up with Dr. Honor Junes. He is willing to start back on statin if lipids not at goal.   - CBC - Comprehensive metabolic panel - Lipid panel     The entirety of the information documented in the History of Present Illness, Review of Systems and Physical Exam were personally obtained by me. Portions of this information were initially documented by the CMA and reviewed by me for thoroughness and accuracy.     Lelon Huh, MD  [facility] [phone] (phone) [phone] (fax)  [place]

## 2021-02-09 LAB — PSA TOTAL (REFLEX TO FREE): Prostate Specific Ag, Serum: 0.9 ng/mL (ref 0.0–4.0)

## 2021-02-09 LAB — HEPATITIS C ANTIBODY: Hep C Virus Ab: <0.1 s/co ratio (ref 0.0–0.9)

## 2021-02-13 HISTORY — PX: MOHS SURGERY: SUR867

## 2021-02-17 ENCOUNTER — Other Ambulatory Visit: Payer: Self-pay

## 2021-02-17 ENCOUNTER — Encounter: Payer: Self-pay | Admitting: Podiatry

## 2021-02-17 ENCOUNTER — Ambulatory Visit: Payer: No Typology Code available for payment source | Admitting: Podiatry

## 2021-02-17 DIAGNOSIS — M79675 Pain in left toe(s): Secondary | ICD-10-CM | POA: Diagnosis not present

## 2021-02-17 DIAGNOSIS — E0840 Diabetes mellitus due to underlying condition with diabetic neuropathy, unspecified: Secondary | ICD-10-CM | POA: Diagnosis not present

## 2021-02-17 DIAGNOSIS — M79674 Pain in right toe(s): Secondary | ICD-10-CM | POA: Diagnosis not present

## 2021-02-17 DIAGNOSIS — B351 Tinea unguium: Secondary | ICD-10-CM

## 2021-02-17 NOTE — Progress Notes (Signed)
This patient returns to my office for at risk foot care.  This patient requires this care by a professional since this patient will be at risk due to having diabetes with neuropathy.  This patient is unable to cut nails himself since the patient cannot reach his nails.These nails are painful walking and wearing shoes.  This patient presents for at risk foot care today.  General Appearance  Alert, conversant and in no acute stress.  Vascular  Dorsalis pedis are palpable  bilaterally.  Posterior tibial pulses are weak due to swelling.Capillary return is within normal limits  bilaterally. Temperature is within normal limits  bilaterally.  Neurologic  Senn-Weinstein monofilament wire test within normal limits  bilaterally. Muscle power within normal limits bilaterally.  Nails Thick disfigured discolored nails with subungual debris  from hallux to fifth toes bilaterally. No evidence of bacterial infection or drainage bilaterally.  Orthopedic  No limitations of motion  feet .  No crepitus or effusions noted.  No bony pathology or digital deformities noted.  Skin  normotropic skin with no porokeratosis noted bilaterally.  No signs of infections or ulcers noted.     Onychomycosis  Pain in right toes  Pain in left toes  Consent was obtained for treatment procedures.   Mechanical debridement of nails 1-5  bilaterally performed with a nail nipper.  Filed with dremel without incident.    Return office visit    3 months                 Told patient to return for periodic foot care and evaluation due to potential at risk complications.   Gardiner Barefoot DPM

## 2021-03-08 ENCOUNTER — Other Ambulatory Visit: Payer: Self-pay | Admitting: Family Medicine

## 2021-03-08 DIAGNOSIS — G4726 Circadian rhythm sleep disorder, shift work type: Secondary | ICD-10-CM

## 2021-03-08 MED ORDER — ARMODAFINIL 150 MG PO TABS: 150.0000 mg | ORAL_TABLET | Freq: Every day | ORAL | 1 refills | Status: DC

## 2021-03-08 NOTE — Telephone Encounter (Signed)
Requested medication (s) are due for refill today: Yes  Requested medication (s) are on the active medication list: Yes  Last refill:  02/08/21  Future visit scheduled: No  Notes to clinic:  Unable to refill per protocol, cannot delegate. Patient wants Rx sent to mail order pharmacy    Requested Prescriptions  Pending Prescriptions Disp Refills   Armodafinil (NUVIGIL) 150 MG tablet 30 tablet 5    Sig: Take 1 tablet (150 mg total) by mouth daily.     Not Delegated - Psychiatry:  Stimulants/ADHD Failed - 03/08/2021 12:55 PM      Failed - This refill cannot be delegated      Failed - Urine Drug Screen completed in last 360 days      Passed - Valid encounter within last 3 months    Recent Outpatient Visits           4 weeks ago Annual physical exam   [facility] Birdie Sons, MD   1 year ago Candida infection   [facility] [place], [place], [place]   1 year ago Diabetes mellitus due to underlying condition with diabetic neuropathy, unspecified whether long term insulin use [facility])   [facility] Birdie Sons, MD   1 year ago Periumbilical mass   [facility] [place], [place]   2 years ago Need for shingles vaccine   [facility] Birdie Sons, MD

## 2021-03-08 NOTE — Telephone Encounter (Signed)
[facility] does not have Armodafinil (NUVIGIL) 150 MG tablet in stock for several months, patient would like script sent to   [facility] ) - [place], [place] Phone:  [phone]  Fax:  [phone]     Patient would like a follow up call when completed.

## 2021-03-08 NOTE — Telephone Encounter (Signed)
LOV: 02/08/2021  Thanks,   -Mickel Baas

## 2021-05-23 ENCOUNTER — Ambulatory Visit (INDEPENDENT_AMBULATORY_CARE_PROVIDER_SITE_OTHER): Payer: No Typology Code available for payment source | Admitting: Podiatry

## 2021-05-23 ENCOUNTER — Encounter: Payer: Self-pay | Admitting: Podiatry

## 2021-05-23 DIAGNOSIS — E0840 Diabetes mellitus due to underlying condition with diabetic neuropathy, unspecified: Secondary | ICD-10-CM

## 2021-05-23 DIAGNOSIS — M79674 Pain in right toe(s): Secondary | ICD-10-CM | POA: Diagnosis not present

## 2021-05-23 DIAGNOSIS — M79675 Pain in left toe(s): Secondary | ICD-10-CM

## 2021-05-23 DIAGNOSIS — B351 Tinea unguium: Secondary | ICD-10-CM

## 2021-05-23 NOTE — Progress Notes (Signed)
This patient returns to my office for at risk foot care.  This patient requires this care by a professional since this patient will be at risk due to having diabetes with neuropathy.  This patient is unable to cut nails himself since the patient cannot reach his nails.These nails are painful walking and wearing shoes.  This patient presents for at risk foot care today. ? ?General Appearance  Alert, conversant and in no acute stress. ? ?Vascular  Dorsalis pedis are palpable  bilaterally.  Posterior tibial pulses are weak due to swelling.Capillary return is within normal limits  bilaterally. Temperature is within normal limits  bilaterally. ? ?Neurologic  Senn-Weinstein monofilament wire test within normal limits  bilaterally. Muscle power within normal limits bilaterally. ? ?Nails Thick disfigured discolored nails with subungual debris  from hallux to fifth toes bilaterally. No evidence of bacterial infection or drainage bilaterally. ? ?Orthopedic  No limitations of motion  feet .  No crepitus or effusions noted.  No bony pathology or digital deformities noted. ? ?Skin  normotropic skin with no porokeratosis noted bilaterally.  No signs of infections or ulcers noted.    ? ?Onychomycosis  Pain in right toes  Pain in left toes ? ?Consent was obtained for treatment procedures.   Mechanical debridement of nails 1-5  bilaterally performed with a nail nipper.  Filed with dremel without incident.  ? ? ?Return office visit    3 months                 Told patient to return for periodic foot care and evaluation due to potential at risk complications. ? ? ?Gardiner Barefoot DPM   ?

## 2021-07-25 LAB — LIPID PANEL
Cholesterol: 158 (ref 0–200)
HDL: 43 (ref 35–70)
LDL Cholesterol: 85
Triglycerides: 149 (ref 40–160)

## 2021-07-25 LAB — TSH: TSH: 2.88 (ref 0.41–5.90)

## 2021-09-12 ENCOUNTER — Other Ambulatory Visit: Payer: Self-pay | Admitting: Family Medicine

## 2021-09-12 DIAGNOSIS — G4726 Circadian rhythm sleep disorder, shift work type: Secondary | ICD-10-CM

## 2021-09-13 NOTE — Telephone Encounter (Signed)
Last refill: 03/08/2021 #90 with 1 refill  Last office visit: 02/08/2021 No future appt scheduled

## 2021-09-22 ENCOUNTER — Ambulatory Visit (INDEPENDENT_AMBULATORY_CARE_PROVIDER_SITE_OTHER): Payer: No Typology Code available for payment source | Admitting: Podiatry

## 2021-09-22 ENCOUNTER — Encounter: Payer: Self-pay | Admitting: Podiatry

## 2021-09-22 DIAGNOSIS — M79674 Pain in right toe(s): Secondary | ICD-10-CM | POA: Diagnosis not present

## 2021-09-22 DIAGNOSIS — B351 Tinea unguium: Secondary | ICD-10-CM

## 2021-09-22 DIAGNOSIS — E0840 Diabetes mellitus due to underlying condition with diabetic neuropathy, unspecified: Secondary | ICD-10-CM | POA: Diagnosis not present

## 2021-09-22 DIAGNOSIS — M79675 Pain in left toe(s): Secondary | ICD-10-CM

## 2021-09-22 NOTE — Progress Notes (Signed)
This patient returns to my office for at risk foot care.  This patient requires this care by a professional since this patient will be at risk due to having diabetes with neuropathy.  This patient is unable to cut nails himself since the patient cannot reach his nails.These nails are painful walking and wearing shoes.  This patient presents for at risk foot care today.  General Appearance  Alert, conversant and in no acute stress.  Vascular  Dorsalis pedis are palpable  bilaterally.  Posterior tibial pulses are weak due to swelling.Capillary return is within normal limits  bilaterally. Temperature is within normal limits  bilaterally.  Neurologic  Senn-Weinstein monofilament wire test within normal limits  bilaterally. Muscle power within normal limits bilaterally.  Nails Thick disfigured discolored nails with subungual debris  from hallux to fifth toes bilaterally. No evidence of bacterial infection or drainage bilaterally.  Orthopedic  No limitations of motion  feet .  No crepitus or effusions noted.  No bony pathology or digital deformities noted.  Skin  normotropic skin with no porokeratosis noted bilaterally.  No signs of infections or ulcers noted.     Onychomycosis  Pain in right toes  Pain in left toes  Consent was obtained for treatment procedures.   Mechanical debridement of nails 1-5  bilaterally performed with a nail nipper.  Filed with dremel without incident.    Return office visit    4   months                 Told patient to return for periodic foot care and evaluation due to potential at risk complications.   Gardiner Barefoot DPM

## 2021-10-26 ENCOUNTER — Other Ambulatory Visit: Payer: Self-pay | Admitting: Family Medicine

## 2021-11-19 ENCOUNTER — Encounter: Payer: Self-pay | Admitting: Family Medicine

## 2021-12-12 ENCOUNTER — Encounter (INDEPENDENT_AMBULATORY_CARE_PROVIDER_SITE_OTHER): Payer: Self-pay

## 2021-12-19 ENCOUNTER — Ambulatory Visit: Payer: No Typology Code available for payment source | Admitting: Podiatry

## 2021-12-26 NOTE — Progress Notes (Deleted)
      Established patient visit   Patient: Howard Young   DOB: 04/19/1960   61 y.o. Male  MRN: [id number] Visit Date: 12/27/2021  Today's healthcare provider: Lelon Huh, MD   No chief complaint on file.  Subjective    Urinary Frequency  Associated symptoms include frequency.    ***  Medications: Outpatient Medications Prior to Visit  Medication Sig   Armodafinil 150 MG tablet TAKE 1 TABLET BY MOUTH DAILY   B-D UF III MINI PEN NEEDLES 31G X 5 MM MISC SMARTSIG:1 Each SUB-Q Twice Daily   Continuous Blood Gluc Transmit (DEXCOM G6 TRANSMITTER) MISC USE TO MONITOR BLOOD SUGAR. REPLACE EVERY 3 MONTHS   gabapentin (NEURONTIN) 100 MG capsule Take by mouth.   Insulin Pen Needle (B-D UF III MINI PEN NEEDLES) 31G X 5 MM MISC See admin instructions.   lisinopril-hydrochlorothiazide (ZESTORETIC) 20-12.5 MG tablet Take 2 tablets by mouth daily.   Magnesium 200 MG CHEW Chew 400 mg by mouth daily.   metFORMIN (GLUCOPHAGE) 500 MG tablet Take 1,000 mg by mouth 2 (two) times daily.   metFORMIN (GLUCOPHAGE-XR) 500 MG 24 hr tablet Take 1,000 mg by mouth 2 (two) times daily.   Multiple Vitamin (MULTIVITAMIN WITH MINERALS) TABS tablet Take 1 tablet by mouth daily.   OZEMPIC, 0.25 OR 0.5 MG/DOSE, 2 MG/1.5ML SOPN Inject into the skin.   tamsulosin (FLOMAX) 0.4 MG CAPS capsule TAKE 1 CAPSULE BY MOUTH  DAILY   TOUJEO SOLOSTAR 300 UNIT/ML Solostar Pen Inject 70 Units into the skin at bedtime.   No facility-administered medications prior to visit.    Review of Systems  Genitourinary:  Positive for frequency.    {Labs  Heme  Chem  Endocrine  Serology  Results Review (optional):23779}   Objective    There were no vitals taken for this visit. {Show previous vital signs (optional):23777}  Physical Exam  ***  No results found for any visits on 12/27/21.  Assessment & Plan     ***  No follow-ups on file.      {provider attestation***:1}   Lelon Huh, MD  [facility] [phone] (phone) [phone] (fax)  [place]

## 2021-12-27 ENCOUNTER — Ambulatory Visit: Payer: No Typology Code available for payment source | Admitting: Family Medicine

## 2021-12-27 ENCOUNTER — Encounter: Payer: Self-pay | Admitting: Physician Assistant

## 2021-12-27 ENCOUNTER — Ambulatory Visit (INDEPENDENT_AMBULATORY_CARE_PROVIDER_SITE_OTHER): Payer: No Typology Code available for payment source | Admitting: Physician Assistant

## 2021-12-27 VITALS — BP 140/80 | HR 78 | Ht 76.0 in | Wt 276.0 lb

## 2021-12-27 DIAGNOSIS — N529 Male erectile dysfunction, unspecified: Secondary | ICD-10-CM | POA: Diagnosis not present

## 2021-12-27 DIAGNOSIS — R7989 Other specified abnormal findings of blood chemistry: Secondary | ICD-10-CM | POA: Diagnosis not present

## 2021-12-27 DIAGNOSIS — I10 Essential (primary) hypertension: Secondary | ICD-10-CM

## 2021-12-27 DIAGNOSIS — R350 Frequency of micturition: Secondary | ICD-10-CM | POA: Insufficient documentation

## 2021-12-27 LAB — POCT URINALYSIS DIPSTICK
Bilirubin, UA: NEGATIVE
Blood, UA: NEGATIVE
Glucose, UA: NEGATIVE
Ketones, UA: NEGATIVE
Leukocytes, UA: NEGATIVE
Nitrite, UA: NEGATIVE
Protein, UA: POSITIVE — AB
Spec Grav, UA: 1.025 (ref 1.010–1.025)
Urobilinogen, UA: 0.2 E.U./dL
pH, UA: 6 (ref 5.0–8.0)

## 2021-12-27 MED ORDER — TADALAFIL 10 MG PO TABS: 10.0000 mg | ORAL_TABLET | ORAL | 1 refills | Status: DC | PRN

## 2021-12-27 NOTE — Assessment & Plan Note (Signed)
Historically Will recheck levels and refer back to pcp

## 2021-12-27 NOTE — Assessment & Plan Note (Signed)
UA + protein but - blood, leuk, nitrites Will run urine micro Continue tamsulosin Will check cmp, psa. Ref to urology. Pt has fhx prostate cancer.

## 2021-12-27 NOTE — Assessment & Plan Note (Signed)
Moderately controlled  Continue current medications Will check cmp/mag

## 2021-12-27 NOTE — Assessment & Plan Note (Signed)
Advised I can give a trial of Cialis. Further treatment should come from urology or pcp.

## 2021-12-27 NOTE — Progress Notes (Signed)
I,Sha'taria Tyson,acting as a Education administrator for [employer], PA-C.,have documented all relevant documentation on the behalf of Mikey Kirschner, PA-C,as directed by  Mikey Kirschner, PA-C while in the presence of Mikey Kirschner, PA-C.   Established patient visit   Patient: Howard Young   DOB: 06-25-1960   61 y.o. Male  MRN: [id number] Visit Date: 12/27/2021  Today's healthcare provider: Mikey Kirschner, PA-C   Cc. Urinary frequency x 1 + month  Subjective     Pt has concerns over urinary frequency x 1+ month. He takes tamsulosin originally for issues w/ flow and initiating stream. Reports nocturia and daytime frequency. Denies issues w/ flow denies issues with initiating. He reports starting OTC prostate supplements.  He also reports issues with dizziness, reports a 'severe' deficiency in magnesium, if he does not take a supplement daily he feels dizzy and has LOC from this years ago.   He also has concerns over erectile dysfunction. Unable to fully obtain an erection. He reports history of testosterone supplementation.  Medications: Outpatient Medications Prior to Visit  Medication Sig   Armodafinil 150 MG tablet TAKE 1 TABLET BY MOUTH DAILY   B-D UF III MINI PEN NEEDLES 31G X 5 MM MISC SMARTSIG:1 Each SUB-Q Twice Daily   Berberine-RedIsoAlphaAcd-D-K (OSTERA) TABS Take by mouth.   Continuous Blood Gluc Transmit (DEXCOM G6 TRANSMITTER) MISC USE TO MONITOR BLOOD SUGAR. REPLACE EVERY 3 MONTHS   gabapentin (NEURONTIN) 100 MG capsule Take by mouth.   Insulin Pen Needle (B-D UF III MINI PEN NEEDLES) 31G X 5 MM MISC See admin instructions.   lisinopril-hydrochlorothiazide (ZESTORETIC) 20-12.5 MG tablet Take 2 tablets by mouth daily.   Magnesium 200 MG CHEW Chew 400 mg by mouth daily.   metFORMIN (GLUCOPHAGE) 500 MG tablet Take 1,000 mg by mouth 2 (two) times daily.   metFORMIN (GLUCOPHAGE-XR) 500 MG 24 hr tablet Take 1,000 mg by mouth 2 (two) times daily.   Multiple Vitamin  (MULTIVITAMIN WITH MINERALS) TABS tablet Take 1 tablet by mouth daily.   OZEMPIC, 0.25 OR 0.5 MG/DOSE, 2 MG/1.5ML SOPN Inject into the skin.   tamsulosin (FLOMAX) 0.4 MG CAPS capsule TAKE 1 CAPSULE BY MOUTH  DAILY   TOUJEO SOLOSTAR 300 UNIT/ML Solostar Pen Inject 70 Units into the skin at bedtime.   No facility-administered medications prior to visit.    Review of Systems  Genitourinary:  Positive for frequency.     Objective    Blood pressure (!) 140/80, pulse 78, height '6\' 4"'$  (1.93 m), weight 276 lb (125.2 kg), SpO2 100 %.   Physical Exam Vitals reviewed.  Constitutional:      Appearance: He is not ill-appearing.  HENT:     Head: Normocephalic.  Eyes:     Conjunctiva/sclera: Conjunctivae normal.  Cardiovascular:     Rate and Rhythm: Normal rate.  Pulmonary:     Effort: Pulmonary effort is normal. No respiratory distress.  Neurological:     General: No focal deficit present.     Mental Status: He is alert and oriented to person, place, and time.  Psychiatric:        Mood and Affect: Mood normal.        Behavior: Behavior normal.      Results for orders placed or performed in visit on 12/27/21  POCT Urinalysis Dipstick  Result Value Ref Range   Color, UA     Clarity, UA     Glucose, UA Negative Negative   Bilirubin, UA negative  Ketones, UA negative    Spec Grav, UA 1.025 1.010 - 1.025   Blood, UA negative    pH, UA 6.0 5.0 - 8.0   Protein, UA Positive (A) Negative   Urobilinogen, UA 0.2 0.2 or 1.0 E.U./dL   Nitrite, UA negative    Leukocytes, UA Negative Negative   Appearance     Odor      Assessment & Plan     Problem List Items Addressed This Visit       Cardiovascular and Mediastinum   Essential hypertension    Moderately controlled  Continue current medications Will check cmp/mag      Relevant Medications   tadalafil (CIALIS) 10 MG tablet   Other Relevant Orders   Magnesium     Other   Urinary frequency - Primary    UA + protein but -  blood, leuk, nitrites Will run urine micro Continue tamsulosin Will check cmp, psa. Ref to urology. Pt has fhx prostate cancer.      Relevant Orders   POCT Urinalysis Dipstick (Completed)   PSA   Urinalysis, microscopic only   Comprehensive Metabolic Panel (CMET)   Ambulatory referral to Urology   Low testosterone    Historically Will recheck levels and refer back to pcp       Relevant Orders   Testosterone,Free and Total   Erectile dysfunction    Advised I can give a trial of Cialis. Further treatment should come from urology or pcp.      Relevant Medications   tadalafil (CIALIS) 10 MG tablet   Other Relevant Orders   Ambulatory referral to Urology    Return if symptoms worsen or fail to improve.      I, Mikey Kirschner, PA-C have reviewed all documentation for this visit. The documentation on  12/27/2021 for the exam, diagnosis, procedures, and orders are all accurate and complete.  Mikey Kirschner, PA-C [facility] [address] [place], [place], [address] Office: [phone] Fax: [place]

## 2021-12-28 LAB — URINALYSIS, MICROSCOPIC ONLY
Bacteria, UA: NONE SEEN
Casts: NONE SEEN /lpf
RBC, Urine: NONE SEEN /hpf (ref 0–2)
WBC, UA: NONE SEEN /hpf (ref 0–5)

## 2021-12-30 ENCOUNTER — Other Ambulatory Visit: Payer: Self-pay | Admitting: Physician Assistant

## 2021-12-30 DIAGNOSIS — R7401 Elevation of levels of liver transaminase levels: Secondary | ICD-10-CM

## 2021-12-30 LAB — PSA: Prostate Specific Ag, Serum: 1.1 ng/mL (ref 0.0–4.0)

## 2022-01-01 LAB — COMPREHENSIVE METABOLIC PANEL
ALT: 62 IU/L — ABNORMAL HIGH (ref 0–44)
AST: 60 IU/L — ABNORMAL HIGH (ref 0–40)
Albumin/Globulin Ratio: 1.7 (ref 1.2–2.2)
Albumin: 4.6 g/dL (ref 3.9–4.9)
Alkaline Phosphatase: 50 IU/L (ref 44–121)
BUN/Creatinine Ratio: 12 (ref 10–24)
BUN: 19 mg/dL (ref 8–27)
Bilirubin Total: 1 mg/dL (ref 0.0–1.2)
CO2: 27 mmol/L (ref 20–29)
Calcium: 9.8 mg/dL (ref 8.6–10.2)
Chloride: 96 mmol/L (ref 96–106)
Creatinine, Ser: 1.56 mg/dL — ABNORMAL HIGH (ref 0.76–1.27)
Globulin, Total: 2.7 g/dL (ref 1.5–4.5)
Glucose: 103 mg/dL — ABNORMAL HIGH (ref 70–99)
Potassium: 4.3 mmol/L (ref 3.5–5.2)
Sodium: 137 mmol/L (ref 134–144)
Total Protein: 7.3 g/dL (ref 6.0–8.5)
eGFR: 50 mL/min/{1.73_m2} — ABNORMAL LOW (ref >59)

## 2022-01-01 LAB — TESTOSTERONE,FREE AND TOTAL
Testosterone, Free: 3.7 pg/mL — ABNORMAL LOW (ref 6.6–18.1)
Testosterone: 547 ng/dL (ref 264–916)

## 2022-01-01 LAB — MAGNESIUM: Magnesium: 1.9 mg/dL (ref 1.6–2.3)

## 2022-01-02 ENCOUNTER — Ambulatory Visit
Admission: RE | Admit: 2022-01-02 | Discharge: 2022-01-02 | Disposition: A | Discharge status: Home / Self Care | Payer: No Typology Code available for payment source | Source: Ambulatory Visit | Attending: Physician Assistant | Admitting: Physician Assistant

## 2022-01-02 DIAGNOSIS — R7401 Elevation of levels of liver transaminase levels: Secondary | ICD-10-CM | POA: Insufficient documentation

## 2022-01-03 ENCOUNTER — Other Ambulatory Visit: Payer: Self-pay

## 2022-01-03 DIAGNOSIS — R748 Abnormal levels of other serum enzymes: Secondary | ICD-10-CM

## 2022-01-12 ENCOUNTER — Ambulatory Visit: Payer: No Typology Code available for payment source | Admitting: Podiatry

## 2022-02-17 LAB — HEMOGLOBIN A1C: Hemoglobin A1C: 6

## 2022-02-17 LAB — HM DIABETES FOOT EXAM: HM Diabetic Foot Exam: ABNORMAL

## 2022-03-08 ENCOUNTER — Ambulatory Visit (INDEPENDENT_AMBULATORY_CARE_PROVIDER_SITE_OTHER): Payer: No Typology Code available for payment source | Admitting: Family Medicine

## 2022-03-08 ENCOUNTER — Encounter: Payer: Self-pay | Admitting: Family Medicine

## 2022-03-08 VITALS — BP 141/81 | HR 70 | Temp 98.2°F | Resp 16 | Wt 266.5 lb

## 2022-03-08 DIAGNOSIS — R0600 Dyspnea, unspecified: Secondary | ICD-10-CM | POA: Diagnosis not present

## 2022-03-08 DIAGNOSIS — R55 Syncope and collapse: Secondary | ICD-10-CM

## 2022-03-08 DIAGNOSIS — G629 Polyneuropathy, unspecified: Secondary | ICD-10-CM | POA: Diagnosis not present

## 2022-03-08 DIAGNOSIS — G4726 Circadian rhythm sleep disorder, shift work type: Secondary | ICD-10-CM | POA: Diagnosis not present

## 2022-03-08 DIAGNOSIS — N529 Male erectile dysfunction, unspecified: Secondary | ICD-10-CM

## 2022-03-08 MED ORDER — ARMODAFINIL 150 MG PO TABS: 150.0000 mg | ORAL_TABLET | Freq: Every day | ORAL | 1 refills | Status: DC

## 2022-03-08 NOTE — Progress Notes (Signed)
I,Trapper Meech S Jonothan Heberle,acting as a scribe for Lelon Huh, MD.,have documented all relevant documentation on the behalf of Lelon Huh, MD,as directed by  Lelon Huh, MD while in the presence of Lelon Huh, MD.     Established patient visit   Patient: Howard Young   DOB: Jun 04, 1960   62 y.o. Male  MRN: [id number] Visit Date: 03/08/2022  Today's healthcare provider: Lelon Huh, MD   Chief Complaint  Patient presents with   Dizziness   Subjective    Dizziness This is a chronic problem. The current episode started more than 1 year ago. The problem occurs intermittently. The problem has been waxing and waning. Pertinent negatives include no diaphoresis, headaches, numbness, visual change, vomiting or weakness. Associated symptoms comments: Off balance. The symptoms are aggravated by standing. He has tried rest for the symptoms.  Patient reports symptoms usually start around 9 pm and end at 2 am. They are precipitated by standing up abruptly. No spinning sensation. He works night shifts. He has prescription for tamsulosin which he started taking about a week ago. He also had prescription for Cialis, but rarely takes that, last dose was over a month ago.   He reports dizziness was really bad 2 nights ago and fell down at work. States that episode was associated with racing heart and dyspnea. He has CGM and BS was not low at time, and has not been having hypoglycemia episodes.   Patient C/O shortness of breath 2 nights ago from walking a short distance.   Medications: Outpatient Medications Prior to Visit  Medication Sig   Armodafinil 150 MG tablet TAKE 1 TABLET BY MOUTH DAILY   B-D UF III MINI PEN NEEDLES 31G X 5 MM MISC SMARTSIG:1 Each SUB-Q Twice Daily   Berberine-RedIsoAlphaAcd-D-K (OSTERA) TABS Take by mouth.   Continuous Blood Gluc Transmit (DEXCOM G6 TRANSMITTER) MISC USE TO MONITOR BLOOD SUGAR. REPLACE EVERY 3 MONTHS   gabapentin (NEURONTIN) 100 MG capsule Take by  mouth.   Insulin Pen Needle (B-D UF III MINI PEN NEEDLES) 31G X 5 MM MISC See admin instructions.   lisinopril-hydrochlorothiazide (ZESTORETIC) 20-12.5 MG tablet Take 2 tablets by mouth daily.   Magnesium 200 MG CHEW Chew 400 mg by mouth daily.   metFORMIN (GLUCOPHAGE) 500 MG tablet Take 1,000 mg by mouth 2 (two) times daily.   metFORMIN (GLUCOPHAGE-XR) 500 MG 24 hr tablet Take 1,000 mg by mouth 2 (two) times daily.   Multiple Vitamin (MULTIVITAMIN WITH MINERALS) TABS tablet Take 1 tablet by mouth daily.   OZEMPIC, 0.25 OR 0.5 MG/DOSE, 2 MG/1.5ML SOPN Inject into the skin.   tadalafil (CIALIS) 10 MG tablet Take 1 tablet (10 mg total) by mouth every other day as needed for erectile dysfunction.   tamsulosin (FLOMAX) 0.4 MG CAPS capsule TAKE 1 CAPSULE BY MOUTH  DAILY   TOUJEO SOLOSTAR 300 UNIT/ML Solostar Pen Inject 70 Units into the skin at bedtime.   No facility-administered medications prior to visit.    Review of Systems  Constitutional:  Negative for diaphoresis.  Respiratory:  Positive for shortness of breath.   Gastrointestinal:  Negative for vomiting.  Neurological:  Positive for dizziness. Negative for weakness, light-headedness, numbness and headaches.      Objective    There were no vitals taken for this visit. BP Readings from Last 3 Encounters:  12/27/21 (!) 140/80  02/08/21 130/82  12/30/20 (!) 144/82   Wt Readings from Last 3 Encounters:  12/27/21 276 lb (125.2 kg)  02/08/21 (!) 303  lb 1.6 oz (137.5 kg)  04/22/20 (!) 318 lb 6.4 oz (144.4 kg)    Physical Exam   General: Appearance:    Mildly obese male in no acute distress  Eyes:    PERRL, conjunctiva/corneas clear, EOM's intact       Lungs:     Clear to auscultation bilaterally, respirations unlabored  Heart:    Normal heart rate. Normal rhythm. No murmurs, rubs, or gallops.    MS:   All extremities are intact.    Neurologic:   Awake, alert, oriented x 3. No apparent focal neurological defect.       EKG:  NSR  Assessment & Plan     1. Syncope, unspecified syncope type  - CBC - Comprehensive metabolic panel - Magnesium He does have history of PAT.  - LONG TERM MONITOR (3-14 DAYS); Future (ZIO)  2. Neuropathy Worsening, he requests further evaluation.  - Ambulatory referral to Neurology  3. Dyspnea, unspecified type  - Brain natriuretic peptide  4. Circadian rhythm sleep disorder, shift work type Refill  Armodafinil 150 MG tablet; Take 1 tablet (150 mg total) by mouth daily.  Dispense: 90 tablet; Refill: 1      The entirety of the information documented in the History of Present Illness, Review of Systems and Physical Exam were personally obtained by me. Portions of this information were initially documented by the CMA and reviewed by me for thoroughness and accuracy.     Lelon Huh, MD  [place] [phone] (phone) [phone] (fax)  [place]

## 2022-03-10 LAB — COMPREHENSIVE METABOLIC PANEL
ALT: 42 IU/L (ref 0–44)
AST: 47 IU/L — ABNORMAL HIGH (ref 0–40)
Albumin/Globulin Ratio: 1.5 (ref 1.2–2.2)
Albumin: 4.5 g/dL (ref 3.9–4.9)
Alkaline Phosphatase: 53 IU/L (ref 44–121)
BUN/Creatinine Ratio: 12 (ref 10–24)
BUN: 23 mg/dL (ref 8–27)
Bilirubin Total: 1.3 mg/dL — ABNORMAL HIGH (ref 0.0–1.2)
CO2: 24 mmol/L (ref 20–29)
Calcium: 10.3 mg/dL — ABNORMAL HIGH (ref 8.6–10.2)
Chloride: 92 mmol/L — ABNORMAL LOW (ref 96–106)
Creatinine, Ser: 1.86 mg/dL — ABNORMAL HIGH (ref 0.76–1.27)
Globulin, Total: 3 g/dL (ref 1.5–4.5)
Glucose: 108 mg/dL — ABNORMAL HIGH (ref 70–99)
Potassium: 5 mmol/L (ref 3.5–5.2)
Sodium: 134 mmol/L (ref 134–144)
Total Protein: 7.5 g/dL (ref 6.0–8.5)
eGFR: 40 mL/min/{1.73_m2} — ABNORMAL LOW (ref >59)

## 2022-03-10 LAB — CBC
Hematocrit: 48.5 % (ref 37.5–51.0)
Hemoglobin: 16.5 g/dL (ref 13.0–17.7)
MCH: 28.6 pg (ref 26.6–33.0)
MCHC: 34 g/dL (ref 31.5–35.7)
MCV: 84 fL (ref 79–97)
Platelets: 289 10*3/uL (ref 150–450)
RBC: 5.77 x10E6/uL (ref 4.14–5.80)
RDW: 12.5 % (ref 11.6–15.4)
WBC: 7.5 10*3/uL (ref 3.4–10.8)

## 2022-03-10 LAB — BRAIN NATRIURETIC PEPTIDE: BNP: 16.9 pg/mL (ref 0.0–100.0)

## 2022-03-10 LAB — MAGNESIUM: Magnesium: 2.1 mg/dL (ref 1.6–2.3)

## 2022-03-12 ENCOUNTER — Other Ambulatory Visit: Payer: Self-pay | Admitting: Family Medicine

## 2022-03-12 DIAGNOSIS — N1832 Chronic kidney disease, stage 3b: Secondary | ICD-10-CM

## 2022-03-12 DIAGNOSIS — E0840 Diabetes mellitus due to underlying condition with diabetic neuropathy, unspecified: Secondary | ICD-10-CM

## 2022-03-13 ENCOUNTER — Ambulatory Visit: Payer: No Typology Code available for payment source | Attending: Family Medicine

## 2022-03-13 DIAGNOSIS — R55 Syncope and collapse: Secondary | ICD-10-CM

## 2022-03-15 DIAGNOSIS — R55 Syncope and collapse: Secondary | ICD-10-CM | POA: Diagnosis not present

## 2022-04-03 NOTE — Progress Notes (Unsigned)
Ar[place] Ponder De[place],acting as a scribe for Lelon Huh, MD.,have documented all relevant documentation on the behalf of Lelon Huh, MD,as directed by  Lelon Huh, MD while in the presence of Lelon Huh, MD.    Complete physical exam   Patient: Howard Young   DOB: 06/14/1960   62 y.o. Male  MRN: [id number] Visit Date: 04/04/2022  Today's healthcare provider: Lelon Huh, MD   No chief complaint on file.  Subjective    Howard Young is a 62 y.o. male who presents today for a complete physical exam.  He states he had onset of sore throat, cough, nasal drainage, and severe fatigue about three days. The cough and sore throat are improvement but he extremely fatigue.   He continues to follow up with endocrinology for diabetes management. He was found to have significant rise in creatinine last month and has appt to establish with neurology in march.  He was also seen in January for near syncopal episodes. He wore a Zio monitor for a week but had no episodes while wearing it. He has had a few episodes since finishing the Zio.   Past Medical History:  Diagnosis Date   Barrett esophagus    Diabetes mellitus without complication (HCC)    GERD (gastroesophageal reflux disease)    Hypertension    Morbid obesity ([place])    Pneumonia    Sleep apnea    Past Surgical History:  Procedure Laterality Date   BREATH TEK H PYLORI  04/25/2011   Procedure: BREATH TEK H PYLORI;  Surgeon: Pedro Earls, MD;  Location: [facility];  Service: General;  Laterality: N/A;   CATARACT EXTRACTION, BILATERAL  2002   CHOLECYSTECTOMY  2001   COLONOSCOPY WITH PROPOFOL N/A 03/08/2020   Procedure: COLONOSCOPY WITH PROPOFOL;  Surgeon: Lin Landsman, MD;  Location: [facility];  Service: Gastroenterology;  Laterality: N/A;   ESOPHAGOGASTRODUODENOSCOPY (EGD) WITH PROPOFOL  03/08/2020   Procedure: ESOPHAGOGASTRODUODENOSCOPY (EGD) WITH PROPOFOL;  Surgeon: Lin Landsman, MD;  Location:  [facility];  Service: Gastroenterology;;   heart ablation  [place]  2012   right hip   LAPAROSCOPIC GASTRIC BANDING  2008   MASS EXCISION N/A 04/14/2020   Procedure: EXCISION MASS (EXCISION OF ABDOMINAL WALL MASS- AV MALFORMATION );  Surgeon: Katha Cabal, MD;  Location: [facility];  Service: Vascular;  Laterality: N/A;   SCROTAL EXPLORATION N/A 11/09/2016   Procedure: SCROTUM EXPLORATION;  Surgeon: Nickie Retort, MD;  Location: [facility];  Service: Urology;  Laterality: N/A;   Social History   Socioeconomic History   Marital status: Married    Spouse name: Sheri   Number of children: Not on file   Years of education: Not on file   Highest education level: Not on file  Occupational History   Not on file  Tobacco Use   Smoking status: Never   Smokeless tobacco: Never  Vaping Use   Vaping Use: Never used  Substance and Sexual Activity   Alcohol use: No   Drug use: No   Sexual activity: Not on file  Other Topics Concern   Not on file  Social History Narrative   Not on file   Social Determinants of Health   Financial Resource Strain: Not on file  Food Insecurity: Not on file  Transportation Needs: Not on file  Physical Activity: Not on file  Stress: Not on file  Social Connections: Not on file  Intimate Partner Violence: Not on file  Family Status  Relation Name Status   Mother  Deceased at age 34   Father  Deceased at age 33       suicide   Sister  Alive   Sister  Alive   Sister  Alive   Family History  Problem Relation Age of Onset   Cancer Mother        breast, lung   Diabetes Mother    Heart failure Mother    Cancer Father        colon   Cancer Sister        breast   Cancer Sister        ovarian   Allergies  Allergen Reactions   Metoclopramide Hcl Shortness Of Breath and Anxiety   Glipizide     Profound weigh gain    Patient Care Team: Birdie Sons, MD as PCP - General (Family Medicine) Lonia Farber, MD  as Consulting Physician (Internal Medicine) Lin Landsman, MD as Consulting Physician (Gastroenterology) Pa, [place])   Medications: Outpatient Medications Prior to Visit  Medication Sig   Armodafinil 150 MG tablet Take 1 tablet (150 mg total) by mouth daily.   B-D UF III MINI PEN NEEDLES 31G X 5 MM MISC SMARTSIG:1 Each SUB-Q Twice Daily   Berberine-RedIsoAlphaAcd-D-K (OSTERA) TABS Take by mouth.   Continuous Blood Gluc Transmit (DEXCOM G6 TRANSMITTER) MISC USE TO MONITOR BLOOD SUGAR. REPLACE EVERY 3 MONTHS   gabapentin (NEURONTIN) 100 MG capsule Take by mouth.   Insulin Pen Needle (B-D UF III MINI PEN NEEDLES) 31G X 5 MM MISC See admin instructions.   lisinopril-hydrochlorothiazide (ZESTORETIC) 20-12.5 MG tablet Take 2 tablets by mouth daily.   Magnesium 200 MG CHEW Chew 400 mg by mouth daily.   metFORMIN (GLUCOPHAGE) 500 MG tablet Take 1,000 mg by mouth 2 (two) times daily.   metFORMIN (GLUCOPHAGE-XR) 500 MG 24 hr tablet Take 1,000 mg by mouth 2 (two) times daily.   Multiple Vitamin (MULTIVITAMIN WITH MINERALS) TABS tablet Take 1 tablet by mouth daily.   OZEMPIC, 0.25 OR 0.5 MG/DOSE, 2 MG/1.5ML SOPN Inject into the skin.   tadalafil (CIALIS) 10 MG tablet Take 1 tablet (10 mg total) by mouth every other day as needed for erectile dysfunction.   tamsulosin (FLOMAX) 0.4 MG CAPS capsule TAKE 1 CAPSULE BY MOUTH  DAILY   TOUJEO SOLOSTAR 300 UNIT/ML Solostar Pen Inject 70 Units into the skin at bedtime.   No facility-administered medications prior to visit.    Review of Systems  Constitutional: Negative.   HENT: Negative.    Eyes: Negative.   Respiratory: Negative.    Cardiovascular: Negative.   Gastrointestinal: Negative.   Endocrine: Negative.   Genitourinary: Negative.   Musculoskeletal: Negative.   Skin: Negative.   Allergic/Immunologic: Negative.   Neurological: Negative.   Hematological: Negative.   Psychiatric/Behavioral: Negative.       Last CBC Lab Results  Component Value Date   WBC 7.5 03/09/2022   HGB 16.5 03/09/2022   HCT 48.5 03/09/2022   MCV 84 03/09/2022   MCH 28.6 03/09/2022   RDW 12.5 03/09/2022   PLT 289 123456   Last metabolic panel Lab Results  Component Value Date   GLUCOSE 108 (H) 03/09/2022   NA 134 03/09/2022   K 5.0 03/09/2022   CL 92 (L) 03/09/2022   CO2 24 03/09/2022   BUN 23 03/09/2022   CREATININE 1.86 (H) 03/09/2022   EGFR 40 (L) 03/09/2022   CALCIUM 10.3 (  H) 03/09/2022   PHOS 3.0 09/17/2014   PROT 7.5 03/09/2022   ALBUMIN 4.5 03/09/2022   LABGLOB 3.0 03/09/2022   AGRATIO 1.5 03/09/2022   BILITOT 1.3 (H) 03/09/2022   ALKPHOS 53 03/09/2022   AST 47 (H) 03/09/2022   ALT 42 03/09/2022   ANIONGAP 8 04/06/2020   Last lipids Lab Results  Component Value Date   CHOL 158 07/25/2021   HDL 43 07/25/2021   LDLCALC 85 07/25/2021   TRIG 149 07/25/2021   CHOLHDL 2.8 02/04/2018   Last hemoglobin A1c Lab Results  Component Value Date   HGBA1C 6.0 02/17/2022   Last thyroid functions Lab Results  Component Value Date   TSH 2.88 07/25/2021   Lab Results  Component Value Date   PSA1 1.1 12/29/2021   PSA1 0.9 02/08/2021     Objective    BP (!) 145/107 (BP Location: Right Arm, Patient Position: Sitting, Cuff Size: Large)   Pulse 88   Temp 98 F (36.7 C)   Ht 6' 4"$  (1.93 m)   Wt 261 lb (118.4 kg)   SpO2 96%   BMI 31.77 kg/m    Physical Exam  . General Appearance:    Obese male. Alert, cooperative, in no acute distress, appears stated age  Head:    Normocephalic, without obvious abnormality, atraumatic  Eyes:    PERRL, conjunctiva/corneas clear, EOM's intact, fundi    benign, both eyes       Ears:    Normal TM's and external ear canals, both ears  Nose:   Nares normal, septum midline, mucosa normal, no drainage   or sinus tenderness  Throat:   Lips, mucosa, and tongue normal; teeth and gums normal  Neck:   Supple, symmetrical, trachea midline, no adenopathy;        thyroid:  No enlargement/tenderness/nodules; no carotid   bruit or JVD  Back:     Symmetric, no curvature, ROM normal, no CVA tenderness  Lungs:     Clear to auscultation bilaterally, respirations unlabored  Chest wall:    No tenderness or deformity  Heart:    Normal heart rate. Normal rhythm. No murmurs, rubs, or gallops.  S1 and S2 normal  Abdomen:     Soft, non-tender, bowel sounds active all four quadrants,    no masses, no organomegaly  Genitalia:    deferred  Rectal:    deferred  Extremities:   All extremities are intact. No cyanosis or edema  Pulses:   2+ and symmetric all extremities  Skin:   Skin color, texture, turgor normal, no rashes or lesions  Lymph nodes:   Cervical, supraclavicular, and axillary nodes normal  Neurologic:   CNII-XII intact. Normal strength, sensation and reflexes      throughout   Results for orders placed or performed in visit on 04/04/22  POCT Influenza A/B  Result Value Ref Range   Influenza A, POC Negative Negative   Influenza B, POC Negative Negative     Last depression screening scores    12/27/2021    9:10 AM 02/08/2021    8:46 AM 01/21/2020   11:35 AM  PHQ 2/9 Scores  PHQ - 2 Score 0 0 0  PHQ- 9 Score 0  0   Last fall risk screening    03/08/2022   11:24 AM  Fall Risk   Falls in the past year? 1  Number falls in past yr: 0  Injury with Fall? 0  Risk for fall due to : History of fall(s)  Follow up Falls evaluation completed;Education provided;Falls prevention discussed   Last Audit-C alcohol use screening    12/27/2021    9:10 AM  Alcohol Use Disorder Test (AUDIT)  1. How often do you have a drink containing alcohol? 0  2. How many drinks containing alcohol do you have on a typical day when you are drinking? 0  3. How often do you have six or more drinks on one occasion? 0  AUDIT-C Score 0   A score of 3 or more in women, and 4 or more in men indicates increased risk for alcohol abuse, EXCEPT if all of the points are  from question 1   No results found for any visits on 04/04/22.  Assessment & Plan    Routine Health Maintenance and Physical Exam  Exercise Activities and Dietary recommendations  Goals   None     Immunization History  Administered Date(s) Administered   Influenza Inj Mdck Quad Pf 12/01/2017   Influenza,inj,Quad PF,6+ Mos 11/28/2012, 02/23/2014, 11/22/2015, 10/23/2016, 01/07/2020, 12/12/2021   Influenza-Unspecified 11/14/2014, 11/09/2016   PFIZER(Purple Top)SARS-COV-2 Vaccination 04/20/2019, 05/13/2019, 01/21/2020   Pneumococcal Polysaccharide-23 11/28/2012   Tdap 02/08/2021   Zoster Recombinat (Shingrix) 02/04/2018, 04/08/2018    Health Maintenance  Topic Date Due   OPHTHALMOLOGY EXAM  02/28/2019   Diabetic kidney evaluation - Urine ACR  05/17/2021   COVID-19 Vaccine (4 - 2023-24 season) 10/14/2021   HEMOGLOBIN A1C  08/18/2022   FOOT EXAM  02/18/2023   COLONOSCOPY (Pts 45-3yr Insurance coverage will need to be confirmed)  03/09/2023   Diabetic kidney evaluation - eGFR measurement  03/10/2023   DTaP/Tdap/Td (2 - Td or Tdap) 02/09/2031   INFLUENZA VACCINE  Completed   Hepatitis C Screening  Completed   HIV Screening  Completed   Zoster Vaccines- Shingrix  Completed   HPV VACCINES  Aged Out    Discussed health benefits of physical activity, and encouraged him to engage in regular exercise appropriate for his age and condition.   2. Chronic kidney disease, stage 3b ([place] Scheduled to see Dr. SCandiss Norsein march.   3. Essential hypertension Uncontrolled today, likely affected by acute illness.   4. WPW (Wolff-Parkinson-White syndrome)-cardiac ablation 1993   5. Syncope, unspecified syncope type Zio report pending, although he states he had no episodes while he was wearing it.   6. NASH (nonalcoholic steatohepatitis) On Ozempic.   7. Diabetes mellitus due to underlying condition with diabetic neuropathy, unspecified whether long term insulin use ([place] Continue  routine follow up Dr. OHonor Junes   8. Acute cough   9. Other fatigue   10. Sore throat Covid test collected and sent for PCR         DLelon Huh MD  [phone](phone) [phone](fax)  [place]

## 2022-04-04 ENCOUNTER — Encounter: Payer: Self-pay | Admitting: Family Medicine

## 2022-04-04 ENCOUNTER — Ambulatory Visit (INDEPENDENT_AMBULATORY_CARE_PROVIDER_SITE_OTHER): Payer: No Typology Code available for payment source | Admitting: Family Medicine

## 2022-04-04 VITALS — BP 145/107 | HR 88 | Temp 98.0°F | Ht 76.0 in | Wt 261.0 lb

## 2022-04-04 DIAGNOSIS — K7581 Nonalcoholic steatohepatitis (NASH): Secondary | ICD-10-CM

## 2022-04-04 DIAGNOSIS — I456 Pre-excitation syndrome: Secondary | ICD-10-CM

## 2022-04-04 DIAGNOSIS — E0840 Diabetes mellitus due to underlying condition with diabetic neuropathy, unspecified: Secondary | ICD-10-CM

## 2022-04-04 DIAGNOSIS — R55 Syncope and collapse: Secondary | ICD-10-CM

## 2022-04-04 DIAGNOSIS — R0981 Nasal congestion: Secondary | ICD-10-CM

## 2022-04-04 DIAGNOSIS — N1832 Chronic kidney disease, stage 3b: Secondary | ICD-10-CM

## 2022-04-04 DIAGNOSIS — Z0000 Encounter for general adult medical examination without abnormal findings: Secondary | ICD-10-CM

## 2022-04-04 DIAGNOSIS — R5383 Other fatigue: Secondary | ICD-10-CM

## 2022-04-04 DIAGNOSIS — J029 Acute pharyngitis, unspecified: Secondary | ICD-10-CM | POA: Diagnosis not present

## 2022-04-04 DIAGNOSIS — R051 Acute cough: Secondary | ICD-10-CM | POA: Diagnosis not present

## 2022-04-04 DIAGNOSIS — I10 Essential (primary) hypertension: Secondary | ICD-10-CM

## 2022-04-04 LAB — POCT INFLUENZA A/B
Influenza A, POC: NEGATIVE
Influenza B, POC: NEGATIVE

## 2022-04-04 NOTE — Addendum Note (Signed)
Addended by: Elta Guadeloupe on: 04/04/2022 01:53 PM   Modules accepted: Orders

## 2022-04-05 LAB — NOVEL CORONAVIRUS, NAA: SARS-CoV-2, NAA: DETECTED — AB

## 2022-05-03 ENCOUNTER — Other Ambulatory Visit: Payer: Self-pay | Admitting: Nephrology

## 2022-05-03 DIAGNOSIS — E1122 Type 2 diabetes mellitus with diabetic chronic kidney disease: Secondary | ICD-10-CM

## 2022-05-03 DIAGNOSIS — N1831 Chronic kidney disease, stage 3a: Secondary | ICD-10-CM

## 2022-05-03 DIAGNOSIS — R809 Proteinuria, unspecified: Secondary | ICD-10-CM

## 2022-05-09 ENCOUNTER — Encounter: Payer: Self-pay | Admitting: Family Medicine

## 2022-05-10 ENCOUNTER — Other Ambulatory Visit: Payer: Self-pay | Admitting: Family Medicine

## 2022-05-10 DIAGNOSIS — R55 Syncope and collapse: Secondary | ICD-10-CM

## 2022-05-12 ENCOUNTER — Ambulatory Visit: Payer: No Typology Code available for payment source

## 2022-05-30 DIAGNOSIS — N1831 Chronic kidney disease, stage 3a: Secondary | ICD-10-CM | POA: Insufficient documentation

## 2022-05-30 DIAGNOSIS — Q6102 Congenital multiple renal cysts: Secondary | ICD-10-CM | POA: Insufficient documentation

## 2022-05-30 DIAGNOSIS — N1832 Chronic kidney disease, stage 3b: Secondary | ICD-10-CM | POA: Insufficient documentation

## 2022-05-30 DIAGNOSIS — R809 Proteinuria, unspecified: Secondary | ICD-10-CM | POA: Insufficient documentation

## 2022-06-01 ENCOUNTER — Ambulatory Visit: Payer: No Typology Code available for payment source | Attending: Cardiology | Admitting: Cardiology

## 2022-06-02 ENCOUNTER — Encounter: Payer: Self-pay | Admitting: Cardiology

## 2022-06-16 ENCOUNTER — Encounter: Payer: Self-pay | Admitting: *Deleted

## 2022-07-05 ENCOUNTER — Ambulatory Visit: Payer: No Typology Code available for payment source | Admitting: Podiatry

## 2022-07-27 ENCOUNTER — Ambulatory Visit (INDEPENDENT_AMBULATORY_CARE_PROVIDER_SITE_OTHER): Payer: No Typology Code available for payment source | Admitting: Podiatry

## 2022-07-27 ENCOUNTER — Encounter: Payer: Self-pay | Admitting: Podiatry

## 2022-07-27 VITALS — BP 147/72 | HR 66

## 2022-07-27 DIAGNOSIS — M79675 Pain in left toe(s): Secondary | ICD-10-CM | POA: Diagnosis not present

## 2022-07-27 DIAGNOSIS — B351 Tinea unguium: Secondary | ICD-10-CM | POA: Diagnosis not present

## 2022-07-27 DIAGNOSIS — M79674 Pain in right toe(s): Secondary | ICD-10-CM

## 2022-07-27 DIAGNOSIS — E0840 Diabetes mellitus due to underlying condition with diabetic neuropathy, unspecified: Secondary | ICD-10-CM

## 2022-07-27 NOTE — Progress Notes (Signed)
  Subjective:  Patient ID: LUCIA SCHROETER, male    DOB: [birth year],  MRN: [id number]  Eugenie Filler presents to clinic today for painful elongated mycotic toenails 1-5 bilaterally which are tender when wearing enclosed shoe gear. Pain is relieved with periodic professional debridement. Chief Complaint  Patient presents with   Nail Problem    "Check my feet and trim my toenails." Dr. Verdis Frederickson - February 2024, glucose - haven't checked, phone broken    New problem(s): None.   PCP is Malva Limes, MD.  Allergies  Allergen Reactions   Metoclopramide Hcl Shortness Of Breath and Anxiety   Glipizide     Profound weigh gain    Review of Systems: Negative except as noted in the HPI. Objective:   Vitals:   07/27/22 0902  BP: (!) 147/72  Pulse: 66   Constitutional JERRIL REANEY is a pleasant 62 y.o. male, morbidly obese in NAD. AAO x 3.   Vascular Vascular Examination: Capillary refill time immediate b/l. Vascular status intact b/l with palpable pedal pulses. Pedal hair absent b/l. No pain with calf compression b/l. Skin temperature gradient WNL b/l. No cyanosis or clubbing b/l. +1 pitting edema noted BLE.  Neurological Examination: Sensation grossly intact b/l with 10 gram monofilament. Vibratory sensation intact b/l. Pt has subjective symptoms of neuropathy.   Dermatological Examination: Pedal skin with normal turgor, texture and tone b/l.  No open wounds. No interdigital macerations.   Toenails 1-5 b/l thick, discolored, elongated with subungual debris and pain on dorsal palpation.   No hyperkeratotic nor porokeratotic lesions present on today's visit.  Musculoskeletal Examination: Normal muscle strength 5/5 to all lower extremity muscle groups bilaterally. No pain, crepitus or joint limitation noted with ROM b/l LE. No gross bony pedal deformities b/l. Patient ambulates independently without assistive aids.  Radiographs: None  Last A1c:      02/17/2022    12:00 AM  Hemoglobin A1C  Hemoglobin-A1c 6.0         This result is from an external source.     Assessment:   1. Pain due to onychomycosis of toenails of both feet   2. Diabetes mellitus due to underlying condition with diabetic neuropathy, unspecified whether long term insulin use (HCC)    Plan:  Consent given for treatment as described below: Patient was evaluated and treated. All patient's and/or POA's questions/concerns addressed on today's visit. Toenails 1-5 debrided in length and girth without incident. Continue soft, supportive shoe gear daily. Report any pedal injuries to medical professional. Call office if there are any questions/concerns. -Patient/POA to call should there be question/concern in the interim.  Return in about 4 months (around 11/26/2022).  Freddie Breech, DPM

## 2022-08-24 ENCOUNTER — Other Ambulatory Visit: Payer: Self-pay | Admitting: Family Medicine

## 2022-08-24 DIAGNOSIS — G4726 Circadian rhythm sleep disorder, shift work type: Secondary | ICD-10-CM

## 2022-08-28 ENCOUNTER — Telehealth: Payer: Self-pay | Admitting: Family Medicine

## 2022-08-28 DIAGNOSIS — G4726 Circadian rhythm sleep disorder, shift work type: Secondary | ICD-10-CM

## 2022-08-28 NOTE — Telephone Encounter (Signed)
Please contact pharmacy to see if there is a problem with there e-prescription. Have sent this prescription twice and it didn't go through either time.  Also, patient is overdue for office visit and needs to schedule before any additional refills can be sent.

## 2022-08-30 MED ORDER — ARMODAFINIL 150 MG PO TABS: 150.0000 mg | ORAL_TABLET | Freq: Every day | ORAL | 5 refills | Status: DC

## 2022-08-30 NOTE — Addendum Note (Signed)
Addended by: Malva Limes on: 08/30/2022 07:45 AM   Modules accepted: Orders

## 2022-11-27 ENCOUNTER — Encounter: Payer: Self-pay | Admitting: Podiatry

## 2022-11-27 ENCOUNTER — Ambulatory Visit (INDEPENDENT_AMBULATORY_CARE_PROVIDER_SITE_OTHER): Payer: No Typology Code available for payment source | Admitting: Podiatry

## 2022-11-27 DIAGNOSIS — M79675 Pain in left toe(s): Secondary | ICD-10-CM | POA: Diagnosis not present

## 2022-11-27 DIAGNOSIS — E0840 Diabetes mellitus due to underlying condition with diabetic neuropathy, unspecified: Secondary | ICD-10-CM | POA: Diagnosis not present

## 2022-11-27 DIAGNOSIS — B351 Tinea unguium: Secondary | ICD-10-CM | POA: Diagnosis not present

## 2022-11-27 DIAGNOSIS — M79674 Pain in right toe(s): Secondary | ICD-10-CM

## 2022-11-27 NOTE — Progress Notes (Signed)
Subjective:  Patient ID: Howard Young, male    DOB: 02-04-1961,  MRN: [id number]  61 y.o. male presents to clinic with  preventative diabetic foot care and painful thick toenails that are difficult to trim. Pain interferes with ambulation. Aggravating factors include wearing enclosed shoe gear. Pain is relieved with periodic professional debridement.  Chief Complaint  Patient presents with   Diabetes    BS-132 A1C-6.1 PCPV-10/2022     New problem(s): None   PCP is Fisher, Demetrios Isaacs, MD.  Allergies  Allergen Reactions   Metoclopramide Hcl Shortness Of Breath and Anxiety   Glipizide     Profound weigh gain    Review of Systems: Negative except as noted in the HPI.   Objective:  Howard Young is a pleasant 62 y.o. male obese in NAD.Marland Kitchen  Vascular Examination: Vascular status intact b/l with palpable pedal pulses. CFT immediate b/l.  No pain with calf compression b/l. Skin temperature gradient WNL b/l. No edema noted b/l LE.  Neurological Examination: Sensation grossly intact b/l with 10 gram monofilament. Vibratory sensation intact b/l. Pt has subjective symptoms of neuropathy.  Dermatological Examination: Pedal skin with normal turgor, texture and tone b/l. Toenails 1-5 b/l thick, discolored, elongated with subungual debris and pain on dorsal palpation. No hyperkeratotic lesions noted b/l. No corns, calluses nor porokeratotic lesions noted.  Musculoskeletal Examination: Muscle strength 5/5 to b/l LE. No pain, crepitus or joint limitation noted with ROM bilateral LE. No gross bony deformities bilaterally.  Radiographs: None  Last A1c:      02/17/2022   12:00 AM  Hemoglobin A1C  Hemoglobin-A1c 6.0         This result is from an external source.   Assessment:   1. Pain due to onychomycosis of toenails of both feet   2. Diabetes mellitus due to underlying condition with diabetic neuropathy, unspecified whether long term insulin use (HCC)    Plan:  -Patient was  evaluated and treated. All patient's and/or POA's questions/concerns answered on today's visit. -Consent given for treatment as described below: -Examined patient. -Continue supportive shoe gear daily. -Mycotic toenails 1-5 bilaterally were debrided in length and girth with sterile nail nippers and dremel without incident. -Patient/POA to call should there be question/concern in the interim.  Return in about 3 months (around 02/27/2023).  Freddie Breech, DPM

## 2023-01-14 ENCOUNTER — Ambulatory Visit
Admission: EM | Admit: 2023-01-14 | Discharge: 2023-01-14 | Disposition: A | Discharge status: Home / Self Care | Payer: No Typology Code available for payment source | Attending: Physician Assistant | Admitting: Physician Assistant

## 2023-01-14 DIAGNOSIS — R0981 Nasal congestion: Secondary | ICD-10-CM | POA: Diagnosis not present

## 2023-01-14 DIAGNOSIS — H109 Unspecified conjunctivitis: Secondary | ICD-10-CM | POA: Diagnosis not present

## 2023-01-14 DIAGNOSIS — J069 Acute upper respiratory infection, unspecified: Secondary | ICD-10-CM | POA: Diagnosis not present

## 2023-01-14 LAB — POC COVID19/FLU A&B COMBO
Covid Antigen, POC: NEGATIVE
Influenza A Antigen, POC: NEGATIVE
Influenza B Antigen, POC: NEGATIVE

## 2023-01-14 MED ORDER — IPRATROPIUM BROMIDE 0.03 % NA SOLN: 1.0000 | Freq: Two times a day (BID) | NASAL | 0 refills | Status: DC

## 2023-01-14 MED ORDER — BENZONATATE 100 MG PO CAPS: 100.0000 mg | ORAL_CAPSULE | Freq: Three times a day (TID) | ORAL | 0 refills | Status: DC

## 2023-01-14 MED ORDER — POLYMYXIN B-TRIMETHOPRIM 10000-0.1 UNIT/ML-% OP SOLN: 1.0000 [drp] | Freq: Four times a day (QID) | OPHTHALMIC | 0 refills | Status: DC

## 2023-01-14 NOTE — ED Provider Notes (Signed)
Renaldo Fiddler    CSN: [id number] Arrival date & time: 01/14/23  1215      History   Chief Complaint Chief Complaint  Patient presents with   Cough   Nasal Congestion   Eye Drainage    LT eye     HPI GERRETT ARAGONEZ is a 62 y.o. male.   Patient presents today with a 4 to 5-day history of URI symptoms including rhinorrhea, congestion, cough.  He has also developed some redness and drainage from his left eye.  Denies any fever, chest pain, shortness of breath, nausea, vomiting.  Reports multiple household sick contacts that were diagnosed with URI including his grandson who probably brought this home from daycare.  He has had COVID with last episode several years ago.  He has not had COVID-19 vaccine since the initial vaccinations.  Denies any recent antibiotics in the past 90 days.  Does report he was given steroids for a pinched nerve several months ago.  He has been taking Mucinex and over-the-counter medication without improvement of symptoms.  He denies any history of allergies, asthma, COPD, smoking.    Past Medical History:  Diagnosis Date   Barrett esophagus    Diabetes mellitus without complication (HCC)    GERD (gastroesophageal reflux disease)    Hypertension    Morbid obesity (HCC)    Pneumonia    Sleep apnea     Patient Active Problem List   Diagnosis Date Noted   Chronic kidney disease, stage 3a (HCC) 05/30/2022   Multiple renal cysts 05/30/2022   Proteinuria, unspecified 05/30/2022   Urinary frequency 12/27/2021   Low testosterone 12/27/2021   Erectile dysfunction 12/27/2021   Circadian rhythm sleep disorder, shift work type 02/08/2021   Abscess of abdominal wall 04/08/2020   Barrett's esophagus without dysplasia    History of adenomatous polyp of colon 01/07/2020   Loss of memory 04/10/2018   Diabetic retinopathy (HCC) 02/28/2018   Fournier gangrene 11/09/2016   PVC (premature ventricular contraction) 08/16/2015   Anxiety 09/15/2014    Elevated CK 09/15/2014   Essential hypertension 09/15/2014   Hypertriglyceridemia 09/15/2014   Hypotestosteronism 09/15/2014   NASH (nonalcoholic steatohepatitis) 09/15/2014   Diabetes mellitus with diabetic neuropathy (HCC) 09/15/2014   Apnea, sleep 09/15/2014   Male hypogonadism 06/10/2013   Metabolic syndrome 01/04/2012   Obesity-BMI 41 03/31/2011   Status post gastric banding-APL 2008 03/31/2011   WPW (Wolff-Parkinson-White syndrome)-cardiac ablation 1993 03/31/2011   S/P cholecystectomy-2001 03/31/2011    Past Surgical History:  Procedure Laterality Date   BREATH TEK H PYLORI  04/25/2011   Procedure: BREATH TEK H PYLORI;  Surgeon: Valarie Merino, MD;  Location: [facility];  Service: General;  Laterality: N/A;   CATARACT EXTRACTION, BILATERAL  2002   CHOLECYSTECTOMY  2001   COLONOSCOPY WITH PROPOFOL N/A 03/08/2020   Procedure: COLONOSCOPY WITH PROPOFOL;  Surgeon: Toney Reil, MD;  Location: [facility];  Service: Gastroenterology;  Laterality: N/A;   ESOPHAGOGASTRODUODENOSCOPY (EGD) WITH PROPOFOL  03/08/2020   Procedure: ESOPHAGOGASTRODUODENOSCOPY (EGD) WITH PROPOFOL;  Surgeon: Toney Reil, MD;  Location: [facility];  Service: Gastroenterology;;   heart ablation  1993   JOINT REPLACEMENT  2012   right hip   LAPAROSCOPIC GASTRIC BANDING  2008   MASS EXCISION N/A 04/14/2020   Procedure: EXCISION MASS (EXCISION OF ABDOMINAL WALL MASS- AV MALFORMATION );  Surgeon: Renford Dills, MD;  Location: [facility];  Service: Vascular;  Laterality: N/A;   SCROTAL EXPLORATION N/A 11/09/2016   Procedure:  SCROTUM EXPLORATION;  Surgeon: Hildred Laser, MD;  Location: [facility];  Service: Urology;  Laterality: N/A;       Home Medications    Prior to Admission medications   Medication Sig Start Date End Date Taking? Authorizing Provider  benzonatate (TESSALON) 100 MG capsule Take 1 capsule (100 mg total) by mouth every 8 (eight) hours. 01/14/23  Yes Makenzye Troutman K,  PA-C  ipratropium (ATROVENT) 0.03 % nasal spray Place 1 spray into both nostrils every 12 (twelve) hours. 01/14/23  Yes Shanica Castellanos K, PA-C  trimethoprim-polymyxin b (POLYTRIM) ophthalmic solution Place 1 drop into the left eye every 6 (six) hours. 01/14/23  Yes Ticara Waner K, PA-C  Armodafinil 150 MG tablet Take 1 tablet (150 mg total) by mouth daily. 08/30/22   Malva Limes, MD  B-D UF III MINI PEN NEEDLES 31G X 5 MM MISC SMARTSIG:1 Each SUB-Q Twice Daily 08/29/19   [provider]  Berberine-RedIsoAlphaAcd-D-K Timoteo Expose) TABS Take by mouth.    [provider]  Continuous Blood Gluc Transmit (DEXCOM G6 TRANSMITTER) MISC USE TO MONITOR BLOOD SUGAR. REPLACE EVERY 3 MONTHS 12/14/20   [provider]  gabapentin (NEURONTIN) 100 MG capsule Take by mouth. 10/19/20   [provider]  Insulin Pen Needle (B-D UF III MINI PEN NEEDLES) 31G X 5 MM MISC See admin instructions. 07/11/19   [provider]  lisinopril-hydrochlorothiazide (ZESTORETIC) 20-12.5 MG tablet Take 2 tablets by mouth daily. 04/21/19   [provider]  Magnesium 200 MG CHEW Chew 400 mg by mouth daily.    [provider]  metFORMIN (GLUCOPHAGE) 500 MG tablet Take 1,000 mg by mouth 2 (two) times daily. 11/19/20   [provider]  metFORMIN (GLUCOPHAGE-XR) 500 MG 24 hr tablet Take 1,000 mg by mouth 2 (two) times daily.    [provider]  Multiple Vitamin (MULTIVITAMIN WITH MINERALS) TABS tablet Take 1 tablet by mouth daily.    [provider]  OZEMPIC, 0.25 OR 0.5 MG/DOSE, 2 MG/1.5ML SOPN Inject into the skin. 09/06/20   [provider]  tadalafil (CIALIS) 10 MG tablet Take 1 tablet (10 mg total) by mouth every other day as needed for erectile dysfunction. 12/27/21   Alfredia Ferguson, PA-C  tamsulosin (FLOMAX) 0.4 MG CAPS capsule TAKE 1 CAPSULE BY MOUTH  DAILY 10/26/21   Malva Limes, MD  TOUJEO SOLOSTAR 300 UNIT/ML Solostar Pen Inject 70 Units into  the skin at bedtime. 03/20/19   [provider]  insulin aspart (NOVOLOG) 100 UNIT/ML injection Inject 60 Units into the skin at bedtime. Patient taking 30 - 60 units daily   04/25/11  [provider]    Family History Family History  Problem Relation Age of Onset   Cancer Mother        breast, lung   Diabetes Mother    Heart failure Mother    Cancer Father        colon   Cancer Sister        breast   Cancer Sister        ovarian    Social History Social History   Tobacco Use   Smoking status: Never   Smokeless tobacco: Never  Vaping Use   Vaping status: Never Used  Substance Use Topics   Alcohol use: No   Drug use: No     Allergies   Metoclopramide hcl, Metoclopramide, and Glipizide   Review of Systems Review of Systems  Constitutional:  Positive for activity change.  Negative for appetite change, fatigue and fever.  HENT:  Positive for congestion and postnasal drip. Negative for sinus pressure, sneezing and sore throat.   Eyes:  Positive for discharge and redness. Negative for photophobia, pain, itching and visual disturbance.  Respiratory:  Positive for cough. Negative for shortness of breath.   Cardiovascular:  Negative for chest pain.  Gastrointestinal:  Negative for abdominal pain, diarrhea, nausea and vomiting.  Neurological:  Negative for dizziness, light-headedness and headaches.     Physical Exam Triage Vital Signs ED Triage Vitals [01/14/23 1321]  Encounter Vitals Group     BP 123/69     Systolic BP Percentile      Diastolic BP Percentile      Pulse Rate 92     Resp      Temp 99 F (37.2 C)     Temp Source Oral     SpO2 95 %     Weight      Height      Head Circumference      Peak Flow      Pain Score 0     Pain Loc      Pain Education      Exclude from Growth Chart    No data found.  Updated Vital Signs BP 123/69 (BP Location: Left Arm)   Pulse 92   Temp 99 F (37.2 C) (Oral)   SpO2 95%   Visual Acuity Right  Eye Distance:   Left Eye Distance:   Bilateral Distance:    Right Eye Near:   Left Eye Near:    Bilateral Near:     Physical Exam Vitals reviewed.  Constitutional:      General: He is awake.     Appearance: Normal appearance. He is well-developed. He is not ill-appearing.     Comments: Very pleasant male appears stated age in no acute distress sitting comfortably in exam room  HENT:     Head: Normocephalic and atraumatic.     Right Ear: Tympanic membrane, ear canal and external ear normal. Tympanic membrane is not erythematous or bulging.     Left Ear: Tympanic membrane, ear canal and external ear normal. Tympanic membrane is not erythematous or bulging.     Nose: Rhinorrhea present. Rhinorrhea is clear.     Mouth/Throat:     Pharynx: Uvula midline. Posterior oropharyngeal erythema and postnasal drip present. No oropharyngeal exudate.  Eyes:     Conjunctiva/sclera:     Right eye: Right conjunctiva is not injected. No chemosis.    Left eye: Left conjunctiva is injected. No chemosis.    Comments: Purulent drainage noted inferior portion of left eye with associated injection of conjunctiva.  Normal extraocular movements.  Cardiovascular:     Rate and Rhythm: Normal rate and regular rhythm.     Heart sounds: Normal heart sounds, S1 normal and S2 normal. No murmur heard. Pulmonary:     Effort: Pulmonary effort is normal. No accessory muscle usage or respiratory distress.     Breath sounds: Normal breath sounds. No stridor. No wheezing, rhonchi or rales.     Comments: Clear to auscultation bilaterally Neurological:     Mental Status: He is alert.  Psychiatric:        Behavior: Behavior is cooperative.      UC Treatments / Results  Labs (all labs ordered are listed, but only abnormal results are displayed) Labs Reviewed  POC COVID19/FLU A&B COMBO    EKG   Radiology No results found.  Procedures Procedures (including critical care time)  Medications Ordered in  UC Medications - No data to display  Initial Impression / Assessment and Plan / UC Course  I have reviewed the triage vital signs and the nursing notes.  Pertinent labs & imaging results that were available during my care of the patient were reviewed by me and considered in my medical decision making (see chart for details).     Patient is well-appearing, afebrile, nontoxic, nontachycardic.  COVID and flu testing were negative.  No evidence of acute infection on physical exam that would warrant initiation of antibiotics.  Discussed likely viral etiology.  Will start ipratropium nasal spray to help manage symptoms as well as Tessalon for cough.  He can use over-the-counter medications as needed.  Recommended nasal saline and sinus rinses.  Given significant drainage from the left eye will cover for bacterial conjunctivitis with Polytrim drops.  He was encouraged to wash his hands prior to handling medication and avoid touching tip of medication bottle to the eye.  He is to keep the area clean with warm rag.  Discussed that if his symptoms are not improving by next week he should follow-up with his primary care.  If anything worsens or changes and he has high fever, worsening cough, shortness of breath, nausea, vomiting he needs to be seen immediately.  Strict return precautions given.  Final Clinical Impressions(s) / UC Diagnoses   Final diagnoses:  Upper respiratory tract infection, unspecified type  Nasal congestion  Bacterial conjunctivitis of left eye     Discharge Instructions      You tested negative for COVID and flu.  I suspect you have a different viral illness.  Start ipratropium nasal spray to help with your congestion.  You can also use nasal saline sinus rinses.  Use Tessalon for cough.  Make sure that you rest and drink plenty of fluid.  I am concerned that you have an infection in your eye so please use Polytrim drops every 6 hours while awake for 7 days.  Use warm compress to  keep the eye clean.  Follow-up with your primary care next week if your symptoms are not improving.  If anything worsens and you have high fever, chest pain, shortness of breath, nausea, vomiting you need to be seen immediately.     ED Prescriptions     Medication Sig Dispense Auth. Provider   trimethoprim-polymyxin b (POLYTRIM) ophthalmic solution Place 1 drop into the left eye every 6 (six) hours. 10 mL Salma Walrond K, PA-C   ipratropium (ATROVENT) 0.03 % nasal spray Place 1 spray into both nostrils every 12 (twelve) hours. 30 mL Arif Amendola K, PA-C   benzonatate (TESSALON) 100 MG capsule Take 1 capsule (100 mg total) by mouth every 8 (eight) hours. 21 capsule Calven Gilkes K, PA-C      PDMP not reviewed this encounter.   Jeani Hawking, PA-C 01/14/23 1420

## 2023-01-14 NOTE — Discharge Instructions (Signed)
You tested negative for COVID and flu.  I suspect you have a different viral illness.  Start ipratropium nasal spray to help with your congestion.  You can also use nasal saline sinus rinses.  Use Tessalon for cough.  Make sure that you rest and drink plenty of fluid.  I am concerned that you have an infection in your eye so please use Polytrim drops every 6 hours while awake for 7 days.  Use warm compress to keep the eye clean.  Follow-up with your primary care next week if your symptoms are not improving.  If anything worsens and you have high fever, chest pain, shortness of breath, nausea, vomiting you need to be seen immediately.

## 2023-01-14 NOTE — ED Triage Notes (Addendum)
Pt c/o head congestion, cough onset Wednesday night, has not been checking temperatures. Pt states entire household sick, grandson in daycare, pt also reports LT eye matted since sxs started.

## 2023-02-08 ENCOUNTER — Ambulatory Visit: Payer: Self-pay | Admitting: *Deleted

## 2023-02-08 NOTE — Telephone Encounter (Signed)
  Chief Complaint: Right lower abd pain Symptoms: sharp pain that is intermittent with bending or deep breath Frequency: A week ago it started Pertinent Negatives: Patient denies vomiting, diarrhea, or injuries or abd surgeries recently Disposition: [] ED /[] Urgent Care (no appt availability in office) / [x] Appointment(In office/virtual)/ []  [facility]/ [] Home Care/ [] Refused Recommended Disposition /[] [place] [facility]/ []  Follow-up with PCP Additional Notes: Next available appt made with Charlcie Cradle FNP for 02/13/2023 at 11:00.   He requested 12/31 or 02/15/2023 because he was available all day.    He works night shift.

## 2023-02-08 NOTE — Telephone Encounter (Signed)
Reason for Disposition  [1] MODERATE pain (e.g., interferes with normal activities) AND [2] pain comes and goes (cramps) AND [3] present > 24 hours  (Exception: Pain with Vomiting or Diarrhea - see that Guideline.)  Answer Assessment - Initial Assessment Questions 1. LOCATION: "Where does it hurt?"      I'm having pain in abd.   Started near right of belly button to the right.   If I breath or bend it's a sharp pain.    No vomiting or diarrhea.   No injuries.    2. RADIATION: "Does the pain shoot anywhere else?" (e.g., chest, back)     No    It's on lower right side. 3. ONSET: "When did the pain begin?" (Minutes, hours or days ago)      A week 4. SUDDEN: "Gradual or sudden onset?"     Not asked 5. PATTERN "Does the pain come and go, or is it constant?"    - If it comes and goes: "How long does it last?" "Do you have pain now?"     (Note: Comes and goes means the pain is intermittent. It goes away completely between bouts.)    - If constant: "Is it getting better, staying the same, or getting worse?"      (Note: Constant means the pain never goes away completely; most serious pain is constant and gets worse.)      Intermittent 6. SEVERITY: "How bad is the pain?"  (e.g., Scale 1-10; mild, moderate, or severe)    - MILD (1-3): Doesn't interfere with normal activities, abdomen soft and not tender to touch.     - MODERATE (4-7): Interferes with normal activities or awakens from sleep, abdomen tender to touch.     - SEVERE (8-10): Excruciating pain, doubled over, unable to do any normal activities.       It's a sharp pain 7. RECURRENT SYMPTOM: "Have you ever had this type of stomach pain before?" If Yes, ask: "When was the last time?" and "What happened that time?"      No 8. CAUSE: "What do you think is causing the stomach pain?"     I don't know 9. RELIEVING/AGGRAVATING FACTORS: "What makes it better or worse?" (e.g., antacids, bending or twisting motion, bowel movement)     Bending or deep  breaths  10. OTHER SYMPTOMS: "Do you have any other symptoms?" (e.g., back pain, diarrhea, fever, urination pain, vomiting)       No diarrhea or vomiting.  Protocols used: Abdominal Pain - Male-A-AH

## 2023-02-13 ENCOUNTER — Ambulatory Visit: Payer: No Typology Code available for payment source | Admitting: Family Medicine

## 2023-02-26 ENCOUNTER — Ambulatory Visit (INDEPENDENT_AMBULATORY_CARE_PROVIDER_SITE_OTHER): Payer: No Typology Code available for payment source | Admitting: Podiatry

## 2023-02-26 DIAGNOSIS — Z91198 Patient's noncompliance with other medical treatment and regimen for other reason: Secondary | ICD-10-CM

## 2023-02-26 NOTE — Progress Notes (Signed)
 1. Failure to attend appointment with reason given   Called to r/s due to 2 hour delay in office.

## 2023-02-28 ENCOUNTER — Other Ambulatory Visit: Payer: Self-pay | Admitting: Family Medicine

## 2023-02-28 DIAGNOSIS — G4726 Circadian rhythm sleep disorder, shift work type: Secondary | ICD-10-CM

## 2023-02-28 NOTE — Telephone Encounter (Signed)
 Requested medication (s) are due for refill today: yes  Requested medication (s) are on the active medication list: yes  Last refill:  08/30/22  Future visit scheduled: yes  Notes to clinic:  Unable to refill per protocol, cannot delegate.      Requested Prescriptions  Pending Prescriptions Disp Refills   Armodafinil  150 MG tablet [Pharmacy Med Name: ARMODAFINIL  150 MG TABLET] 30 tablet     Sig: TAKE 1 TABLET BY MOUTH EVERY DAY     Not Delegated - Psychiatry:  Stimulants/ADHD Failed - 02/28/2023  3:40 PM      Failed - This refill cannot be delegated      Failed - Urine Drug Screen completed in last 360 days      Failed - Valid encounter within last 6 months    Recent Outpatient Visits           11 months ago Sore throat   [facility] Lamon Pillow, MD   11 months ago Syncope, unspecified syncope type   [facility] Lamon Pillow, MD   1 year ago Urinary frequency   [place] [facility] Trenton Frock, PA-C   2 years ago Annual physical exam   [facility] Lamon Pillow, MD   3 years ago Candida infection   [facility] Gordon Latus, [place]       Future Appointments             In 1 month Shann Darnel, Erlinda Haws, MD [facility]            Passed - Last BP in normal range    BP Readings from Last 1 Encounters:  01/14/23 123/69         Passed - Last Heart Rate in normal range    Pulse Readings from Last 1 Encounters:  01/14/23 92

## 2023-03-08 ENCOUNTER — Ambulatory Visit (INDEPENDENT_AMBULATORY_CARE_PROVIDER_SITE_OTHER): Payer: No Typology Code available for payment source | Admitting: Podiatry

## 2023-03-08 DIAGNOSIS — Z91198 Patient's noncompliance with other medical treatment and regimen for other reason: Secondary | ICD-10-CM

## 2023-03-08 NOTE — Progress Notes (Signed)
1. Failure to attend appointment with reason given    Patient canceled.

## 2023-04-09 ENCOUNTER — Encounter: Payer: Self-pay | Admitting: Family Medicine

## 2023-04-09 ENCOUNTER — Ambulatory Visit (INDEPENDENT_AMBULATORY_CARE_PROVIDER_SITE_OTHER): Payer: BC Managed Care – PPO | Admitting: Family Medicine

## 2023-04-09 VITALS — BP 151/89 | HR 74 | Resp 18 | Ht 76.0 in | Wt 271.8 lb

## 2023-04-09 DIAGNOSIS — Z0000 Encounter for general adult medical examination without abnormal findings: Secondary | ICD-10-CM | POA: Diagnosis not present

## 2023-04-09 DIAGNOSIS — R55 Syncope and collapse: Secondary | ICD-10-CM

## 2023-04-09 DIAGNOSIS — N529 Male erectile dysfunction, unspecified: Secondary | ICD-10-CM | POA: Diagnosis not present

## 2023-04-09 DIAGNOSIS — I456 Pre-excitation syndrome: Secondary | ICD-10-CM

## 2023-04-09 DIAGNOSIS — E781 Pure hyperglyceridemia: Secondary | ICD-10-CM | POA: Diagnosis not present

## 2023-04-09 DIAGNOSIS — N1832 Chronic kidney disease, stage 3b: Secondary | ICD-10-CM

## 2023-04-09 DIAGNOSIS — K7581 Nonalcoholic steatohepatitis (NASH): Secondary | ICD-10-CM

## 2023-04-09 DIAGNOSIS — Z125 Encounter for screening for malignant neoplasm of prostate: Secondary | ICD-10-CM | POA: Diagnosis not present

## 2023-04-09 DIAGNOSIS — I10 Essential (primary) hypertension: Secondary | ICD-10-CM

## 2023-04-09 DIAGNOSIS — E0840 Diabetes mellitus due to underlying condition with diabetic neuropathy, unspecified: Secondary | ICD-10-CM

## 2023-04-09 DIAGNOSIS — Z8589 Personal history of malignant neoplasm of other organs and systems: Secondary | ICD-10-CM | POA: Insufficient documentation

## 2023-04-09 DIAGNOSIS — R413 Other amnesia: Secondary | ICD-10-CM

## 2023-04-09 DIAGNOSIS — E1140 Type 2 diabetes mellitus with diabetic neuropathy, unspecified: Secondary | ICD-10-CM

## 2023-04-09 MED ORDER — PREVNAR 20 0.5 ML IM SUSY: 0.5000 mL | PREFILLED_SYRINGE | INTRAMUSCULAR | 0 refills | Status: AC

## 2023-04-09 MED ORDER — GABAPENTIN 100 MG PO CAPS: 100.0000 mg | ORAL_CAPSULE | Freq: Two times a day (BID) | ORAL | 5 refills | Status: DC

## 2023-04-09 MED ORDER — TADALAFIL 10 MG PO TABS: 10.0000 mg | ORAL_TABLET | ORAL | 1 refills | Status: DC | PRN

## 2023-04-09 NOTE — Progress Notes (Signed)
 Complete physical exam   Patient: Howard Young   DOB: [birth year]   63 y.o. Male  MRN: [id number] Visit Date: 04/09/2023  Today's healthcare provider: Mila Merry, MD   Chief Complaint  Patient presents with   Annual Exam    Balance and dizziness concerns   Subjective    Discussed the use of AI scribe software for clinical note transcription with the patient, who gave verbal consent to proceed.  History of Present Illness   Howard Young "Nadine Counts" is a 63 year old male who presents for an annual physical exam.  He experiences episodes of syncope and fatigue, particularly at work, despite normal findings on a previous seven-day heart monitor test. He has intermittent shortness of breath and fatigue during work but not during gym activities. He mentions a previous short run of PACs and has not made an appointment with the cardiologist due to work constraints. He was also previously referred to neurology but has not heard back from their office despite contacting him. He works and [employer]ar[employer] manager and state he prefers see [employer] in [place] for convenience.  He is followed by Dr. Gershon Crane for diabetes and has appointment in March. He has been out of gabapentin for neuropathy or a while, which he uses for neuropathy, and is currently out of other medications due to issues with his flex spending account. He plans to refill [facility] at [facility]. He reports balance issues, needing to hold onto guardrails when using stairs, and describes episodes where he feels like the stairs are moving, causing him to hold onto the rails for stability. He also has difficulty lifting his foot while driving, describing it as feeling 'really, really heavy'.  He mentions memory issues, such as difficulty recalling names and trivia, and has been using Prevagen inconsistently. Previous B12 levels were normal, and autoimmune labs were done last March.  He has a history of Barrett's esophagus, which he no longer  takes medication for after a previous EGD showed no need. He also mentions a family history of Barrett's esophagus, with both his son and wife having had it, but none currently experiencing symptoms.  He also reports  a skin cancer,  having undergone Mohs surgery twice, with the last one being a couple of years ago. He does not see a dermatologist regularly but goes if something arises.     Lab Results  Component Value Date   HGBA1C 6.0 02/17/2022   Last metabolic panel Lab Results  Component Value Date   GLUCOSE 108 (H) 03/09/2022   NA 134 03/09/2022   K 5.0 03/09/2022   CL 92 (L) 03/09/2022   CO2 24 03/09/2022   BUN 23 03/09/2022   CREATININE 1.86 (H) 03/09/2022   EGFR 40 (L) 03/09/2022   CALCIUM 10.3 (H) 03/09/2022   PHOS 3.0 09/17/2014   PROT 7.5 03/09/2022   ALBUMIN 4.5 03/09/2022   LABGLOB 3.0 03/09/2022   AGRATIO 1.5 03/09/2022   BILITOT 1.3 (H) 03/09/2022   ALKPHOS 53 03/09/2022   AST 47 (H) 03/09/2022   ALT 42 03/09/2022   ANIONGAP 8 04/06/2020   Lab Results  Component Value Date   WBC 7.5 03/09/2022   HGB 16.5 03/09/2022   HCT 48.5 03/09/2022   MCV 84 03/09/2022   PLT 289 03/09/2022   B12 = 455 and endocrine 10/10/2022  Lab Results  Component Value Date   CHOL 158 07/25/2021   HDL 43 07/25/2021   LDLCALC 85 07/25/2021   TRIG 149  07/25/2021   CHOLHDL 2.8 02/04/2018       Past Medical History:  Diagnosis Date   Barrett esophagus    Diabetes mellitus without complication (HCC)    GERD (gastroesophageal reflux disease)    Hypertension    Morbid obesity (HCC)    Pneumonia    Sleep apnea    Past Surgical History:  Procedure Laterality Date   BREATH TEK H PYLORI  04/25/2011   Procedure: BREATH TEK H PYLORI;  Surgeon: Valarie Merino, MD;  Location: [facility];  Service: General;  Laterality: N/A;   CATARACT EXTRACTION, BILATERAL  2002   CHOLECYSTECTOMY  2001   COLONOSCOPY WITH PROPOFOL N/A 03/08/2020   Procedure: COLONOSCOPY WITH PROPOFOL;   Surgeon: Toney Reil, MD;  Location: [facility];  Service: Gastroenterology;  Laterality: N/A;   ESOPHAGOGASTRODUODENOSCOPY (EGD) WITH PROPOFOL  03/08/2020   Procedure: ESOPHAGOGASTRODUODENOSCOPY (EGD) WITH PROPOFOL;  Surgeon: Toney Reil, MD;  Location: [facility];  Service: Gastroenterology;;   heart ablation  1993   JOINT REPLACEMENT  2012   right hip   LAPAROSCOPIC GASTRIC BANDING  2008   MASS EXCISION N/A 04/14/2020   Procedure: EXCISION MASS (EXCISION OF ABDOMINAL WALL MASS- AV MALFORMATION );  Surgeon: Renford Dills, MD;  Location: [facility];  Service: Vascular;  Laterality: N/A;   SCROTAL EXPLORATION N/A 11/09/2016   Procedure: SCROTUM EXPLORATION;  Surgeon: Hildred Laser, MD;  Location: [facility];  Service: Urology;  Laterality: N/A;   Social History   Socioeconomic History   Marital status: Married    Spouse name: Sheri   Number of children: Not on file   Years of education: Not on file   Highest education level: Not on file  Occupational History   Not on file  Tobacco Use   Smoking status: Never   Smokeless tobacco: Never  Vaping Use   Vaping status: Never Used  Substance and Sexual Activity   Alcohol use: No   Drug use: No   Sexual activity: Not on file  Other Topics Concern   Not on file  Social History Narrative   Not on file   Social Drivers of Health   Financial Resource Strain: Not on file  Food Insecurity: Not on file  Transportation Needs: Not on file  Physical Activity: Not on file  Stress: Not on file  Social Connections: Not on file  Intimate Partner Violence: Not on file   Family Status  Relation Name Status   Mother  Deceased at age 31   Father  Deceased at age 57       suicide   Sister  Alive   Sister  Alive   Sister  Alive  No partnership data on file   Family History  Problem Relation Age of Onset   Cancer Mother        breast, lung   Diabetes Mother    Heart failure Mother    Cancer Father         colon   Cancer Sister        breast   Cancer Sister        ovarian   Allergies  Allergen Reactions   Metoclopramide Hcl Shortness Of Breath and Anxiety   Metoclopramide Other (See Comments)   Glipizide Other (See Comments)    Profound weigh gain  States he has no problems with this medication other than weight gain    Profound weigh gain    Patient Care Team: Malva Limes, MD  as PCP - General (Family Medicine) Sherlon Handing, MD as Consulting Physician (Internal Medicine) Toney Reil, MD as Consulting Physician (Gastroenterology) [facility], [facility] (Optometry) Mosetta Pigeon, MD (Nephrology)   Medications: Outpatient Medications Prior to Visit  Medication Sig   Armodafinil 150 MG tablet TAKE 1 TABLET BY MOUTH EVERY DAY   B-D UF III MINI PEN NEEDLES 31G X 5 MM MISC SMARTSIG:1 Each SUB-Q Twice Daily   Berberine-RedIsoAlphaAcd-D-K (OSTERA) TABS Take by mouth.   Continuous Blood Gluc Transmit (DEXCOM G6 TRANSMITTER) MISC USE TO MONITOR BLOOD SUGAR. REPLACE EVERY 3 MONTHS   gabapentin (NEURONTIN) 100 MG capsule Take by mouth.   Insulin Pen Needle (B-D UF III MINI PEN NEEDLES) 31G X 5 MM MISC See admin instructions.   lisinopril-hydrochlorothiazide (ZESTORETIC) 20-12.5 MG tablet Take 2 tablets by mouth daily.   Magnesium 200 MG CHEW Chew 400 mg by mouth daily.   metFORMIN (GLUCOPHAGE) 500 MG tablet Take 1,000 mg by mouth 2 (two) times daily.   metFORMIN (GLUCOPHAGE-XR) 500 MG 24 hr tablet Take 1,000 mg by mouth 2 (two) times daily.   Multiple Vitamin (MULTIVITAMIN WITH MINERALS) TABS tablet Take 1 tablet by mouth daily.   OZEMPIC, 0.25 OR 0.5 MG/DOSE, 2 MG/1.5ML SOPN Inject into the skin.   tadalafil (CIALIS) 10 MG tablet Take 1 tablet (10 mg total) by mouth every other day as needed for erectile dysfunction.   tamsulosin (FLOMAX) 0.4 MG CAPS capsule TAKE 1 CAPSULE BY MOUTH  DAILY   TOUJEO SOLOSTAR 300 UNIT/ML Solostar Pen Inject 70 Units into the skin at  bedtime.   trimethoprim-polymyxin b (POLYTRIM) ophthalmic solution Place 1 drop into the left eye every 6 (six) hours.   benzonatate (TESSALON) 100 MG capsule Take 1 capsule (100 mg total) by mouth every 8 (eight) hours.   ipratropium (ATROVENT) 0.03 % nasal spray Place 1 spray into both nostrils every 12 (twelve) hours.   No facility-administered medications prior to visit.     Objective    BP (!) 151/89   Pulse 74   Resp 18   Ht 6\' 4"  (1.93 m)   Wt 271 lb 12.8 oz (123.3 kg)   SpO2 98%   BMI 33.08 kg/m    Physical Exam  General Appearance:    Obese male. Alert, cooperative, in no acute distress, appears stated age  Head:    Normocephalic, without obvious abnormality, atraumatic  Eyes:    PERRL, conjunctiva/corneas clear, EOM's intact, fundi    benign, both eyes       Ears:    Normal TM's and external ear canals, both ears  Nose:   Nares normal, septum midline, mucosa normal, no drainage   or sinus tenderness  Throat:   Lips, mucosa, and tongue normal; teeth and gums normal  Neck:   Supple, symmetrical, trachea midline, no adenopathy;       thyroid:  No enlargement/tenderness/nodules; no carotid   bruit or JVD  Back:     Symmetric, no curvature, ROM normal, no CVA tenderness  Lungs:     Clear to auscultation bilaterally, respirations unlabored  Chest wall:    No tenderness or deformity  Heart:    Normal heart rate. Normal rhythm. No murmurs, rubs, or gallops.  S1 and S2 normal  Abdomen:     Soft, non-tender, bowel sounds active all four quadrants,    no masses, no organomegaly  Genitalia:    deferred  Rectal:    deferred  Extremities:   All extremities  are intact. No cyanosis or edema  Pulses:   2+ and symmetric all extremities  Skin:   Skin color, texture, turgor normal. Multiple Sks and cherry hemangiomas across back. Large acrochordon right of midline.    Lymph nodes:   Cervical, supraclavicular, and axillary nodes normal  Neurologic:   CNII-XII intact. Normal  strength, sensation and reflexes      throughout       Last depression screening scores    04/09/2023    9:09 AM 12/27/2021    9:10 AM 02/08/2021    8:46 AM  PHQ 2/9 Scores  PHQ - 2 Score 0 0 0  PHQ- 9 Score 0 0    Last fall risk screening    04/09/2023    9:09 AM  Fall Risk   Falls in the past year? 1  Number falls in past yr: 1  Injury with Fall? 0   Last Audit-C alcohol use screening    12/27/2021    9:10 AM  Alcohol Use Disorder Test (AUDIT)  1. How often do you have a drink containing alcohol? 0  2. How many drinks containing alcohol do you have on a typical day when you are drinking? 0  3. How often do you have six or more drinks on one occasion? 0  AUDIT-C Score 0   A score of 3 or more in women, and 4 or more in men indicates increased risk for alcohol abuse, EXCEPT if all of the points are from question 1   No results found for any visits on 04/09/23.  Assessment & Plan    Routine Health Maintenance and Physical Exam  Exercise Activities and Dietary recommendations  Goals   None     Immunization History  Administered Date(s) Administered   Influenza Inj Mdck Quad Pf 12/01/2017   Influenza,inj,Quad PF,6+ Mos 11/28/2012, 02/23/2014, 11/22/2015, 10/23/2016, 01/07/2020, 12/12/2021   Influenza-Unspecified 11/14/2014, 11/09/2016   PFIZER(Purple Top)SARS-COV-2 Vaccination 04/20/2019, 05/13/2019, 01/21/2020   Pneumococcal Polysaccharide-23 11/28/2012   Tdap 02/08/2021   Zoster Recombinant(Shingrix) 02/04/2018, 04/08/2018    Health Maintenance  Topic Date Due   Pneumococcal Vaccine 69-79 Years old (2 of 2 - PCV) 11/28/2013   OPHTHALMOLOGY EXAM  02/28/2019   Diabetic kidney evaluation - Urine ACR  05/17/2021   HEMOGLOBIN A1C  08/18/2022   INFLUENZA VACCINE  09/14/2022   COVID-19 Vaccine (4 - 2024-25 season) 10/15/2022   Colonoscopy  03/09/2023   Diabetic kidney evaluation - eGFR measurement  03/10/2023   DTaP/Tdap/Td (2 - Td or Tdap) 02/09/2031    Hepatitis C Screening  Completed   HIV Screening  Completed   Zoster Vaccines- Shingrix  Completed   HPV VACCINES  Aged Out    Discussed health benefits of physical activity, and encouraged him to engage in regular exercise appropriate for his age and condition.     Syncope and Shortness of Breath History of syncope with recent episode resulting in head injury. Shortness of breath noted during work activities but not during gym workouts. Previous 7-day heart monitor did not capture any episodes. Patient has not yet seen a cardiologist as previously recommended. -Refer to cardiologist for further evaluation. -Refer to [facility] for neurological evaluation.  Diabetes Mellitus Patient is on Dexcom, Ozempic, and Trulicity. Follow-up with endocrinologist scheduled for next week. -Refill Gabapentin for neuropathy -Check lipids today.  Skin Health History of Mohs surgeries x 2. The last I have a record of is SCC of lip removed in 2023. He has not had recent full  body skin exam, but has been followed by Dr. Adolphus Birchwood in the past. Patient reports thin skin and easy bruising. -Refer to dermatologist for skin examination.  Colon Health History of multiple polyps removed during colonoscopy three years ago. Due for follow up endoscopy.  -Refer follow-up colonoscopy.  General Health Maintenance -Check PSA today. -Recommend Pneumonia vaccine, to be administered at pharmacy. -Continue Lisinopril and Tamsulosin as needed.          Mila Merry, MD  [facility] [phone] (phone) [phone] (fax)  [facility]

## 2023-04-10 ENCOUNTER — Encounter: Payer: Self-pay | Admitting: Family Medicine

## 2023-04-10 ENCOUNTER — Other Ambulatory Visit: Payer: Self-pay | Admitting: Family Medicine

## 2023-04-10 DIAGNOSIS — E559 Vitamin D deficiency, unspecified: Secondary | ICD-10-CM | POA: Insufficient documentation

## 2023-04-10 DIAGNOSIS — E781 Pure hyperglyceridemia: Secondary | ICD-10-CM

## 2023-04-10 LAB — PSA TOTAL (REFLEX TO FREE): Prostate Specific Ag, Serum: 0.9 ng/mL (ref 0.0–4.0)

## 2023-04-10 LAB — LIPID PANEL WITH LDL/HDL RATIO
Cholesterol, Total: 167 mg/dL (ref 100–199)
HDL: 52 mg/dL (ref >39)
LDL Chol Calc (NIH): 93 mg/dL (ref 0–99)
LDL/HDL Ratio: 1.8 ratio (ref 0.0–3.6)
Triglycerides: 125 mg/dL (ref 0–149)
VLDL Cholesterol Cal: 22 mg/dL (ref 5–40)

## 2023-04-10 LAB — LDL CHOLESTEROL, DIRECT: LDL Direct: 94 mg/dL (ref 0–99)

## 2023-04-10 LAB — VITAMIN D 25 HYDROXY (VIT D DEFICIENCY, FRACTURES): Vit D, 25-Hydroxy: 29.4 ng/mL — ABNORMAL LOW (ref 30.0–100.0)

## 2023-04-10 MED ORDER — ROSUVASTATIN CALCIUM 5 MG PO TABS: 5.0000 mg | ORAL_TABLET | Freq: Every day | ORAL | 1 refills | Status: DC

## 2023-06-04 DIAGNOSIS — E1159 Type 2 diabetes mellitus with other circulatory complications: Secondary | ICD-10-CM | POA: Diagnosis not present

## 2023-06-04 DIAGNOSIS — E1169 Type 2 diabetes mellitus with other specified complication: Secondary | ICD-10-CM | POA: Diagnosis not present

## 2023-06-04 DIAGNOSIS — Z794 Long term (current) use of insulin: Secondary | ICD-10-CM | POA: Diagnosis not present

## 2023-06-04 DIAGNOSIS — E1142 Type 2 diabetes mellitus with diabetic polyneuropathy: Secondary | ICD-10-CM | POA: Diagnosis not present

## 2023-06-11 ENCOUNTER — Encounter: Payer: Self-pay | Admitting: Podiatry

## 2023-06-11 ENCOUNTER — Ambulatory Visit: Payer: Self-pay | Admitting: Podiatry

## 2023-06-11 VITALS — Ht 76.0 in | Wt 271.8 lb

## 2023-06-11 DIAGNOSIS — E0840 Diabetes mellitus due to underlying condition with diabetic neuropathy, unspecified: Secondary | ICD-10-CM | POA: Diagnosis not present

## 2023-06-11 DIAGNOSIS — B351 Tinea unguium: Secondary | ICD-10-CM

## 2023-06-11 DIAGNOSIS — M79675 Pain in left toe(s): Secondary | ICD-10-CM

## 2023-06-11 DIAGNOSIS — M79674 Pain in right toe(s): Secondary | ICD-10-CM | POA: Diagnosis not present

## 2023-06-11 NOTE — Progress Notes (Signed)
  Subjective:  Patient ID: Howard Young, male    DOB: 22-Aug-1960,  MRN: [id number]  63 y.o. male presents at risk foot care with history of diabetic neuropathy and painful, elongated thickened toenails x 10 which are symptomatic when wearing enclosed shoe gear. This interferes with his/her daily activities. Chief Complaint  Patient presents with   Nail Problem    Pt is here for [facility] last A1C was 6.9 PCP is Dr Shann Darnel and LOV was in February.   New problem(s): None   PCP is Lamon Pillow, MD.  Allergies  Allergen Reactions   Metoclopramide Hcl Shortness Of Breath and Anxiety   Metoclopramide Other (See Comments)   Glipizide Other (See Comments)    Profound weigh gain  States he has no problems with this medication other than weight gain    Profound weigh gain    Review of Systems: Negative except as noted in the HPI.   Objective:  Howard Young is a pleasant 63 y.o. male obese in NAD. AAO x 3.  Vascular Examination: Vascular status intact b/l with palpable pedal pulses. CFT immediate b/l. Pedal hair present. No edema. No pain with calf compression b/l. Skin temperature gradient WNL b/l. No varicosities noted. No cyanosis or clubbing noted.  Neurological Examination: Sensation grossly intact b/l with 10 gram monofilament. Vibratory sensation intact b/l. Pt has subjective symptoms of neuropathy. Dermatological Examination: Pedal skin with normal turgor, texture and tone b/l. No open wounds nor interdigital macerations noted. Toenails 1-5 b/l thick, discolored, elongated with subungual debris and pain on dorsal palpation. No hyperkeratotic lesions noted b/l.   Musculoskeletal Examination: Muscle strength 5/5 to b/l LE.  No pain, crepitus noted b/l. No gross pedal deformities. Patient ambulates independently without assistive aids.   Radiographs: None  Last A1c:       No data to display           Assessment:   1. Pain due to onychomycosis of toenails of both  feet   2. Diabetes mellitus due to underlying condition with diabetic neuropathy, unspecified whether long term insulin  use (HCC)    Plan:  Patient was evaluated and treated. All patient's and/or POA's questions/concerns addressed on today's visit. Toenails 1-5 debrided in length and girth without incident. Continue foot and shoe inspections daily. Monitor blood glucose per PCP/Endocrinologist's recommendations. Continue soft, supportive shoe gear daily. Report any pedal injuries to medical professional. Call office if there are any questions/concerns. -Patient/POA to call should there be question/concern in the interim.  Return in about 3 months (around 09/10/2023).  Luella Sager, DPM      [place] LOCATION: [address] [place] [address]                   Office [phone]   [facility] LOCATION: [address] [place], [place] [address] Office [phone]

## 2023-09-10 ENCOUNTER — Ambulatory Visit (INDEPENDENT_AMBULATORY_CARE_PROVIDER_SITE_OTHER): Payer: Self-pay | Admitting: Podiatry

## 2023-09-10 DIAGNOSIS — Z91199 Patient's noncompliance with other medical treatment and regimen due to unspecified reason: Secondary | ICD-10-CM

## 2023-09-10 NOTE — Progress Notes (Signed)
 1. No-show for appointment

## 2023-09-26 ENCOUNTER — Telehealth: Payer: Self-pay | Admitting: *Deleted

## 2023-09-26 NOTE — Telephone Encounter (Signed)
 Unable to LVM to verify card hx due to full mailbox.

## 2023-09-27 ENCOUNTER — Ambulatory Visit: Payer: Self-pay | Attending: Cardiovascular Disease | Admitting: Cardiovascular Disease

## 2023-09-27 ENCOUNTER — Encounter: Payer: Self-pay | Admitting: Cardiovascular Disease

## 2023-09-27 VITALS — BP 128/76 | HR 56 | Ht 76.0 in | Wt 283.8 lb

## 2023-09-27 DIAGNOSIS — E785 Hyperlipidemia, unspecified: Secondary | ICD-10-CM

## 2023-09-27 DIAGNOSIS — I493 Ventricular premature depolarization: Secondary | ICD-10-CM | POA: Diagnosis not present

## 2023-09-27 DIAGNOSIS — E781 Pure hyperglyceridemia: Secondary | ICD-10-CM | POA: Diagnosis not present

## 2023-09-27 DIAGNOSIS — I447 Left bundle-branch block, unspecified: Secondary | ICD-10-CM

## 2023-09-27 DIAGNOSIS — R55 Syncope and collapse: Secondary | ICD-10-CM

## 2023-09-27 DIAGNOSIS — I472 Ventricular tachycardia, unspecified: Secondary | ICD-10-CM

## 2023-09-27 MED ORDER — ROSUVASTATIN CALCIUM 20 MG PO TABS: 20.0000 mg | ORAL_TABLET | Freq: Every day | ORAL | 1 refills | Status: AC

## 2023-09-27 MED ORDER — LISINOPRIL 40 MG PO TABS: 40.0000 mg | ORAL_TABLET | Freq: Every day | ORAL | 1 refills | Status: AC

## 2023-09-27 NOTE — Patient Instructions (Signed)
 Medication Instructions:  Your physician recommends the following medication changes.  STOP TAKING: Lisinopril -Hydrochlorothiazide   START TAKING: Lisinopril  40 mg once daily  INCREASE: Rosuvastatin  to 20 mg once daily  *If you need a refill on your cardiac medications before your next appointment, please call your pharmacy*  Lab Work: Your provider would like for you to return in 6 weeks to have the following labs drawn: Lipid and CMET.   Please go to [facility] [address] ([facility]) [address], [place] [address] You do not need an appointment.  They are open from 8 am- 4:30 pm.  Lunch from 1:00 pm- 2:00 pm You will need to be fasting.   You may also go to one of the following [facility]:  [address] [place], [place] [address] Phone: [phone] Lab hours: Mon-Fri 8 am- 5 pm    Lunch 12 pm- 1 pm  [address] [place],  [place]  [address]  [place]  Phone: [phone] Lab hours: 7 am- 4 pm Lunch 12 pm-1 pm   [address] [place],  [place]  [address]  [place]  Phone: [phone] Lab hours: Mon-Fri 8 am- 5 pm    Lunch 12 pm- 1 pm  If you have labs (blood work) drawn today and your tests are completely normal, you will receive your results only by: MyChart Message (if you have MyChart) OR A paper copy in the mail If you have any lab test that is abnormal or we need to change your treatment, we will call you to review the results.  Testing/Procedures: Your physician has requested that you have an echocardiogram. Echocardiography is a painless test that uses sound waves to create images of your heart. It provides your doctor with information about the size and shape of your heart and how well your heart's chambers and valves are working.   You may receive an ultrasound enhancing agent through an IV if needed to better visualize your heart during the echo. This procedure takes approximately one hour.  There are no restrictions for this procedure.  This  will take place at [address] [facility]) [address], [place] [address]  Please note: We ask at that you not bring children with you during ultrasound (echo/ vascular) testing. Due to room size and safety concerns, children are not allowed in the ultrasound rooms during exams. Our front office staff cannot provide observation of children in our lobby area while testing is being conducted. An adult accompanying a patient to their appointment will only be allowed in the ultrasound room at the discretion of the ultrasound technician under special circumstances. We apologize for any inconvenience.   Follow-Up: At [facility], you and your health needs are our priority.  As part of our continuing mission to provide you with exceptional heart care, our providers are all part of one team.  This team includes your primary Cardiologist (physician) and Advanced Practice Providers or APPs (Physician Assistants and Nurse Practitioners) who all work together to provide you with the care you need, when you need it.  Your next appointment:   Follow up after testing  We recommend signing up for the patient portal called MyChart.  Sign up information is provided on this After Visit Summary.  MyChart is used to connect with patients for Virtual Visits (Telemedicine).  Patients are able to view lab/test results, encounter notes, upcoming appointments, etc.  Non-urgent messages can be sent to your provider as well.   To learn more about what you  can do with MyChart, go to [email or web address].   Other Instructions Your provider has ordered a Lexiscan Myoview  Stress test. This will take place at [facility]. Please report to the [facility] medical mall entrance. The volunteers at the first desk will direct you where to go.  [facility] MYOVIEW   Your provider has ordered a Stress Test with nuclear imaging. The purpose of this test is to evaluate the blood supply to your heart muscle. This procedure is referred  to as a Non-Invasive Stress Test. This is because other than having an IV started in your vein, nothing is inserted or invades your body. Cardiac stress tests are done to find areas of poor blood flow to the heart by determining the extent of coronary artery disease (CAD). Some patients exercise on a treadmill, which naturally increases the blood flow to your heart, while others who are unable to walk on a treadmill due to physical limitations will have a pharmacologic/chemical stress agent called Lexiscan . This medicine will mimic walking on a treadmill by temporarily increasing your coronary blood flow.   Please note: these test may take anywhere between 2-4 hours to complete  How to prepare for your Myoview  test:  Nothing to eat for 6 hours prior to the test No caffeine for 24 hours prior to test No smoking 24 hours prior to test. Your medication may be taken with water.  If your doctor stopped a medication because of this test, do not take that medication. Ladies, please do not wear dresses.  Skirts or pants are appropriate. Please wear a short sleeve shirt. No perfume, cologne or lotion. Wear comfortable walking shoes. No heels!   PLEASE NOTIFY THE OFFICE AT LEAST 24 HOURS IN ADVANCE IF YOU ARE UNABLE TO KEEP YOUR APPOINTMENT.  [phone] AND  PLEASE NOTIFY NUCLEAR MEDICINE AT [facility] AT LEAST 24 HOURS IN ADVANCE IF YOU ARE UNABLE TO KEEP YOUR APPOINTMENT. [phone]

## 2023-09-27 NOTE — Progress Notes (Unsigned)
 Cardiology Office Note   Date:  09/27/2023   ID:  CLEOTIS SPARR, DOB October 10, 1960, MRN [id number]  PCP:  Gasper Nancyann BRAVO, MD  Cardiologist:   Deatrice Cage, MD   Chief Complaint  Patient presents with   New Patient (Initial Visit)    WPW/Syncope. Meds reviewed verbally with pt.      History of Present Illness: Howard Young is a 63 y.o. male who was referred by Dr. Gasper for evaluation of syncope. He has past medical history of diabetes mellitus since 2001, chronic kidney disease, essential hypertension and hyperlipidemia. He was seen by Dr. Monette in 2017 for palpitations and essential hypertension.  He had a Holter monitor done which showed short runs of SVT.  His EKG was abnormal with incomplete bundle branch block.  Exercise Myoview  showed no evidence of ischemia.  Echocardiogram showed normal LV systolic function with no significant valvular abnormalities.  He tries to go to the gym daily and noticed some exertional dyspnea but no chest pain.  He is mostly bothered by episodes of dizziness mostly during the nighttime with sensation of shortness of breath and heaviness in his shoulders.  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 He reports 4-5 episodes of syncope in the last few years mostly during the nighttime.  He had an outpatient ZIO monitor in February 2024 which showed 1 run of ventricular tachycardia lasting 5 beats and 15 runs of supraventricular tachycardia the longest lasted 10 seconds.   Past Medical History:  Diagnosis Date   Barrett esophagus    Diabetes mellitus without complication (HCC)    GERD (gastroesophageal reflux disease)    Hypertension    Morbid obesity (HCC)    Pneumonia    Sleep apnea     Past Surgical History:  Procedure Laterality Date   BREATH TEK H PYLORI  04/25/2011   Procedure: BREATH TEK H PYLORI;  Surgeon: Donnice KATHEE Lunger, MD;  Location: [facility];  Service: General;  Laterality: N/A;   CATARACT EXTRACTION,  BILATERAL  2002   CHOLECYSTECTOMY  2001   COLONOSCOPY WITH PROPOFOL  N/A 03/08/2020   Procedure: COLONOSCOPY WITH PROPOFOL ;  Surgeon: Unk Corinn Skiff, MD;  Location: [facility];  Service: Gastroenterology;  Laterality: N/A;   ESOPHAGOGASTRODUODENOSCOPY (EGD) WITH PROPOFOL   03/08/2020   Procedure: ESOPHAGOGASTRODUODENOSCOPY (EGD) WITH PROPOFOL ;  Surgeon: Unk Corinn Skiff, MD;  Location: [facility];  Service: Gastroenterology;;   heart ablation  1993   JOINT REPLACEMENT  2012   right hip   LAPAROSCOPIC GASTRIC BANDING  2008   MASS EXCISION N/A 04/14/2020   Procedure: EXCISION MASS (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 [facility]   MOHS SURGERY  2014   Face. [facility]   SCROTAL EXPLORATION N/A 11/09/2016   Procedure: SCROTUM EXPLORATION;  Surgeon: Chauncey Redell Agent, MD;  Location: [facility];  Service: Urology;  Laterality: N/A;     Current Outpatient Medications  Medication Sig Dispense Refill   Armodafinil  150 MG tablet TAKE 1 TABLET BY MOUTH EVERY DAY 30 tablet 5   B-D UF III MINI PEN NEEDLES 31G X 5 MM MISC SMARTSIG:1 Each SUB-Q Twice Daily     Berberine-RedIsoAlphaAcd-D-K (OSTERA) TABS Take by mouth.     Continuous Blood Gluc Transmit (DEXCOM G6 TRANSMITTER) MISC USE TO MONITOR BLOOD SUGAR. REPLACE EVERY 3 MONTHS  Continuous Glucose Sensor (DEXCOM G7 SENSOR) MISC SMARTSIG:1 Each SUB-Q Once a Month     Insulin  Pen Needle (B-D UF III MINI PEN NEEDLES) 31G X 5 MM MISC See admin instructions.     lisinopril -hydrochlorothiazide  (ZESTORETIC ) 20-12.5 MG tablet Take 2 tablets by mouth daily.     metFORMIN  (GLUCOPHAGE ) 500 MG tablet Take 1,000 mg by mouth 2 (two) times daily.     metFORMIN  (GLUCOPHAGE -XR) 500 MG 24 hr tablet Take 1,000 mg by mouth 2 (two) times daily.     Multiple Vitamin (MULTIVITAMIN WITH MINERALS) TABS tablet Take 1 tablet by mouth  daily.     OZEMPIC, 0.25 OR 0.5 MG/DOSE, 2 MG/1.5ML SOPN Inject into the skin.     rosuvastatin  (CRESTOR ) 5 MG tablet Take 1 tablet (5 mg total) by mouth daily. 90 tablet 1   tamsulosin  (FLOMAX ) 0.4 MG CAPS capsule TAKE 1 CAPSULE BY MOUTH  DAILY 90 capsule 3   TOUJEO  SOLOSTAR 300 UNIT/ML Solostar Pen Inject 70 Units into the skin at bedtime.     trimethoprim -polymyxin b  (POLYTRIM ) ophthalmic solution Place 1 drop into the left eye every 6 (six) hours. 10 mL 0   gabapentin  (NEURONTIN ) 100 MG capsule Take 1 capsule (100 mg total) by mouth 2 (two) times daily. (Patient not taking: Reported on 09/27/2023) 60 capsule 5   Magnesium 200 MG CHEW Chew 400 mg by mouth daily. (Patient not taking: Reported on 09/27/2023)     tadalafil  (CIALIS ) 10 MG tablet Take 1 tablet (10 mg total) by mouth every other day as needed for erectile dysfunction. (Patient not taking: Reported on 09/27/2023) 10 tablet 1   No current facility-administered medications for this visit.    Allergies:   Metoclopramide hcl, Metoclopramide, and Glipizide    Social History:  The patient  reports that he has never smoked. He has never used smokeless tobacco. He reports that he does not drink alcohol and does not use drugs.   Family History:  The patient's family history includes Cancer in his father, mother, sister, and sister; Diabetes in his mother; Heart failure in his mother.    ROS:  Please see the history of present illness.   Otherwise, review of systems are positive for none.   All other systems are reviewed and negative.    PHYSICAL EXAM: VS:  BP 128/76 (BP Location: Right Arm, Patient Position: Sitting, Cuff Size: Large)   Pulse (!) 56   Ht 6' 4 (1.93 m)   Wt 283 lb 12.8 oz (128.7 kg)   SpO2 99%   BMI 34.55 kg/m  , BMI Body mass index is 34.55 kg/m. GEN: Well nourished, well developed, in no acute distress  HEENT: normal  Neck: no JVD, carotid bruits, or masses Cardiac: RRR; no murmurs, rubs, or gallops,no edema   Respiratory:  clear to auscultation bilaterally, normal work of breathing GI: soft, nontender, nondistended, + BS MS: no deformity or atrophy  Skin: warm and dry, no rash Neuro:  Strength and sensation are intact Psych: euthymic mood, full affect   EKG:  EKG is ordered today. The ekg ordered today demonstrates :  Sinus bradycardia Left axis deviation Left bundle branch block When compared with ECG of 30-Dec-2019 20:11, No significant change was found    Recent Labs: No results found for requested labs within last 365 days.    Lipid Panel    Component Value Date/Time   CHOL 167 04/09/2023 0947   CHOL 196 06/12/2014 1204   TRIG 125 04/09/2023 0947  TRIG 157 (H) 06/12/2014 1204   HDL 52 04/09/2023 0947   HDL 41 06/12/2014 1204   CHOLHDL 2.8 02/04/2018 1131   VLDL 19 02/04/2018 1131   VLDL 31 06/12/2014 1204   LDLCALC 93 04/09/2023 0947   LDLCALC 124 (H) 06/12/2014 1204   LDLDIRECT 94 04/09/2023 0947      Wt Readings from Last 3 Encounters:  09/27/23 283 lb 12.8 oz (128.7 kg)  06/11/23 271 lb 12.8 oz (123.3 kg)  04/09/23 271 lb 12.8 oz (123.3 kg)          09/27/2023   10:08 AM 09/20/2023    5:05 PM  PAD Screen  Previous PAD dx? No No  Previous surgical procedure? No Yes  Dates of procedures  2001. Gallbladder removal  Pain with walking? No No  Feet/toe relief with dangling? No No  Painful, non-healing ulcers? No No  Extremities discolored? Yes Yes      ASSESSMENT AND PLAN:  1.  Dizziness and syncope: He is not significantly orthostatic today.  However, he describes significant symptoms of orthostatic dizziness.  Thus, diuretics are not ideal.  He is on hydrochlorothiazide  for essential hypertension.  I elected to stop hydrochlorothiazide  and increase his lisinopril  to 40 mg daily.  Outpatient monitor showed short runs of SVT and 1 run of nonsustained ventricular tachycardia.  These are not enough to explain his syncopal episode.  2.  Abnormal EKG with  nonsustained ventricular tachycardia: He has underlying left bundle branch block and he has been diabetic for many years.  We have to exclude underlying ischemia.  Recommend evaluation with Lexiscan Myoview .  Will also obtain an echocardiogram to ensure no structural heart abnormalities.  3.  Essential hypertension: I changed his medications as outlined above.  4.  Hyperlipidemia: He is on rosuvastatin  5 mg once daily.  Most recent lipid profile showed an LDL of 94.  Given that he is diabetic, recommend more aggressive treatment of hyperlipidemia.  I increase his rosuvastatin  to 20 mg daily.  5.  Chronic kidney disease: Most recent creatinine was 2.3 with a GFR of 31.  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     Disposition:   FU after cardiac testing.  Signed,  Deatrice Cage, MD  09/27/2023 10:17 AM    [facility]

## 2023-10-08 ENCOUNTER — Other Ambulatory Visit

## 2023-10-12 ENCOUNTER — Encounter: Admission: RE | Admit: 2023-10-12 | Source: Ambulatory Visit

## 2023-10-16 ENCOUNTER — Telehealth: Payer: Self-pay | Admitting: Cardiovascular Disease

## 2023-10-16 NOTE — Telephone Encounter (Signed)
 error

## 2023-11-12 ENCOUNTER — Ambulatory Visit: Attending: Cardiovascular Disease

## 2023-11-12 DIAGNOSIS — I447 Left bundle-branch block, unspecified: Secondary | ICD-10-CM | POA: Diagnosis not present

## 2023-11-12 DIAGNOSIS — E785 Hyperlipidemia, unspecified: Secondary | ICD-10-CM | POA: Diagnosis not present

## 2023-11-12 LAB — ECHOCARDIOGRAM COMPLETE
AR max vel: 4.1 cm2
AV Area VTI: 4.2 cm2
AV Area mean vel: 3.91 cm2
AV Mean grad: 3 mmHg
AV Peak grad: 5.5 mmHg
Ao pk vel: 1.17 m/s
Area-P 1/2: 3.08 cm2
S' Lateral: 3.2 cm

## 2023-11-13 LAB — COMPREHENSIVE METABOLIC PANEL WITH GFR
ALT: 47 IU/L — ABNORMAL HIGH (ref 0–44)
AST: 61 IU/L — ABNORMAL HIGH (ref 0–40)
Albumin: 4.2 g/dL (ref 3.9–4.9)
Alkaline Phosphatase: 49 IU/L (ref 47–123)
BUN/Creatinine Ratio: 12 (ref 10–24)
BUN: 19 mg/dL (ref 8–27)
Bilirubin Total: 1.1 mg/dL (ref 0.0–1.2)
CO2: 21 mmol/L (ref 20–29)
Calcium: 9.5 mg/dL (ref 8.6–10.2)
Chloride: 103 mmol/L (ref 96–106)
Creatinine, Ser: 1.62 mg/dL — ABNORMAL HIGH (ref 0.76–1.27)
Globulin, Total: 2.6 g/dL (ref 1.5–4.5)
Glucose: 127 mg/dL — ABNORMAL HIGH (ref 70–99)
Potassium: 4.8 mmol/L (ref 3.5–5.2)
Sodium: 139 mmol/L (ref 134–144)
Total Protein: 6.8 g/dL (ref 6.0–8.5)
eGFR: 47 mL/min/1.73 — ABNORMAL LOW (ref >59)

## 2023-11-13 LAB — LIPID PANEL
Chol/HDL Ratio: 2.6 ratio (ref 0.0–5.0)
Cholesterol, Total: 142 mg/dL (ref 100–199)
HDL: 55 mg/dL (ref >39)
LDL Chol Calc (NIH): 69 mg/dL (ref 0–99)
Triglycerides: 98 mg/dL (ref 0–149)
VLDL Cholesterol Cal: 18 mg/dL (ref 5–40)

## 2023-11-14 ENCOUNTER — Other Ambulatory Visit: Payer: Self-pay | Admitting: Family Medicine

## 2023-11-14 DIAGNOSIS — G4726 Circadian rhythm sleep disorder, shift work type: Secondary | ICD-10-CM

## 2023-11-16 ENCOUNTER — Ambulatory Visit: Payer: Self-pay | Admitting: Cardiovascular Disease

## 2023-11-23 ENCOUNTER — Ambulatory Visit: Attending: Cardiovascular Disease | Admitting: Cardiovascular Disease

## 2023-11-23 ENCOUNTER — Encounter: Payer: Self-pay | Admitting: Cardiovascular Disease

## 2023-11-23 VITALS — BP 140/82 | HR 54 | Ht 76.0 in | Wt 286.0 lb

## 2023-11-23 DIAGNOSIS — I447 Left bundle-branch block, unspecified: Secondary | ICD-10-CM | POA: Diagnosis not present

## 2023-11-23 DIAGNOSIS — I10 Essential (primary) hypertension: Secondary | ICD-10-CM | POA: Diagnosis not present

## 2023-11-23 DIAGNOSIS — R55 Syncope and collapse: Secondary | ICD-10-CM | POA: Diagnosis not present

## 2023-11-23 DIAGNOSIS — E785 Hyperlipidemia, unspecified: Secondary | ICD-10-CM

## 2023-11-23 NOTE — Patient Instructions (Signed)

## 2023-11-23 NOTE — Progress Notes (Signed)
 Cardiology Office Note   Date:  11/23/2023   ID:  Howard Young, DOB [birth year], MRN [id number]  PCP:  Howard Nancyann BRAVO, MD  Cardiologist:   Howard Cage, MD   Chief Complaint  Patient presents with   Follow-up    Follow up echo no complaints today. Meds reviewed verbally with pt.      History of Present Illness: Howard Young is a 63 y.o. male who is here today for follow-up visit regarding syncope.   He has past medical history of diabetes mellitus since 2001, chronic kidney disease, essential hypertension and hyperlipidemia. 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 He was seen by Dr. Monette in 2017 for palpitations and essential hypertension.  He had a Holter monitor done which showed short runs of SVT.  His EKG was abnormal with incomplete bundle branch block.  Exercise Myoview  showed no evidence of ischemia.  Echocardiogram showed normal LV systolic function with no significant valvular abnormalities.  He had symptoms of orthostatic dizziness and thus I discontinued hydrochlorothiazide  and increase lisinopril . He had an outpatient ZIO monitor in February 2024 which showed 1 run of ventricular tachycardia lasting 5 beats and 15 runs of supraventricular tachycardia the longest lasted 10 seconds.  Due to nonsustained ventricular tachycardia and left bundle branch block on EKG, I requested a Lexiscan Myoview  but was canceled by the patient due to inability to afford as he had a $2000 responsibility.  Echocardiogram was done and showed normal LV systolic function with moderate left ventricular hypertrophy and no significant valvular abnormalities.  He is doing well overall with no chest pain, shortness of breath or syncope.  He walks on a regular basis for exercise.  He walked 3-1/2 miles yesterday with no symptoms.   Past Medical History:  Diagnosis Date   Barrett esophagus    Diabetes mellitus without complication (HCC)    GERD (gastroesophageal reflux  disease)    Hypertension    Morbid obesity (HCC)    Pneumonia    Sleep apnea     Past Surgical History:  Procedure Laterality Date   BREATH TEK H PYLORI  04/25/2011   Procedure: BREATH TEK H PYLORI;  Surgeon: Howard KATHEE Lunger, MD;  Location: [facility];  Service: General;  Laterality: N/A;   CATARACT EXTRACTION, BILATERAL  2002   CHOLECYSTECTOMY  2001   COLONOSCOPY WITH PROPOFOL  N/A 03/08/2020   Procedure: COLONOSCOPY WITH PROPOFOL ;  Surgeon: Howard Corinn Skiff, MD;  Location: [facility];  Service: Gastroenterology;  Laterality: N/A;   ESOPHAGOGASTRODUODENOSCOPY (EGD) WITH PROPOFOL   03/08/2020   Procedure: ESOPHAGOGASTRODUODENOSCOPY (EGD) WITH PROPOFOL ;  Surgeon: Howard Corinn Skiff, MD;  Location: [facility];  Service: Gastroenterology;;   heart ablation  1993   JOINT REPLACEMENT  2012   right hip   LAPAROSCOPIC GASTRIC BANDING  2008   MASS EXCISION N/A 04/14/2020   Procedure: EXCISION MASS (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 [facility]   MOHS SURGERY  2014   Face. [facility]   SCROTAL EXPLORATION N/A 11/09/2016   Procedure: SCROTUM EXPLORATION;  Surgeon: Howard Redell Agent, MD;  Location: [facility];  Service: Urology;  Laterality: N/A;     Current Outpatient Medications  Medication Sig Dispense Refill   B-D UF III MINI PEN NEEDLES 31G X 5 MM MISC SMARTSIG:1 Each SUB-Q Twice Daily  Continuous Glucose Sensor (DEXCOM G7 SENSOR) MISC SMARTSIG:1 Each SUB-Q Once a Month     Insulin  Pen Needle (B-D UF III MINI PEN NEEDLES) 31G X 5 MM MISC See admin instructions.     lisinopril  (ZESTRIL ) 40 MG tablet Take 1 tablet (40 mg total) by mouth daily. 90 tablet 1   Magnesium 200 MG CHEW Chew 400 mg by mouth daily.     metFORMIN  (GLUCOPHAGE -XR) 500 MG 24 hr tablet Take 1,000 mg by mouth 2 (two) times daily.     Multiple Vitamin (MULTIVITAMIN WITH MINERALS)  TABS tablet Take 1 tablet by mouth daily.     rosuvastatin  (CRESTOR ) 20 MG tablet Take 1 tablet (20 mg total) by mouth daily. 90 tablet 1   tamsulosin  (FLOMAX ) 0.4 MG CAPS capsule TAKE 1 CAPSULE BY MOUTH  DAILY 90 capsule 3   TOUJEO  SOLOSTAR 300 UNIT/ML Solostar Pen Inject 70 Units into the skin at bedtime. (Patient taking differently: Inject 36 Units into the skin at bedtime.)     Armodafinil  150 MG tablet TAKE 1 TABLET BY MOUTH EVERY DAY (Patient not taking: Reported on 11/23/2023) 30 tablet 3   Berberine-RedIsoAlphaAcd-D-K (OSTERA) TABS Take by mouth. (Patient not taking: Reported on 11/23/2023)     Continuous Blood Gluc Transmit (DEXCOM G6 TRANSMITTER) MISC USE TO MONITOR BLOOD SUGAR. REPLACE EVERY 3 MONTHS (Patient not taking: Reported on 11/23/2023)     gabapentin  (NEURONTIN ) 100 MG capsule Take 1 capsule (100 mg total) by mouth 2 (two) times daily. (Patient not taking: Reported on 11/23/2023) 60 capsule 5   metFORMIN  (GLUCOPHAGE ) 500 MG tablet Take 1,000 mg by mouth 2 (two) times daily. (Patient not taking: Reported on 11/23/2023)     OZEMPIC, 0.25 OR 0.5 MG/DOSE, 2 MG/1.5ML SOPN Inject into the skin. (Patient not taking: Reported on 11/23/2023)     tadalafil  (CIALIS ) 10 MG tablet Take 1 tablet (10 mg total) by mouth every other day as needed for erectile dysfunction. (Patient not taking: Reported on 11/23/2023) 10 tablet 1   trimethoprim -polymyxin b  (POLYTRIM ) ophthalmic solution Place 1 drop into the left eye every 6 (six) hours. (Patient not taking: Reported on 11/23/2023) 10 mL 0   No current facility-administered medications for this visit.    Allergies:   Metoclopramide hcl, Metoclopramide, and Glipizide    Social History:  The patient  reports that he has never smoked. He has never used smokeless tobacco. He reports that he does not drink alcohol and does not use drugs.   Family History:  The patient's family history includes Cancer in his father, mother, sister, and sister;  Diabetes in his mother; Heart failure in his mother.    ROS:  Please see the history of present illness.   Otherwise, review of systems are positive for none.   All other systems are reviewed and negative.    PHYSICAL EXAM: VS:  BP (!) 140/82 (BP Location: Left Arm, Patient Position: Sitting, Cuff Size: Large)   Pulse (!) 54   Ht 6' 4 (1.93 m)   Wt 286 lb (129.7 kg)   SpO2 98%   BMI 34.81 kg/m  , BMI Body mass index is 34.81 kg/m. GEN: Well nourished, well developed, in no acute distress  HEENT: normal  Neck: no JVD, carotid bruits, or masses Cardiac: RRR; no murmurs, rubs, or gallops,no edema  Respiratory:  clear to auscultation bilaterally, normal work of breathing GI: soft, nontender, nondistended, + BS MS: no deformity or atrophy  Skin: warm and dry, no rash Neuro:  Strength and sensation are intact Psych: euthymic mood, full affect   EKG:  EKG is ordered today. The ekg ordered today demonstrates : Sinus bradycardia Left axis deviation Minimal voltage criteria for LVH, may be normal variant ( Cornell product ) Anteroseptal infarct , age undetermined When compared with ECG of 27-Sep-2023 10:03, Left bundle branch block is no longer Present Anteroseptal infarct is now Present    Recent Labs: 11/12/2023: ALT 47; BUN 19; Creatinine, Ser 1.62; Potassium 4.8; Sodium 139    Lipid Panel    Component Value Date/Time   CHOL 142 11/12/2023 1201   CHOL 196 06/12/2014 1204   TRIG 98 11/12/2023 1201   TRIG 157 (H) 06/12/2014 1204   HDL 55 11/12/2023 1201   HDL 41 06/12/2014 1204   CHOLHDL 2.6 11/12/2023 1201   CHOLHDL 2.8 02/04/2018 1131   VLDL 19 02/04/2018 1131   VLDL 31 06/12/2014 1204   LDLCALC 69 11/12/2023 1201   LDLCALC 124 (H) 06/12/2014 1204   LDLDIRECT 94 04/09/2023 0947      Wt Readings from Last 3 Encounters:  11/23/23 286 lb (129.7 kg)  09/27/23 283 lb 12.8 oz (128.7 kg)  06/11/23 271 lb 12.8 oz (123.3 kg)          09/27/2023   10:08 AM  09/20/2023    5:05 PM  PAD Screen  Previous PAD dx? No No  Previous surgical procedure? No Yes  Dates of procedures  2001. Gallbladder removal  Pain with walking? No No  Feet/toe relief with dangling? No No  Painful, non-healing ulcers? No No  Extremities discolored? Yes Yes      ASSESSMENT AND PLAN:  1.  Dizziness and syncope: Likely due to orthostatic hypotension.  Hydrochlorothiazide  was discontinued and he seems to be doing well.    2.  Intermittent left bundle branch block with nonsustained ventricular tachycardia: Normal ejection fraction by echo.  Will hold off on ischemic cardiac evaluation given lack of anginal symptoms at the present time.  3.  Essential hypertension: Blood pressure is reasonably controlled.  4.  Hyperlipidemia: I increase his rosuvastatin  to 20 mg daily given that he is diabetic.  Recent lipid profile showed improvement with an LDL of 69.  5.  Chronic kidney disease: Slight improvement recently with most recent creatinine of 1.62.     Disposition:   FU in 6 months.  Signed,  Howard Cage, MD  11/23/2023 10:08 AM    [facility]

## 2023-12-13 ENCOUNTER — Other Ambulatory Visit: Payer: Self-pay | Admitting: Family Medicine

## 2023-12-13 DIAGNOSIS — G4726 Circadian rhythm sleep disorder, shift work type: Secondary | ICD-10-CM

## 2023-12-13 MED ORDER — ARMODAFINIL 150 MG PO TABS: 150.0000 mg | ORAL_TABLET | Freq: Every day | ORAL | 3 refills | Status: AC

## 2023-12-13 NOTE — Addendum Note (Signed)
 Addended by: GASPER NANCYANN BRAVO on: 12/13/2023 03:54 PM   Modules accepted: Orders

## 2023-12-13 NOTE — Telephone Encounter (Unsigned)
 Copied from CRM [id number]. Topic: Clinical - Prescription Issue >> Dec 13, 2023  1:28 PM Sophia H wrote: Reason for CRM: Patient states he has still not been able to get his Armodafinil  150 MG tablet from the pharmacy.  Looks like there may have been an error when the RX was sent over. Please take a look and resend/contact pharmacy if needed. Patient states he cannot go without this medication, needing fixed ASAP. Please advise/lvm or text, Ty # [phone]  Message in chart regarding RX 1:22p 10/30. An error occurred while processing the e-prescribing message.   The message was not sent electronically to the requested pharmacy. Contact the pharmacy about the approved prescription.   Code: 601 - Receiver unable to process  Prescription no longer active    [id number] - [place], [place] - [address]

## 2023-12-17 ENCOUNTER — Telehealth: Payer: Self-pay

## 2023-12-17 ENCOUNTER — Other Ambulatory Visit (HOSPITAL_COMMUNITY): Payer: Self-pay

## 2023-12-17 NOTE — Telephone Encounter (Signed)
 Pharmacy Patient Advocate Encounter   Received notification from Onbase that prior authorization for Armodafinil  150MG  tablets is required/requested.   Insurance verification completed.   The patient is insured through [facility].   Per test claim: PA required; PA submitted to above mentioned insurance via Latent Key/confirmation #/EOC [id number] Status is pending

## 2023-12-18 NOTE — Telephone Encounter (Signed)
 Additional information has been requested from the patient's insurance in order to proceed with the prior authorization request. Requested information has been sent, or form has been filled out and faxed back to [phone]  Phone # [phone]

## 2024-02-26 ENCOUNTER — Telehealth: Payer: Self-pay | Admitting: Pharmacy Technician

## 2024-02-26 ENCOUNTER — Other Ambulatory Visit (HOSPITAL_COMMUNITY): Payer: Self-pay

## 2024-02-26 NOTE — Telephone Encounter (Signed)
 Pharmacy Patient Advocate Encounter   Received notification from [facility] KEY that prior authorization for Armodafinil  150MG  tablets is required/requested.   Insurance verification completed.   The patient is insured through [employer].   Per test claim: PA required; PA submitted to above mentioned insurance via Latent Key/confirmation #/EOC [id number] Status is pending

## 2024-02-26 NOTE — Telephone Encounter (Signed)
 Pharmacy Patient Advocate Encounter  Received notification from [facility] that Prior Authorization for Armodafinil  150MG  tablets has been APPROVED from 02/26/2024 to 02/25/2025. Ran test claim, Copay is $48.55. This test claim was processed through [facility]- copay amounts may vary at other pharmacies due to pharmacy/plan contracts, or as the patient moves through the different stages of their insurance plan.   PA #/Case ID/Reference #: [id number]

## 2024-03-03 ENCOUNTER — Ambulatory Visit: Admitting: Family Medicine

## 2024-03-03 ENCOUNTER — Encounter: Payer: Self-pay | Admitting: Family Medicine

## 2024-03-03 VITALS — BP 136/92 | HR 67 | Resp 16 | Ht 76.0 in | Wt 213.0 lb

## 2024-03-03 DIAGNOSIS — E0840 Diabetes mellitus due to underlying condition with diabetic neuropathy, unspecified: Secondary | ICD-10-CM | POA: Diagnosis not present

## 2024-03-03 DIAGNOSIS — N1832 Chronic kidney disease, stage 3b: Secondary | ICD-10-CM

## 2024-03-03 DIAGNOSIS — Z860101 Personal history of adenomatous and serrated colon polyps: Secondary | ICD-10-CM

## 2024-03-03 DIAGNOSIS — K209 Esophagitis, unspecified without bleeding: Secondary | ICD-10-CM | POA: Diagnosis not present

## 2024-03-03 DIAGNOSIS — Z23 Encounter for immunization: Secondary | ICD-10-CM

## 2024-03-03 DIAGNOSIS — Z8719 Personal history of other diseases of the digestive system: Secondary | ICD-10-CM

## 2024-03-03 DIAGNOSIS — E559 Vitamin D deficiency, unspecified: Secondary | ICD-10-CM

## 2024-03-03 LAB — POCT GLYCOSYLATED HEMOGLOBIN (HGB A1C): Hemoglobin A1C: 8.2 % — AB (ref 4.0–5.6)

## 2024-03-03 MED ORDER — OZEMPIC (0.25 OR 0.5 MG/DOSE) 2 MG/1.5ML ~~LOC~~ SOPN: PEN_INJECTOR | SUBCUTANEOUS | 1 refills | Status: AC

## 2024-03-03 NOTE — Progress Notes (Signed)
 "     Established patient visit   Patient: Howard Young   DOB: Jul 13, 1960   64 y.o. Male  MRN: [id number] Visit Date: 03/03/2024  Today's healthcare provider: Nancyann Perry, MD   Chief Complaint  Patient presents with   Follow-up    F/u on DM   Subjective    Discussed the use of AI scribe software for clinical note transcription with the patient, who gave verbal consent to proceed.  History of Present Illness   Howard Young is a 64 year old male with diabetes who presents for follow-up on his medications and blood sugar management.  He has been experiencing issues with his Dexcom supplies, with the last two sensors failing, the most recent after just one day. Consequently, he has not been using his glucometer or insulin  regularly. He describes a previous episode where he increased his insulin  dose from 36 to 60 units, causing his blood sugar to drop from over 200 to 40 in less than an hour while driving to work. This incident has made him cautious about taking insulin  without knowing his current blood sugar levels.  He has been taking Toujeo  insulin , initially at 36 units, but increased to 40 units due to persistently high blood sugar levels. He has not used Novolog  for a long time and has been unable to refill his Ozempic  prescription due to needing physician authorization. He has not taken Ozempic  for a month but reports no issues when he was using it.  He mentions having a cold about a month ago, which has left him with a persistent cough and mild sore throat, particularly noticeable when talking or singing.  He has a significant family history of cancer, including his mother, father, and sisters, and he has had skin cancer himself. He has not followed up with his dermatologist or rescheduled his appointment with Doctor Cherilyn due to insurance issues. He has not had a colonoscopy or endoscopy since 2022.     Lab Results  Component Value Date   HGBA1C 6.0 02/17/2022    HGBA1C 6.4 01/17/2021   HGBA1C 7.6 (A) 01/07/2020   Lab Results  Component Value Date   NA 139 11/12/2023   K 4.8 11/12/2023   CREATININE 1.62 (H) 11/12/2023   EGFR 47 (L) 11/12/2023   GLUCOSE 127 (H) 11/12/2023   Wt Readings from Last 3 Encounters:  03/03/24 213 lb (96.6 kg)  11/23/23 286 lb (129.7 kg)  09/27/23 283 lb 12.8 oz (128.7 kg)   Lab Results  Component Value Date   CHOL 142 11/12/2023   HDL 55 11/12/2023   LDLCALC 69 11/12/2023   LDLDIRECT 94 04/09/2023   TRIG 98 11/12/2023   CHOLHDL 2.6 11/12/2023     Medications: Outpatient Medications Prior to Visit  Medication Sig   Armodafinil  150 MG tablet Take 1 tablet (150 mg total) by mouth daily.   B-D UF III MINI PEN NEEDLES 31G X 5 MM MISC SMARTSIG:1 Each SUB-Q Twice Daily   Continuous Glucose Sensor (DEXCOM G7 SENSOR) MISC SMARTSIG:1 Each SUB-Q Once a Month   Insulin  Pen Needle (B-D UF III MINI PEN NEEDLES) 31G X 5 MM MISC See admin instructions.   lisinopril  (ZESTRIL ) 40 MG tablet Take 1 tablet (40 mg total) by mouth daily.   Magnesium 200 MG CHEW Chew 400 mg by mouth daily.   metFORMIN  (GLUCOPHAGE ) 500 MG tablet Take 1,000 mg by mouth 2 (two) times daily.   Multiple Vitamin (MULTIVITAMIN WITH MINERALS) TABS tablet Take  1 tablet by mouth daily.   rosuvastatin  (CRESTOR ) 20 MG tablet Take 1 tablet (20 mg total) by mouth daily.   Berberine-RedIsoAlphaAcd-D-K (OSTERA) TABS Take by mouth. (Patient not taking: Reported on 03/03/2024)   Continuous Blood Gluc Transmit (DEXCOM G6 TRANSMITTER) MISC USE TO MONITOR BLOOD SUGAR. REPLACE EVERY 3 MONTHS (Patient not taking: Reported on 03/03/2024)   gabapentin  (NEURONTIN ) 100 MG capsule Take 1 capsule (100 mg total) by mouth 2 (two) times daily. (Patient not taking: Reported on 03/03/2024)   metFORMIN  (GLUCOPHAGE -XR) 500 MG 24 hr tablet Take 1,000 mg by mouth 2 (two) times daily. (Patient not taking: Reported on 03/03/2024)   tadalafil  (CIALIS ) 10 MG tablet Take 1 tablet (10 mg  total) by mouth every other day as needed for erectile dysfunction. (Patient not taking: Reported on 03/03/2024)   TOUJEO  SOLOSTAR 300 UNIT/ML Solostar Pen Inject 70 Units into the skin at bedtime. (Patient not taking: Reported on 03/03/2024)   OZEMPIC , 0.25 OR 0.5 MG/DOSE, 2 MG/1.5ML SOPN Inject into the skin. (Patient not taking: Reported on 03/03/2024)   No facility-administered medications prior to visit.   Review of Systems  Constitutional:  Negative for appetite change, chills and fever.  Respiratory:  Negative for chest tightness, shortness of breath and wheezing.   Cardiovascular:  Negative for chest pain and palpitations.  Gastrointestinal:  Negative for abdominal pain, nausea and vomiting.       Objective    BP (!) 136/92 (BP Location: Left Arm, Patient Position: Sitting, Cuff Size: Large)   Pulse 67   Resp 16   Ht 6' 4 (1.93 m)   Wt 213 lb (96.6 kg)   SpO2 100%   BMI 25.93 kg/m   Physical Exam   General: Appearance:    Well developed, well nourished male in no acute distress  Eyes:    PERRL, conjunctiva/corneas clear, EOM's intact       Lungs:     Clear to auscultation bilaterally, respirations unlabored  Heart:    Normal heart rate. Normal rhythm. No murmurs, rubs, or gallops.    MS:   All extremities are intact.    Neurologic:   Awake, alert, oriented x 3. No apparent focal neurological defect.         Assessment & Plan    1. Chronic kidney disease, stage 3b (HCC) (Primary) Stable on current medications. Continue lisinopril . Anticipate reducing metformin  after getting back on Ozempic  which he tolerated without adverse effects.   2. Diabetes mellitus due to underlying condition with diabetic neuropathy, unspecified whether long term insulin  use (HCC) Has run out of Ozempic  months ago and has stopped Toujeo  since he has not had any Dexcom sensors to make sure he doesn't get hypoglycemic.   Start back on  OZEMPIC , 0.25 OR 0.5 MG/DOSE, 2 MG/1.5ML SOPN; 0.25mg   injected weekly for 4 weeks then increase to 0.5mg  weekly  Dispense: 6 mL; Refill: 1  He anticipates being able to get Dexcom sensors filled soon so he will be able to start back on Toujeo  at that time.   He anticipates schedule a follow up with Dr. Cherilyn soon.   3. Vitamin D  deficiency Check vitamin D  with next lab drawn.   4. History of adenomatous polyp of colon Overdue for follow up colonoscopy.  - Ambulatory referral to Gastroenterology  5. History of Barrett esophagus No Barrett's on last EGD in 2023. Patient is under improvession that he is due to have a follow up EGD.   6. Esophagitis  - Ambulatory  referral to Gastroenterology  7. Need for pneumococcal vaccination - Pneumococcal conjugate vaccine 20-valent (Prevnar 20 )  8. Need for influenza vaccination  - Flu vaccine HIGH DOSE PF(Fluzone Trivalent)      Nancyann Perry, MD  [facility] [phone] (phone) [phone] (fax)  [facility] "

## 2024-03-03 NOTE — Patient Instructions (Signed)
 SABRA  Please review the attached list of medications and notify my office if there are any errors.   . Please bring all of your medications to every appointment so we can make sure that our medication list is the same as yours.

## 2024-03-05 ENCOUNTER — Other Ambulatory Visit (HOSPITAL_COMMUNITY): Payer: Self-pay

## 2024-03-05 ENCOUNTER — Telehealth: Payer: Self-pay | Admitting: Pharmacy Technician

## 2024-03-05 NOTE — Telephone Encounter (Signed)
 Pharmacy Patient Advocate Encounter   Received notification from [facility] KEY that prior authorization for Ozempic  (0.25 or 0.5 MG/DOSE) 2MG /3ML pen-injectors is required/requested.   Insurance verification completed.   The patient is insured through [facility].   Per test claim: PA required; PA started via CoverMyMeds. KEY [id number] . Waiting for clinical questions to populate.

## 2024-03-05 NOTE — Telephone Encounter (Signed)
 Pharmacy Patient Advocate Encounter  Received notification from [employer] that Prior Authorization for Ozempic  (0.25 or 0.5 MG/DOSE) 2MG /3ML pen-injectors has been APPROVED from 03/05/24 to 03/05/25. Ran test claim, Copay is $40.00. This test claim was processed through [facility]- copay amounts may vary at other pharmacies due to pharmacy/plan contracts, or as the patient moves through the different stages of their insurance plan.   PA #/Case ID/Reference #: [id number]

## 2024-03-06 ENCOUNTER — Other Ambulatory Visit (HOSPITAL_COMMUNITY): Payer: Self-pay

## 2024-03-10 ENCOUNTER — Other Ambulatory Visit (HOSPITAL_COMMUNITY): Payer: Self-pay

## 2024-04-14 ENCOUNTER — Encounter: Admitting: Family Medicine
